# Patient Record
Sex: Male | Born: 1965 | Race: Black or African American | Hispanic: No | Marital: Single | Smoking: Never smoker
Health system: Southern US, Community
[De-identification: ages and names within clinical notes are randomized; demographics above are authoritative.]

## PROBLEM LIST (undated history)

## (undated) DIAGNOSIS — K625 Hemorrhage of anus and rectum: Secondary | ICD-10-CM

## (undated) DIAGNOSIS — H3090 Unspecified chorioretinal inflammation, unspecified eye: Secondary | ICD-10-CM

## (undated) DIAGNOSIS — E876 Hypokalemia: Secondary | ICD-10-CM

## (undated) DIAGNOSIS — L518 Other erythema multiforme: Secondary | ICD-10-CM

## (undated) DIAGNOSIS — G5760 Lesion of plantar nerve, unspecified lower limb: Secondary | ICD-10-CM

## (undated) DIAGNOSIS — B20 Human immunodeficiency virus [HIV] disease: Secondary | ICD-10-CM

## (undated) DIAGNOSIS — I10 Essential (primary) hypertension: Secondary | ICD-10-CM

## (undated) DIAGNOSIS — R55 Syncope and collapse: Secondary | ICD-10-CM

## (undated) DIAGNOSIS — IMO0002 Reserved for concepts with insufficient information to code with codable children: Secondary | ICD-10-CM

## (undated) DIAGNOSIS — A630 Anogenital (venereal) warts: Secondary | ICD-10-CM

## (undated) DIAGNOSIS — B1920 Unspecified viral hepatitis C without hepatic coma: Secondary | ICD-10-CM

## (undated) DIAGNOSIS — T7840XA Allergy, unspecified, initial encounter: Secondary | ICD-10-CM

## (undated) DIAGNOSIS — L519 Erythema multiforme, unspecified: Secondary | ICD-10-CM

## (undated) DIAGNOSIS — R933 Abnormal findings on diagnostic imaging of other parts of digestive tract: Secondary | ICD-10-CM

## (undated) HISTORY — DX: Syncope and collapse: R55

## (undated) HISTORY — DX: Hemorrhage of anus and rectum: K62.5

## (undated) HISTORY — DX: Unspecified chorioretinal inflammation, unspecified eye: H30.90

## (undated) HISTORY — DX: Erythema multiforme, unspecified: L51.9

## (undated) HISTORY — DX: Allergy, unspecified, initial encounter: T78.40XA

## (undated) HISTORY — DX: Abnormal findings on diagnostic imaging of other parts of digestive tract: R93.3

## (undated) HISTORY — DX: Hypokalemia: E87.6

## (undated) HISTORY — DX: Anogenital (venereal) warts: A63.0

## (undated) HISTORY — DX: Lesion of plantar nerve, unspecified lower limb: G57.60

## (undated) HISTORY — DX: Other erythema multiforme: L51.8

## (undated) HISTORY — DX: Human immunodeficiency virus (HIV) disease: B20

## (undated) HISTORY — DX: Essential (primary) hypertension: I10

## (undated) HISTORY — DX: Unspecified viral hepatitis C without hepatic coma: B19.20

## (undated) HISTORY — DX: Reserved for concepts with insufficient information to code with codable children: IMO0002

---

## 1999-08-23 ENCOUNTER — Emergency Department (HOSPITAL_COMMUNITY): Admission: EM | Admit: 1999-08-23 | Discharge: 1999-08-23 | Payer: Self-pay | Admitting: Emergency Medicine

## 2001-03-03 ENCOUNTER — Emergency Department (HOSPITAL_COMMUNITY): Admission: EM | Admit: 2001-03-03 | Discharge: 2001-03-03 | Payer: Self-pay | Admitting: Emergency Medicine

## 2001-03-11 ENCOUNTER — Emergency Department (HOSPITAL_COMMUNITY): Admission: EM | Admit: 2001-03-11 | Discharge: 2001-03-11 | Payer: Self-pay | Admitting: Emergency Medicine

## 2004-11-03 ENCOUNTER — Emergency Department (HOSPITAL_COMMUNITY): Admission: EM | Admit: 2004-11-03 | Discharge: 2004-11-03 | Payer: Self-pay | Admitting: Emergency Medicine

## 2004-11-15 ENCOUNTER — Emergency Department (HOSPITAL_COMMUNITY): Admission: EM | Admit: 2004-11-15 | Discharge: 2004-11-15 | Payer: Self-pay | Admitting: *Deleted

## 2005-08-20 ENCOUNTER — Emergency Department (HOSPITAL_COMMUNITY): Admission: EM | Admit: 2005-08-20 | Discharge: 2005-08-20 | Payer: Self-pay | Admitting: Emergency Medicine

## 2005-09-05 ENCOUNTER — Emergency Department (HOSPITAL_COMMUNITY): Admission: EM | Admit: 2005-09-05 | Discharge: 2005-09-05 | Payer: Self-pay | Admitting: Family Medicine

## 2005-09-19 ENCOUNTER — Emergency Department (HOSPITAL_COMMUNITY): Admission: EM | Admit: 2005-09-19 | Discharge: 2005-09-19 | Payer: Self-pay | Admitting: Family Medicine

## 2005-09-24 ENCOUNTER — Emergency Department (HOSPITAL_COMMUNITY): Admission: EM | Admit: 2005-09-24 | Discharge: 2005-09-24 | Payer: Self-pay | Admitting: Emergency Medicine

## 2005-09-29 ENCOUNTER — Emergency Department (HOSPITAL_COMMUNITY): Admission: EM | Admit: 2005-09-29 | Discharge: 2005-09-29 | Payer: Self-pay | Admitting: Family Medicine

## 2005-09-30 ENCOUNTER — Ambulatory Visit: Payer: Self-pay | Admitting: Internal Medicine

## 2005-10-03 ENCOUNTER — Emergency Department (HOSPITAL_COMMUNITY): Admission: EM | Admit: 2005-10-03 | Discharge: 2005-10-03 | Payer: Self-pay | Admitting: Emergency Medicine

## 2005-10-04 ENCOUNTER — Ambulatory Visit: Payer: Self-pay | Admitting: Gastroenterology

## 2005-10-08 ENCOUNTER — Encounter (INDEPENDENT_AMBULATORY_CARE_PROVIDER_SITE_OTHER): Payer: Self-pay | Admitting: *Deleted

## 2005-10-08 ENCOUNTER — Encounter (INDEPENDENT_AMBULATORY_CARE_PROVIDER_SITE_OTHER): Payer: Self-pay | Admitting: Specialist

## 2005-10-08 ENCOUNTER — Ambulatory Visit: Payer: Self-pay | Admitting: Gastroenterology

## 2005-10-21 ENCOUNTER — Ambulatory Visit: Payer: Self-pay | Admitting: Internal Medicine

## 2005-11-06 ENCOUNTER — Ambulatory Visit: Payer: Self-pay | Admitting: Gastroenterology

## 2005-11-09 ENCOUNTER — Emergency Department (HOSPITAL_COMMUNITY): Admission: EM | Admit: 2005-11-09 | Discharge: 2005-11-09 | Payer: Self-pay | Admitting: Family Medicine

## 2005-11-09 ENCOUNTER — Emergency Department (HOSPITAL_COMMUNITY): Admission: EM | Admit: 2005-11-09 | Discharge: 2005-11-09 | Payer: Self-pay | Admitting: Emergency Medicine

## 2005-11-11 ENCOUNTER — Emergency Department (HOSPITAL_COMMUNITY): Admission: EM | Admit: 2005-11-11 | Discharge: 2005-11-11 | Payer: Self-pay | Admitting: *Deleted

## 2005-12-24 ENCOUNTER — Ambulatory Visit: Payer: Self-pay | Admitting: Gastroenterology

## 2006-01-17 ENCOUNTER — Encounter (INDEPENDENT_AMBULATORY_CARE_PROVIDER_SITE_OTHER): Payer: Self-pay | Admitting: *Deleted

## 2006-01-17 ENCOUNTER — Ambulatory Visit: Payer: Self-pay | Admitting: Gastroenterology

## 2006-02-06 DIAGNOSIS — B20 Human immunodeficiency virus [HIV] disease: Secondary | ICD-10-CM | POA: Insufficient documentation

## 2006-02-12 ENCOUNTER — Inpatient Hospital Stay (HOSPITAL_COMMUNITY): Admission: EM | Admit: 2006-02-12 | Discharge: 2006-02-17 | Payer: Self-pay | Admitting: *Deleted

## 2006-02-12 ENCOUNTER — Ambulatory Visit: Payer: Self-pay | Admitting: Gastroenterology

## 2006-02-12 ENCOUNTER — Encounter (INDEPENDENT_AMBULATORY_CARE_PROVIDER_SITE_OTHER): Payer: Self-pay | Admitting: *Deleted

## 2006-02-12 ENCOUNTER — Ambulatory Visit: Payer: Self-pay | Admitting: Infectious Diseases

## 2006-02-20 ENCOUNTER — Emergency Department (HOSPITAL_COMMUNITY): Admission: EM | Admit: 2006-02-20 | Discharge: 2006-02-20 | Payer: Self-pay | Admitting: Emergency Medicine

## 2006-03-04 ENCOUNTER — Encounter (INDEPENDENT_AMBULATORY_CARE_PROVIDER_SITE_OTHER): Payer: Self-pay | Admitting: *Deleted

## 2006-03-04 ENCOUNTER — Ambulatory Visit: Payer: Self-pay | Admitting: Infectious Diseases

## 2006-03-04 ENCOUNTER — Encounter: Admission: RE | Admit: 2006-03-04 | Discharge: 2006-03-04 | Payer: Self-pay | Admitting: Infectious Diseases

## 2006-03-04 LAB — CONVERTED CEMR LAB
CD4 Count: 10 microliters
CD4 T Cell Abs: 10

## 2006-03-26 ENCOUNTER — Ambulatory Visit: Payer: Self-pay | Admitting: Infectious Diseases

## 2006-04-09 ENCOUNTER — Ambulatory Visit: Payer: Self-pay | Admitting: Infectious Diseases

## 2006-04-23 ENCOUNTER — Ambulatory Visit: Payer: Self-pay | Admitting: Infectious Diseases

## 2006-05-27 ENCOUNTER — Ambulatory Visit: Payer: Self-pay | Admitting: Gastroenterology

## 2006-06-09 ENCOUNTER — Ambulatory Visit: Payer: Self-pay | Admitting: Infectious Diseases

## 2006-06-09 ENCOUNTER — Encounter (INDEPENDENT_AMBULATORY_CARE_PROVIDER_SITE_OTHER): Payer: Self-pay | Admitting: *Deleted

## 2006-06-09 ENCOUNTER — Encounter: Admission: RE | Admit: 2006-06-09 | Discharge: 2006-06-09 | Payer: Self-pay | Admitting: Infectious Diseases

## 2006-06-09 LAB — CONVERTED CEMR LAB: CD4 Count: 130 microliters

## 2006-09-08 ENCOUNTER — Ambulatory Visit: Payer: Self-pay | Admitting: Infectious Diseases

## 2006-09-08 ENCOUNTER — Encounter (INDEPENDENT_AMBULATORY_CARE_PROVIDER_SITE_OTHER): Payer: Self-pay | Admitting: *Deleted

## 2006-09-08 ENCOUNTER — Encounter: Admission: RE | Admit: 2006-09-08 | Discharge: 2006-09-08 | Payer: Self-pay | Admitting: Infectious Diseases

## 2006-09-08 LAB — CONVERTED CEMR LAB
ALT: 40 units/L (ref 0–40)
AST: 32 units/L (ref 0–37)
Albumin: 4.2 g/dL (ref 3.5–5.2)
Alkaline Phosphatase: 112 units/L (ref 39–117)
BUN: 10 mg/dL (ref 6–23)
CD4 Count: 60 microliters
CO2: 22 meq/L (ref 19–32)
Calcium: 9.2 mg/dL (ref 8.4–10.5)
Chloride: 106 meq/L (ref 96–112)
Creatinine, Ser: 0.79 mg/dL (ref 0.40–1.50)
Glucose, Bld: 86 mg/dL (ref 70–99)
HCT: 38.9 % — ABNORMAL LOW (ref 39.0–52.0)
HIV 1 RNA Quant: 47100 copies/mL
HIV 1 RNA Quant: 47100 copies/mL — ABNORMAL HIGH (ref <50)
HIV-1 RNA Quant, Log: 4.67 — ABNORMAL HIGH (ref <1.70)
Hemoglobin: 12.8 g/dL — ABNORMAL LOW (ref 13.0–17.0)
Leukocyte count, blood: 2.5 10*9/L — ABNORMAL LOW (ref 4.0–10.5)
MCHC: 32.9 g/dL (ref 30.0–36.0)
MCV: 87 fL (ref 78.0–100.0)
Platelets: 276 10*3/uL (ref 150–400)
Potassium: 3.9 meq/L (ref 3.5–5.3)
RBC: 4.47 M/uL (ref 4.22–5.81)
RDW: 17.6 % — ABNORMAL HIGH (ref 11.5–14.0)
Sodium: 141 meq/L (ref 135–145)
Total Bilirubin: 0.3 mg/dL (ref 0.3–1.2)
Total Protein: 7.6 g/dL (ref 6.0–8.3)

## 2006-09-24 ENCOUNTER — Encounter: Admission: RE | Admit: 2006-09-24 | Discharge: 2006-09-24 | Payer: Self-pay | Admitting: Infectious Diseases

## 2006-09-24 ENCOUNTER — Encounter (INDEPENDENT_AMBULATORY_CARE_PROVIDER_SITE_OTHER): Payer: Self-pay | Admitting: *Deleted

## 2006-09-24 ENCOUNTER — Ambulatory Visit: Payer: Self-pay | Admitting: Infectious Diseases

## 2006-09-24 LAB — CONVERTED CEMR LAB
CD4 Count: 90 microliters
HIV 1 RNA Quant: 63600 copies/mL
HIV 1 RNA Quant: 63600 copies/mL — ABNORMAL HIGH (ref <50)
HIV-1 RNA Quant, Log: 4.8 — ABNORMAL HIGH (ref <1.70)

## 2006-09-27 DIAGNOSIS — Z9119 Patient's noncompliance with other medical treatment and regimen: Secondary | ICD-10-CM

## 2006-09-27 DIAGNOSIS — A630 Anogenital (venereal) warts: Secondary | ICD-10-CM | POA: Insufficient documentation

## 2006-09-27 DIAGNOSIS — B1920 Unspecified viral hepatitis C without hepatic coma: Secondary | ICD-10-CM

## 2006-10-15 ENCOUNTER — Ambulatory Visit: Payer: Self-pay | Admitting: Infectious Diseases

## 2006-12-29 ENCOUNTER — Encounter: Admission: RE | Admit: 2006-12-29 | Discharge: 2006-12-29 | Payer: Self-pay | Admitting: Infectious Diseases

## 2006-12-29 ENCOUNTER — Encounter (INDEPENDENT_AMBULATORY_CARE_PROVIDER_SITE_OTHER): Payer: Self-pay | Admitting: *Deleted

## 2006-12-29 ENCOUNTER — Ambulatory Visit (HOSPITAL_COMMUNITY): Admission: RE | Admit: 2006-12-29 | Discharge: 2006-12-29 | Payer: Self-pay | Admitting: Infectious Diseases

## 2006-12-29 ENCOUNTER — Ambulatory Visit: Payer: Self-pay | Admitting: Infectious Diseases

## 2006-12-29 LAB — CONVERTED CEMR LAB
ALT: 50 units/L (ref 0–53)
AST: 30 units/L (ref 0–37)
Albumin: 4.6 g/dL (ref 3.5–5.2)
Alkaline Phosphatase: 105 units/L (ref 39–117)
BUN: 11 mg/dL (ref 6–23)
CO2: 23 meq/L (ref 19–32)
Calcium: 9.4 mg/dL (ref 8.4–10.5)
Chloride: 106 meq/L (ref 96–112)
Creatinine, Ser: 0.85 mg/dL (ref 0.40–1.50)
Glucose, Bld: 88 mg/dL (ref 70–99)
HCT: 39.3 % (ref 39.0–52.0)
HIV 1 RNA Quant: 170 copies/mL
HIV 1 RNA Quant: 170 copies/mL — ABNORMAL HIGH (ref <50)
HIV-1 RNA Quant, Log: 2.23 — ABNORMAL HIGH (ref <1.70)
Hemoglobin: 13.4 g/dL (ref 13.0–17.0)
MCHC: 34.1 g/dL (ref 30.0–36.0)
MCV: 99.7 fL (ref 78.0–100.0)
Platelets: 329 10*3/uL (ref 150–400)
Potassium: 3.8 meq/L (ref 3.5–5.3)
RBC: 3.94 M/uL — ABNORMAL LOW (ref 4.22–5.81)
RDW: 19.5 % — ABNORMAL HIGH (ref 11.5–14.0)
Sodium: 139 meq/L (ref 135–145)
TSH: 1.564 microintl units/mL (ref 0.350–5.50)
Total Bilirubin: 1.3 mg/dL — ABNORMAL HIGH (ref 0.3–1.2)
Total Protein: 7.9 g/dL (ref 6.0–8.3)
WBC: 5 10*3/uL (ref 4.0–10.5)

## 2007-01-12 ENCOUNTER — Encounter (INDEPENDENT_AMBULATORY_CARE_PROVIDER_SITE_OTHER): Payer: Self-pay | Admitting: *Deleted

## 2007-01-12 ENCOUNTER — Encounter: Payer: Self-pay | Admitting: Infectious Diseases

## 2007-01-12 ENCOUNTER — Ambulatory Visit: Payer: Self-pay | Admitting: Infectious Diseases

## 2007-01-12 DIAGNOSIS — R55 Syncope and collapse: Secondary | ICD-10-CM

## 2007-01-12 LAB — CONVERTED CEMR LAB: HCV Quantitative: 2520000 intl units/mL

## 2007-01-25 ENCOUNTER — Encounter (INDEPENDENT_AMBULATORY_CARE_PROVIDER_SITE_OTHER): Payer: Self-pay | Admitting: *Deleted

## 2007-01-29 ENCOUNTER — Telehealth (INDEPENDENT_AMBULATORY_CARE_PROVIDER_SITE_OTHER): Payer: Self-pay | Admitting: Infectious Diseases

## 2007-02-09 ENCOUNTER — Telehealth: Payer: Self-pay | Admitting: Infectious Diseases

## 2007-02-24 ENCOUNTER — Telehealth: Payer: Self-pay | Admitting: Infectious Diseases

## 2007-03-09 ENCOUNTER — Emergency Department (HOSPITAL_COMMUNITY): Admission: EM | Admit: 2007-03-09 | Discharge: 2007-03-09 | Payer: Self-pay | Admitting: Emergency Medicine

## 2007-03-10 ENCOUNTER — Telehealth: Payer: Self-pay | Admitting: Infectious Diseases

## 2007-03-25 ENCOUNTER — Ambulatory Visit: Payer: Self-pay | Admitting: Infectious Diseases

## 2007-04-09 ENCOUNTER — Telehealth: Payer: Self-pay | Admitting: Infectious Diseases

## 2007-05-08 ENCOUNTER — Telehealth: Payer: Self-pay | Admitting: Infectious Diseases

## 2007-06-04 ENCOUNTER — Encounter: Payer: Self-pay | Admitting: Infectious Diseases

## 2007-06-09 ENCOUNTER — Telehealth: Payer: Self-pay | Admitting: Infectious Diseases

## 2007-06-22 ENCOUNTER — Telehealth: Payer: Self-pay | Admitting: Infectious Diseases

## 2007-07-08 ENCOUNTER — Telehealth: Payer: Self-pay | Admitting: Infectious Diseases

## 2007-08-11 ENCOUNTER — Telehealth: Payer: Self-pay | Admitting: Infectious Diseases

## 2007-09-08 ENCOUNTER — Telehealth: Payer: Self-pay | Admitting: Infectious Diseases

## 2007-09-08 ENCOUNTER — Encounter: Admission: RE | Admit: 2007-09-08 | Discharge: 2007-09-08 | Payer: Self-pay | Admitting: Infectious Diseases

## 2007-09-08 ENCOUNTER — Ambulatory Visit: Payer: Self-pay | Admitting: Infectious Diseases

## 2007-09-08 LAB — CONVERTED CEMR LAB
ALT: 41 units/L (ref 0–53)
AST: 31 units/L (ref 0–37)
Albumin: 4.1 g/dL (ref 3.5–5.2)
Alkaline Phosphatase: 71 units/L (ref 39–117)
BUN: 8 mg/dL (ref 6–23)
Basophils Absolute: 0 10*3/uL (ref 0.0–0.1)
Basophils Relative: 0 % (ref 0–1)
CO2: 23 meq/L (ref 19–32)
Calcium: 8.7 mg/dL (ref 8.4–10.5)
Chloride: 108 meq/L (ref 96–112)
Creatinine, Ser: 0.79 mg/dL (ref 0.40–1.50)
Eosinophils Absolute: 0 10*3/uL (ref 0.0–0.7)
Eosinophils Relative: 1 % (ref 0–5)
Glucose, Bld: 91 mg/dL (ref 70–99)
HCT: 40.8 % (ref 39.0–52.0)
HIV 1 RNA Quant: 179000 copies/mL — ABNORMAL HIGH (ref <50)
HIV-1 RNA Quant, Log: 5.25 — ABNORMAL HIGH (ref <1.70)
Hemoglobin: 14.4 g/dL (ref 13.0–17.0)
Lymphocytes Relative: 54 % — ABNORMAL HIGH (ref 12–46)
Lymphs Abs: 2.4 10*3/uL (ref 0.7–3.3)
MCHC: 35.3 g/dL (ref 30.0–36.0)
MCV: 100.7 fL — ABNORMAL HIGH (ref 78.0–100.0)
Monocytes Absolute: 0.6 10*3/uL (ref 0.2–0.7)
Monocytes Relative: 13 % — ABNORMAL HIGH (ref 3–11)
Neutro Abs: 1.4 10*3/uL — ABNORMAL LOW (ref 1.7–7.7)
Neutrophils Relative %: 32 % — ABNORMAL LOW (ref 43–77)
Platelets: 196 10*3/uL (ref 150–400)
Potassium: 3.4 meq/L — ABNORMAL LOW (ref 3.5–5.3)
RBC: 4.05 M/uL — ABNORMAL LOW (ref 4.22–5.81)
RDW: 12.6 % (ref 11.5–14.0)
Sodium: 143 meq/L (ref 135–145)
Total Bilirubin: 1.9 mg/dL — ABNORMAL HIGH (ref 0.3–1.2)
Total Protein: 7.5 g/dL (ref 6.0–8.3)
WBC: 4.4 10*3/uL (ref 4.0–10.5)

## 2007-09-11 ENCOUNTER — Encounter: Payer: Self-pay | Admitting: Infectious Diseases

## 2007-09-11 LAB — CONVERTED CEMR LAB

## 2007-09-21 ENCOUNTER — Telehealth: Payer: Self-pay | Admitting: Infectious Diseases

## 2007-10-05 ENCOUNTER — Telehealth: Payer: Self-pay | Admitting: Infectious Diseases

## 2007-11-05 ENCOUNTER — Telehealth: Payer: Self-pay | Admitting: Infectious Diseases

## 2007-11-26 ENCOUNTER — Encounter (INDEPENDENT_AMBULATORY_CARE_PROVIDER_SITE_OTHER): Payer: Self-pay | Admitting: *Deleted

## 2007-12-09 ENCOUNTER — Telehealth: Payer: Self-pay | Admitting: Infectious Diseases

## 2007-12-21 ENCOUNTER — Telehealth: Payer: Self-pay | Admitting: Infectious Diseases

## 2007-12-25 ENCOUNTER — Encounter (INDEPENDENT_AMBULATORY_CARE_PROVIDER_SITE_OTHER): Payer: Self-pay | Admitting: *Deleted

## 2008-01-07 ENCOUNTER — Telehealth: Payer: Self-pay | Admitting: Infectious Diseases

## 2008-01-20 ENCOUNTER — Telehealth (INDEPENDENT_AMBULATORY_CARE_PROVIDER_SITE_OTHER): Payer: Self-pay | Admitting: *Deleted

## 2008-02-02 ENCOUNTER — Telehealth: Payer: Self-pay | Admitting: Infectious Diseases

## 2008-03-04 ENCOUNTER — Telehealth (INDEPENDENT_AMBULATORY_CARE_PROVIDER_SITE_OTHER): Payer: Self-pay | Admitting: *Deleted

## 2008-03-08 ENCOUNTER — Encounter: Admission: RE | Admit: 2008-03-08 | Discharge: 2008-03-08 | Payer: Self-pay | Admitting: Infectious Diseases

## 2008-03-08 ENCOUNTER — Encounter: Payer: Self-pay | Admitting: Internal Medicine

## 2008-03-08 ENCOUNTER — Ambulatory Visit: Payer: Self-pay | Admitting: Infectious Diseases

## 2008-03-08 LAB — CONVERTED CEMR LAB
HIV 1 RNA Quant: 214000 copies/mL — ABNORMAL HIGH (ref <50)
HIV-1 RNA Quant, Log: 5.33 — ABNORMAL HIGH (ref <1.70)

## 2008-03-09 LAB — CONVERTED CEMR LAB
ALT: 26 units/L (ref 0–53)
AST: 27 units/L (ref 0–37)
Albumin: 4.4 g/dL (ref 3.5–5.2)
Alkaline Phosphatase: 71 units/L (ref 39–117)
BUN: 7 mg/dL (ref 6–23)
Basophils Absolute: 0 10*3/uL (ref 0.0–0.1)
Basophils Relative: 0 % (ref 0–1)
CO2: 22 meq/L (ref 19–32)
Calcium: 8.7 mg/dL (ref 8.4–10.5)
Chloride: 108 meq/L (ref 96–112)
Cholesterol: 113 mg/dL (ref 0–200)
Creatinine, Ser: 0.76 mg/dL (ref 0.40–1.50)
Eosinophils Absolute: 0.4 10*3/uL (ref 0.0–0.7)
Eosinophils Relative: 9 % — ABNORMAL HIGH (ref 0–5)
Glucose, Bld: 114 mg/dL — ABNORMAL HIGH (ref 70–99)
HCT: 42.1 % (ref 39.0–52.0)
HDL: 34 mg/dL — ABNORMAL LOW (ref >39)
Hemoglobin: 15 g/dL (ref 13.0–17.0)
LDL Cholesterol: 29 mg/dL (ref 0–99)
Lymphocytes Relative: 47 % — ABNORMAL HIGH (ref 12–46)
Lymphs Abs: 2.1 10*3/uL (ref 0.7–4.0)
MCHC: 35.6 g/dL (ref 30.0–36.0)
MCV: 95 fL (ref 78.0–100.0)
Monocytes Absolute: 0.5 10*3/uL (ref 0.1–1.0)
Monocytes Relative: 10 % (ref 3–12)
Neutro Abs: 1.5 10*3/uL — ABNORMAL LOW (ref 1.7–7.7)
Neutrophils Relative %: 34 % — ABNORMAL LOW (ref 43–77)
Platelets: 247 10*3/uL (ref 150–400)
Potassium: 3.5 meq/L (ref 3.5–5.3)
RBC: 4.43 M/uL (ref 4.22–5.81)
RDW: 15.2 % (ref 11.5–15.5)
Sodium: 144 meq/L (ref 135–145)
Total Bilirubin: 2.9 mg/dL — ABNORMAL HIGH (ref 0.3–1.2)
Total CHOL/HDL Ratio: 3.3
Total Protein: 7.6 g/dL (ref 6.0–8.3)
Triglycerides: 252 mg/dL — ABNORMAL HIGH (ref <150)
VLDL: 50 mg/dL — ABNORMAL HIGH (ref 0–40)
WBC: 4.5 10*3/uL (ref 4.0–10.5)

## 2008-03-23 ENCOUNTER — Ambulatory Visit: Payer: Self-pay | Admitting: Infectious Diseases

## 2008-03-23 DIAGNOSIS — I10 Essential (primary) hypertension: Secondary | ICD-10-CM | POA: Insufficient documentation

## 2008-03-24 ENCOUNTER — Encounter: Payer: Self-pay | Admitting: Internal Medicine

## 2008-03-24 LAB — CONVERTED CEMR LAB

## 2008-05-02 ENCOUNTER — Telehealth (INDEPENDENT_AMBULATORY_CARE_PROVIDER_SITE_OTHER): Payer: Self-pay | Admitting: *Deleted

## 2008-05-30 ENCOUNTER — Ambulatory Visit: Payer: Self-pay | Admitting: Infectious Diseases

## 2008-05-30 DIAGNOSIS — L255 Unspecified contact dermatitis due to plants, except food: Secondary | ICD-10-CM

## 2008-05-30 DIAGNOSIS — G5760 Lesion of plantar nerve, unspecified lower limb: Secondary | ICD-10-CM | POA: Insufficient documentation

## 2008-05-30 LAB — CONVERTED CEMR LAB
BUN: 9 mg/dL (ref 6–23)
CO2: 22 meq/L (ref 19–32)
Calcium: 9.7 mg/dL (ref 8.4–10.5)
Chloride: 107 meq/L (ref 96–112)
Creatinine, Ser: 0.62 mg/dL (ref 0.40–1.50)
Glucose, Bld: 70 mg/dL (ref 70–99)
Potassium: 3.6 meq/L (ref 3.5–5.3)
Sodium: 143 meq/L (ref 135–145)

## 2008-05-31 ENCOUNTER — Telehealth (INDEPENDENT_AMBULATORY_CARE_PROVIDER_SITE_OTHER): Payer: Self-pay | Admitting: *Deleted

## 2008-06-13 ENCOUNTER — Ambulatory Visit: Payer: Self-pay | Admitting: Infectious Diseases

## 2008-06-16 ENCOUNTER — Telehealth: Payer: Self-pay

## 2008-06-16 ENCOUNTER — Ambulatory Visit: Payer: Self-pay | Admitting: Infectious Diseases

## 2008-06-16 LAB — CONVERTED CEMR LAB
BUN: 8 mg/dL (ref 6–23)
CO2: 20 meq/L (ref 19–32)
Calcium: 8.9 mg/dL (ref 8.4–10.5)
Chloride: 109 meq/L (ref 96–112)
Creatinine, Ser: 0.67 mg/dL (ref 0.40–1.50)
Glucose, Bld: 90 mg/dL (ref 70–99)
Potassium: 3.6 meq/L (ref 3.5–5.3)
Sodium: 142 meq/L (ref 135–145)

## 2008-06-29 ENCOUNTER — Telehealth (INDEPENDENT_AMBULATORY_CARE_PROVIDER_SITE_OTHER): Payer: Self-pay | Admitting: *Deleted

## 2008-08-09 ENCOUNTER — Encounter: Payer: Self-pay | Admitting: Infectious Diseases

## 2008-08-09 LAB — CONVERTED CEMR LAB: Hep A Total Ab: NEGATIVE

## 2008-08-23 ENCOUNTER — Telehealth (INDEPENDENT_AMBULATORY_CARE_PROVIDER_SITE_OTHER): Payer: Self-pay | Admitting: *Deleted

## 2008-09-14 ENCOUNTER — Ambulatory Visit: Payer: Self-pay | Admitting: Infectious Diseases

## 2008-09-14 LAB — CONVERTED CEMR LAB
ALT: 42 units/L (ref 0–53)
AST: 44 units/L — ABNORMAL HIGH (ref 0–37)
Albumin: 4.2 g/dL (ref 3.5–5.2)
Alkaline Phosphatase: 62 units/L (ref 39–117)
BUN: 8 mg/dL (ref 6–23)
Basophils Absolute: 0 10*3/uL (ref 0.0–0.1)
Basophils Relative: 0 % (ref 0–1)
CO2: 24 meq/L (ref 19–32)
Calcium: 8.7 mg/dL (ref 8.4–10.5)
Chloride: 108 meq/L (ref 96–112)
Creatinine, Ser: 0.67 mg/dL (ref 0.40–1.50)
Eosinophils Absolute: 0.1 10*3/uL (ref 0.0–0.7)
Eosinophils Relative: 4 % (ref 0–5)
Glucose, Bld: 87 mg/dL (ref 70–99)
HCT: 39.4 % (ref 39.0–52.0)
HIV 1 RNA Quant: 548000 copies/mL — ABNORMAL HIGH (ref <50)
HIV-1 RNA Quant, Log: 5.74 — ABNORMAL HIGH (ref <1.70)
Hemoglobin: 13.3 g/dL (ref 13.0–17.0)
Lymphocytes Relative: 30 % (ref 12–46)
Lymphs Abs: 0.9 10*3/uL (ref 0.7–4.0)
MCHC: 33.8 g/dL (ref 30.0–36.0)
MCV: 97.5 fL (ref 78.0–100.0)
Monocytes Absolute: 0.5 10*3/uL (ref 0.1–1.0)
Monocytes Relative: 17 % — ABNORMAL HIGH (ref 3–12)
Neutro Abs: 1.5 10*3/uL — ABNORMAL LOW (ref 1.7–7.7)
Neutrophils Relative %: 49 % (ref 43–77)
Platelets: 209 10*3/uL (ref 150–400)
Potassium: 3.8 meq/L (ref 3.5–5.3)
RBC: 4.04 M/uL — ABNORMAL LOW (ref 4.22–5.81)
RDW: 13.8 % (ref 11.5–15.5)
Sodium: 142 meq/L (ref 135–145)
Total Bilirubin: 1.2 mg/dL (ref 0.3–1.2)
Total Protein: 7.3 g/dL (ref 6.0–8.3)
WBC: 3 10*3/uL — ABNORMAL LOW (ref 4.0–10.5)

## 2008-09-23 ENCOUNTER — Telehealth (INDEPENDENT_AMBULATORY_CARE_PROVIDER_SITE_OTHER): Payer: Self-pay | Admitting: *Deleted

## 2008-09-28 ENCOUNTER — Ambulatory Visit: Payer: Self-pay | Admitting: Infectious Diseases

## 2008-10-25 ENCOUNTER — Telehealth (INDEPENDENT_AMBULATORY_CARE_PROVIDER_SITE_OTHER): Payer: Self-pay | Admitting: *Deleted

## 2008-11-24 ENCOUNTER — Telehealth (INDEPENDENT_AMBULATORY_CARE_PROVIDER_SITE_OTHER): Payer: Self-pay | Admitting: *Deleted

## 2008-12-15 ENCOUNTER — Ambulatory Visit: Payer: Self-pay | Admitting: Infectious Diseases

## 2008-12-15 ENCOUNTER — Encounter: Payer: Self-pay | Admitting: Infectious Diseases

## 2008-12-15 LAB — CONVERTED CEMR LAB
ALT: 28 units/L (ref 0–53)
AST: 38 units/L — ABNORMAL HIGH (ref 0–37)
Albumin: 4 g/dL (ref 3.5–5.2)
Alkaline Phosphatase: 57 units/L (ref 39–117)
BUN: 10 mg/dL (ref 6–23)
Basophils Absolute: 0 10*3/uL (ref 0.0–0.1)
Basophils Relative: 0 % (ref 0–1)
CO2: 23 meq/L (ref 19–32)
Calcium: 8.8 mg/dL (ref 8.4–10.5)
Chloride: 105 meq/L (ref 96–112)
Creatinine, Ser: 0.65 mg/dL (ref 0.40–1.50)
Eosinophils Absolute: 0.1 10*3/uL (ref 0.0–0.7)
Eosinophils Relative: 2 % (ref 0–5)
Glucose, Bld: 98 mg/dL (ref 70–99)
HCT: 34.8 % — ABNORMAL LOW (ref 39.0–52.0)
HIV 1 RNA Quant: 604000 copies/mL — ABNORMAL HIGH (ref <48)
HIV-1 RNA Quant, Log: 5.78 — ABNORMAL HIGH (ref <1.68)
Hemoglobin: 12 g/dL — ABNORMAL LOW (ref 13.0–17.0)
Lymphocytes Relative: 24 % (ref 12–46)
Lymphs Abs: 0.9 10*3/uL (ref 0.7–4.0)
MCHC: 34.5 g/dL (ref 30.0–36.0)
MCV: 97.8 fL (ref 78.0–100.0)
Monocytes Absolute: 0.5 10*3/uL (ref 0.1–1.0)
Monocytes Relative: 13 % — ABNORMAL HIGH (ref 3–12)
Neutro Abs: 2.2 10*3/uL (ref 1.7–7.7)
Neutrophils Relative %: 61 % (ref 43–77)
Platelets: 253 10*3/uL (ref 150–400)
Potassium: 3 meq/L — ABNORMAL LOW (ref 3.5–5.3)
RBC: 3.56 M/uL — ABNORMAL LOW (ref 4.22–5.81)
RDW: 15.2 % (ref 11.5–15.5)
Sodium: 144 meq/L (ref 135–145)
Total Bilirubin: 1.2 mg/dL (ref 0.3–1.2)
Total Protein: 7.3 g/dL (ref 6.0–8.3)
WBC: 3.6 10*3/uL — ABNORMAL LOW (ref 4.0–10.5)

## 2008-12-22 ENCOUNTER — Ambulatory Visit: Payer: Self-pay | Admitting: Infectious Disease

## 2008-12-23 ENCOUNTER — Encounter: Payer: Self-pay | Admitting: Gastroenterology

## 2008-12-23 ENCOUNTER — Ambulatory Visit: Payer: Self-pay | Admitting: Internal Medicine

## 2008-12-23 ENCOUNTER — Inpatient Hospital Stay (HOSPITAL_COMMUNITY): Admission: EM | Admit: 2008-12-23 | Discharge: 2008-12-25 | Payer: Self-pay | Admitting: Emergency Medicine

## 2008-12-25 ENCOUNTER — Encounter (INDEPENDENT_AMBULATORY_CARE_PROVIDER_SITE_OTHER): Payer: Self-pay | Admitting: Internal Medicine

## 2008-12-25 DIAGNOSIS — E876 Hypokalemia: Secondary | ICD-10-CM

## 2008-12-25 DIAGNOSIS — K625 Hemorrhage of anus and rectum: Secondary | ICD-10-CM

## 2008-12-26 ENCOUNTER — Telehealth (INDEPENDENT_AMBULATORY_CARE_PROVIDER_SITE_OTHER): Payer: Self-pay | Admitting: *Deleted

## 2009-01-04 ENCOUNTER — Ambulatory Visit: Payer: Self-pay | Admitting: Infectious Diseases

## 2009-01-04 LAB — CONVERTED CEMR LAB
Basophils Absolute: 0 10*3/uL (ref 0.0–0.1)
Basophils Relative: 0 % (ref 0–1)
Eosinophils Absolute: 0.5 10*3/uL (ref 0.0–0.7)
Eosinophils Relative: 8 % — ABNORMAL HIGH (ref 0–5)
HCT: 33.5 % — ABNORMAL LOW (ref 39.0–52.0)
Hemoglobin: 11.3 g/dL — ABNORMAL LOW (ref 13.0–17.0)
Lymphocytes Relative: 37 % (ref 12–46)
Lymphs Abs: 2 10*3/uL (ref 0.7–4.0)
MCHC: 33.7 g/dL (ref 30.0–36.0)
MCV: 105 fL — ABNORMAL HIGH (ref 78.0–100.0)
Monocytes Absolute: 0.5 10*3/uL (ref 0.1–1.0)
Monocytes Relative: 10 % (ref 3–12)
Neutro Abs: 2.4 10*3/uL (ref 1.7–7.7)
Neutrophils Relative %: 44 % (ref 43–77)
Platelets: 292 10*3/uL (ref 150–400)
RBC: 3.19 M/uL — ABNORMAL LOW (ref 4.22–5.81)
RDW: 18.5 % — ABNORMAL HIGH (ref 11.5–15.5)
WBC: 5.4 10*3/uL (ref 4.0–10.5)

## 2009-01-05 ENCOUNTER — Telehealth (INDEPENDENT_AMBULATORY_CARE_PROVIDER_SITE_OTHER): Payer: Self-pay | Admitting: *Deleted

## 2009-01-05 ENCOUNTER — Encounter (INDEPENDENT_AMBULATORY_CARE_PROVIDER_SITE_OTHER): Payer: Self-pay | Admitting: *Deleted

## 2009-01-13 ENCOUNTER — Ambulatory Visit: Payer: Self-pay | Admitting: Gastroenterology

## 2009-01-13 DIAGNOSIS — R933 Abnormal findings on diagnostic imaging of other parts of digestive tract: Secondary | ICD-10-CM

## 2009-01-20 ENCOUNTER — Ambulatory Visit: Payer: Self-pay | Admitting: Gastroenterology

## 2009-01-20 ENCOUNTER — Encounter: Payer: Self-pay | Admitting: Gastroenterology

## 2009-01-23 ENCOUNTER — Telehealth (INDEPENDENT_AMBULATORY_CARE_PROVIDER_SITE_OTHER): Payer: Self-pay | Admitting: *Deleted

## 2009-01-25 ENCOUNTER — Telehealth: Payer: Self-pay | Admitting: Infectious Diseases

## 2009-01-25 ENCOUNTER — Encounter: Payer: Self-pay | Admitting: Gastroenterology

## 2009-02-06 ENCOUNTER — Ambulatory Visit: Payer: Self-pay | Admitting: Infectious Diseases

## 2009-02-06 DIAGNOSIS — A630 Anogenital (venereal) warts: Secondary | ICD-10-CM | POA: Insufficient documentation

## 2009-02-06 LAB — CONVERTED CEMR LAB
HIV 1 RNA Quant: 37300 copies/mL — ABNORMAL HIGH (ref <48)
HIV-1 RNA Quant, Log: 4.57 — ABNORMAL HIGH (ref <1.68)

## 2009-02-20 ENCOUNTER — Telehealth (INDEPENDENT_AMBULATORY_CARE_PROVIDER_SITE_OTHER): Payer: Self-pay | Admitting: *Deleted

## 2009-03-06 ENCOUNTER — Emergency Department (HOSPITAL_COMMUNITY): Admission: EM | Admit: 2009-03-06 | Discharge: 2009-03-06 | Payer: Self-pay | Admitting: Emergency Medicine

## 2009-03-06 ENCOUNTER — Encounter: Payer: Self-pay | Admitting: Infectious Diseases

## 2009-05-01 ENCOUNTER — Telehealth (INDEPENDENT_AMBULATORY_CARE_PROVIDER_SITE_OTHER): Payer: Self-pay | Admitting: *Deleted

## 2009-05-08 ENCOUNTER — Ambulatory Visit: Payer: Self-pay | Admitting: Infectious Diseases

## 2009-05-15 ENCOUNTER — Telehealth (INDEPENDENT_AMBULATORY_CARE_PROVIDER_SITE_OTHER): Payer: Self-pay | Admitting: *Deleted

## 2009-05-26 ENCOUNTER — Telehealth (INDEPENDENT_AMBULATORY_CARE_PROVIDER_SITE_OTHER): Payer: Self-pay | Admitting: *Deleted

## 2009-06-06 ENCOUNTER — Encounter (INDEPENDENT_AMBULATORY_CARE_PROVIDER_SITE_OTHER): Payer: Self-pay | Admitting: *Deleted

## 2009-06-26 ENCOUNTER — Telehealth (INDEPENDENT_AMBULATORY_CARE_PROVIDER_SITE_OTHER): Payer: Self-pay | Admitting: *Deleted

## 2009-07-21 ENCOUNTER — Telehealth (INDEPENDENT_AMBULATORY_CARE_PROVIDER_SITE_OTHER): Payer: Self-pay | Admitting: *Deleted

## 2009-08-06 ENCOUNTER — Inpatient Hospital Stay (HOSPITAL_COMMUNITY): Admission: RE | Admit: 2009-08-06 | Discharge: 2009-08-14 | Payer: Self-pay | Admitting: Infectious Diseases

## 2009-08-06 ENCOUNTER — Encounter: Payer: Self-pay | Admitting: Emergency Medicine

## 2009-08-06 ENCOUNTER — Encounter: Payer: Self-pay | Admitting: Internal Medicine

## 2009-08-14 ENCOUNTER — Encounter: Payer: Self-pay | Admitting: Internal Medicine

## 2009-08-15 ENCOUNTER — Telehealth (INDEPENDENT_AMBULATORY_CARE_PROVIDER_SITE_OTHER): Payer: Self-pay | Admitting: *Deleted

## 2009-08-21 ENCOUNTER — Ambulatory Visit: Payer: Self-pay | Admitting: Infectious Diseases

## 2009-08-21 DIAGNOSIS — B370 Candidal stomatitis: Secondary | ICD-10-CM

## 2009-08-24 ENCOUNTER — Telehealth (INDEPENDENT_AMBULATORY_CARE_PROVIDER_SITE_OTHER): Payer: Self-pay | Admitting: *Deleted

## 2009-09-11 ENCOUNTER — Telehealth (INDEPENDENT_AMBULATORY_CARE_PROVIDER_SITE_OTHER): Payer: Self-pay | Admitting: *Deleted

## 2009-09-27 ENCOUNTER — Ambulatory Visit: Payer: Self-pay | Admitting: Infectious Diseases

## 2009-09-27 LAB — CONVERTED CEMR LAB
ALT: 20 units/L (ref 0–53)
AST: 20 units/L (ref 0–37)
Albumin: 4.2 g/dL (ref 3.5–5.2)
Alkaline Phosphatase: 68 units/L (ref 39–117)
BUN: 9 mg/dL (ref 6–23)
Basophils Absolute: 0 10*3/uL (ref 0.0–0.1)
Basophils Relative: 0 % (ref 0–1)
CO2: 22 meq/L (ref 19–32)
Calcium: 9 mg/dL (ref 8.4–10.5)
Chloride: 107 meq/L (ref 96–112)
Creatinine, Ser: 0.8 mg/dL (ref 0.40–1.50)
Eosinophils Absolute: 0.1 10*3/uL (ref 0.0–0.7)
Eosinophils Relative: 1 % (ref 0–5)
Glucose, Bld: 90 mg/dL (ref 70–99)
HCT: 35.4 % — ABNORMAL LOW (ref 39.0–52.0)
Hemoglobin: 11.8 g/dL — ABNORMAL LOW (ref 13.0–17.0)
Lymphocytes Relative: 44 % (ref 12–46)
Lymphs Abs: 1.8 10*3/uL (ref 0.7–4.0)
MCHC: 33.3 g/dL (ref 30.0–36.0)
MCV: 102.3 fL — ABNORMAL HIGH (ref >=78.0)
Monocytes Absolute: 0.7 10*3/uL (ref 0.1–1.0)
Monocytes Relative: 18 % — ABNORMAL HIGH (ref 3–12)
Neutro Abs: 1.5 10*3/uL — ABNORMAL LOW (ref 1.7–7.7)
Neutrophils Relative %: 37 % — ABNORMAL LOW (ref 43–77)
Platelets: 235 10*3/uL (ref 150–400)
Potassium: 3.5 meq/L (ref 3.5–5.3)
RBC: 3.46 M/uL — ABNORMAL LOW (ref 4.22–5.81)
RDW: 16.4 % — ABNORMAL HIGH (ref 11.5–15.5)
Sodium: 140 meq/L (ref 135–145)
Total Bilirubin: 0.3 mg/dL (ref 0.3–1.2)
Total Protein: 7.5 g/dL (ref 6.0–8.3)
WBC: 4.1 10*3/uL (ref 4.0–10.5)

## 2009-10-09 LAB — CONVERTED CEMR LAB
HIV 1 RNA Quant: 110000 copies/mL — ABNORMAL HIGH (ref <48)
HIV-1 RNA Quant, Log: 5.04 — ABNORMAL HIGH (ref <1.68)

## 2009-10-16 ENCOUNTER — Telehealth (INDEPENDENT_AMBULATORY_CARE_PROVIDER_SITE_OTHER): Payer: Self-pay | Admitting: *Deleted

## 2009-11-08 ENCOUNTER — Telehealth (INDEPENDENT_AMBULATORY_CARE_PROVIDER_SITE_OTHER): Payer: Self-pay | Admitting: *Deleted

## 2009-12-08 ENCOUNTER — Telehealth (INDEPENDENT_AMBULATORY_CARE_PROVIDER_SITE_OTHER): Payer: Self-pay | Admitting: *Deleted

## 2010-01-04 ENCOUNTER — Telehealth (INDEPENDENT_AMBULATORY_CARE_PROVIDER_SITE_OTHER): Payer: Self-pay | Admitting: *Deleted

## 2010-01-22 ENCOUNTER — Encounter (INDEPENDENT_AMBULATORY_CARE_PROVIDER_SITE_OTHER): Payer: Self-pay | Admitting: *Deleted

## 2010-01-25 ENCOUNTER — Ambulatory Visit: Payer: Self-pay | Admitting: Infectious Diseases

## 2010-01-25 LAB — CONVERTED CEMR LAB
Alkaline Phosphatase: 75 units/L (ref 39–117)
Basophils Absolute: 0 10*3/uL (ref 0.0–0.1)
Basophils Relative: 0 % (ref 0–1)
Glucose, Bld: 84 mg/dL (ref 70–99)
HIV-1 RNA Quant, Log: 3.09 — ABNORMAL HIGH (ref <1.68)
LDL Cholesterol: 69 mg/dL (ref 0–99)
MCHC: 33.9 g/dL (ref 30.0–36.0)
Neutro Abs: 1.9 10*3/uL (ref 1.7–7.7)
Neutrophils Relative %: 50 % (ref 43–77)
RDW: 13.2 % (ref 11.5–15.5)
Sodium: 141 meq/L (ref 135–145)
Total Bilirubin: 0.7 mg/dL (ref 0.3–1.2)
Total CHOL/HDL Ratio: 3
Total Protein: 7.4 g/dL (ref 6.0–8.3)
Triglycerides: 80 mg/dL (ref <150)
VLDL: 16 mg/dL (ref 0–40)

## 2010-01-29 ENCOUNTER — Telehealth (INDEPENDENT_AMBULATORY_CARE_PROVIDER_SITE_OTHER): Payer: Self-pay | Admitting: *Deleted

## 2010-02-07 ENCOUNTER — Encounter (INDEPENDENT_AMBULATORY_CARE_PROVIDER_SITE_OTHER): Payer: Self-pay | Admitting: *Deleted

## 2010-02-07 ENCOUNTER — Ambulatory Visit: Payer: Self-pay | Admitting: Infectious Diseases

## 2010-02-20 ENCOUNTER — Telehealth (INDEPENDENT_AMBULATORY_CARE_PROVIDER_SITE_OTHER): Payer: Self-pay | Admitting: *Deleted

## 2010-03-26 ENCOUNTER — Telehealth (INDEPENDENT_AMBULATORY_CARE_PROVIDER_SITE_OTHER): Payer: Self-pay | Admitting: *Deleted

## 2010-04-12 ENCOUNTER — Emergency Department (HOSPITAL_COMMUNITY): Admission: EM | Admit: 2010-04-12 | Discharge: 2010-04-12 | Payer: Self-pay | Admitting: Emergency Medicine

## 2010-04-23 ENCOUNTER — Ambulatory Visit: Payer: Self-pay | Admitting: Infectious Diseases

## 2010-04-23 LAB — CONVERTED CEMR LAB
AST: 30 units/L (ref 0–37)
Alkaline Phosphatase: 73 units/L (ref 39–117)
BUN: 12 mg/dL (ref 6–23)
Basophils Absolute: 0 10*3/uL (ref 0.0–0.1)
Basophils Relative: 0 % (ref 0–1)
Creatinine, Ser: 0.82 mg/dL (ref 0.40–1.50)
Eosinophils Absolute: 0.2 10*3/uL (ref 0.0–0.7)
Eosinophils Relative: 5 % (ref 0–5)
Glucose, Bld: 102 mg/dL — ABNORMAL HIGH (ref 70–99)
HCT: 37.8 % — ABNORMAL LOW (ref 39.0–52.0)
HIV 1 RNA Quant: 9490 copies/mL — ABNORMAL HIGH (ref <48)
HIV-1 RNA Quant, Log: 3.98 — ABNORMAL HIGH (ref <1.68)
Lymphocytes Relative: 51 % — ABNORMAL HIGH (ref 12–46)
MCHC: 35.2 g/dL (ref 30.0–36.0)
MCV: 94.7 fL (ref 78.0–100.0)
Platelets: 203 10*3/uL (ref 150–400)
RDW: 13.2 % (ref 11.5–15.5)
Total Bilirubin: 1 mg/dL (ref 0.3–1.2)

## 2010-05-07 ENCOUNTER — Ambulatory Visit: Payer: Self-pay | Admitting: Infectious Diseases

## 2010-05-07 DIAGNOSIS — J309 Allergic rhinitis, unspecified: Secondary | ICD-10-CM | POA: Insufficient documentation

## 2010-05-14 ENCOUNTER — Emergency Department (HOSPITAL_COMMUNITY): Admission: EM | Admit: 2010-05-14 | Discharge: 2010-05-14 | Payer: Self-pay | Admitting: Emergency Medicine

## 2010-05-16 ENCOUNTER — Telehealth (INDEPENDENT_AMBULATORY_CARE_PROVIDER_SITE_OTHER): Payer: Self-pay | Admitting: *Deleted

## 2010-06-15 ENCOUNTER — Telehealth (INDEPENDENT_AMBULATORY_CARE_PROVIDER_SITE_OTHER): Payer: Self-pay | Admitting: *Deleted

## 2010-06-25 ENCOUNTER — Ambulatory Visit: Payer: Self-pay | Admitting: Infectious Diseases

## 2010-06-25 LAB — CONVERTED CEMR LAB: HIV 1 RNA Quant: <48 copies/mL (ref <48)

## 2010-07-03 ENCOUNTER — Telehealth: Payer: Self-pay | Admitting: Infectious Diseases

## 2010-07-11 ENCOUNTER — Ambulatory Visit: Payer: Self-pay | Admitting: Infectious Diseases

## 2010-07-11 DIAGNOSIS — H3090 Unspecified chorioretinal inflammation, unspecified eye: Secondary | ICD-10-CM | POA: Insufficient documentation

## 2010-07-13 ENCOUNTER — Telehealth (INDEPENDENT_AMBULATORY_CARE_PROVIDER_SITE_OTHER): Payer: Self-pay | Admitting: *Deleted

## 2010-08-02 ENCOUNTER — Encounter: Payer: Self-pay | Admitting: Infectious Diseases

## 2010-08-10 ENCOUNTER — Telehealth (INDEPENDENT_AMBULATORY_CARE_PROVIDER_SITE_OTHER): Payer: Self-pay | Admitting: *Deleted

## 2010-08-14 ENCOUNTER — Ambulatory Visit: Payer: Self-pay | Admitting: Infectious Diseases

## 2010-08-14 LAB — CONVERTED CEMR LAB
AST: 20 units/L (ref 0–37)
Albumin: 4.4 g/dL (ref 3.5–5.2)
Alkaline Phosphatase: 62 units/L (ref 39–117)
BUN: 13 mg/dL (ref 6–23)
Basophils Relative: 0 % (ref 0–1)
Calcium: 9.3 mg/dL (ref 8.4–10.5)
Chloride: 107 meq/L (ref 96–112)
Eosinophils Absolute: 0.1 10*3/uL (ref 0.0–0.7)
Glucose, Bld: 92 mg/dL (ref 70–99)
Lymphs Abs: 2.4 10*3/uL (ref 0.7–4.0)
Monocytes Relative: 10 % (ref 3–12)
Neutro Abs: 2.9 10*3/uL (ref 1.7–7.7)
Neutrophils Relative %: 49 % (ref 43–77)
Platelets: 269 10*3/uL (ref 150–400)
Potassium: 3.8 meq/L (ref 3.5–5.3)
RBC: 3.92 M/uL — ABNORMAL LOW (ref 4.22–5.81)
Sodium: 140 meq/L (ref 135–145)
Total Protein: 7.3 g/dL (ref 6.0–8.3)
WBC: 6 10*3/uL (ref 4.0–10.5)

## 2010-09-10 ENCOUNTER — Telehealth (INDEPENDENT_AMBULATORY_CARE_PROVIDER_SITE_OTHER): Payer: Self-pay | Admitting: *Deleted

## 2010-09-26 ENCOUNTER — Ambulatory Visit: Payer: Self-pay | Admitting: Infectious Diseases

## 2010-10-10 ENCOUNTER — Encounter (INDEPENDENT_AMBULATORY_CARE_PROVIDER_SITE_OTHER): Payer: Self-pay | Admitting: *Deleted

## 2010-12-18 NOTE — Progress Notes (Signed)
Summary: ncadap meds arrived for Sept-pt aware  Phone Note Refill Request      Prescriptions: BACTRIM DS 800-160 MG TABS (SULFAMETHOXAZOLE-TRIMETHOPRIM) one tabl Mon, Wed, Fri  #12 x 0   Entered by:   Paulo Fruit  BS,CPht II,MPH   Authorized by:   Johny Sax MD   Signed by:   Paulo Fruit  BS,CPht II,MPH on 08/10/2010   Method used:   Samples Given   RxID:   [id number] VALACYCLOVIR HCL 1 GM TABS (VALACYCLOVIR HCL) Take 1 tablet by mouth two times a day  #28 x 0   Entered by:   Paulo Fruit  BS,CPht II,MPH   Authorized by:   Johny Sax MD   Signed by:   Paulo Fruit  BS,CPht II,MPH on 08/10/2010   Method used:   Samples Given   RxID:   [id number] TRUVADA 200-300 MG TABS (EMTRICITABINE-TENOFOVIR) Take 1 tablet by mouth once a day  #30 x 0   Entered by:   Paulo Fruit  BS,CPht II,MPH   Authorized by:   Johny Sax MD   Signed by:   Paulo Fruit  BS,CPht II,MPH on 08/10/2010   Method used:   Samples Given   RxID:   [id number] ISENTRESS 400 MG TABS (RALTEGRAVIR POTASSIUM) Take 1 tablet by mouth two times a day  #60 x 0   Entered by:   Paulo Fruit  BS,CPht II,MPH   Authorized by:   Johny Sax MD   Signed by:   Paulo Fruit  BS,CPht II,MPH on 08/10/2010   Method used:   Samples Given   RxID:   [id number] NORVIR 100 MG TABS (RITONAVIR) Take 1 tablet by mouth once a day  #30 x 0   Entered by:   Paulo Fruit  BS,CPht II,MPH   Authorized by:   Johny Sax MD   Signed by:   Paulo Fruit  BS,CPht II,MPH on 08/10/2010   Method used:   Samples Given   RxID:   [id number] REYATAZ 300 MG CAPS (ATAZANAVIR SULFATE) Take 1 tablet by mouth once a day  #30 x 0   Entered by:   Paulo Fruit  BS,CPht II,MPH   Authorized by:   Johny Sax MD   Signed by:   Paulo Fruit  BS,CPht II,MPH on 08/10/2010   Method used:   Samples Given   RxID:   [id number]  Patient Assist Medication Verification: Medication name: [name] 300mg  RX #  [id number] Tech approval:mld  Patient Assist Medication Verification: Medication name:sulfameth/trimetroprim 800/160mg  RX # [id number] Tech approval:mld  Patient Assist Medication Verification: Medication name:isentress 400mg  RX # [id number] Tech approval:mld  Patient Assist Medication Verification: Medication name:truvada RX # [id number] Tech approval:mld  Patient Assist Medication Verification: Medication name:valacyclovir 1gram RX # [id number] Tech approval:mld  Patient Assist Medication Verification: Medication name:novir 100mg  RX # [id number] Tech approval:mld Call placed to patient with message that assistance medications are ready for pick-up. Paulo Fruit  BS,CPht II,MPH  August 10, 2010 4:44 PM   Appended Document: ncadap meds arrived for Sept-pt aware Prescription/Samples picked up by: patient

## 2010-12-18 NOTE — Progress Notes (Signed)
Summary: [place]/pt assists meds arrived for Jul  Phone Note Refill Request      Prescriptions: TRUVADA 200-300 MG TABS (EMTRICITABINE-TENOFOVIR) Take 1 tablet by mouth once a day  #30 x 0   Entered by:   Paulo Fruit  BS,CPht II,MPH   Authorized by:   Johny Sax MD   Signed by:   Paulo Fruit  BS,CPht II,MPH on 06/15/2010   Method used:   Samples Given   RxID:   [id number] ISENTRESS 400 MG TABS (RALTEGRAVIR POTASSIUM) Take 1 tablet by mouth two times a day  #60 x 0   Entered by:   Paulo Fruit  BS,CPht II,MPH   Authorized by:   Johny Sax MD   Signed by:   Paulo Fruit  BS,CPht II,MPH on 06/15/2010   Method used:   Samples Given   RxID:   [id number] NORVIR 100 MG TABS (RITONAVIR) Take 1 tablet by mouth once a day  #30 x 0   Entered by:   Paulo Fruit  BS,CPht II,MPH   Authorized by:   Johny Sax MD   Signed by:   Paulo Fruit  BS,CPht II,MPH on 06/15/2010   Method used:   Samples Given   RxID:   [id number] REYATAZ 300 MG CAPS (ATAZANAVIR SULFATE) Take 1 tablet by mouth once a day  #30 x 0   Entered by:   Paulo Fruit  BS,CPht II,MPH   Authorized by:   Johny Sax MD   Signed by:   Paulo Fruit  BS,CPht II,MPH on 06/15/2010   Method used:   Samples Given   RxID:   [id number]  Patient Assist Medication Verification: Medication name: Truvada RX # [id number] Tech approval:MLD  Patient Assist Medication Verification: Medication name:Reyataz 300mg  RX # [id number] Tech approval:MLD  Patient Assist Medication Verification: Medication name:Norvir 100mg  RX # [id number] Tech approval:MLD  Patient Assist Medication Verification: Medication name:Isentress 400mg  RX # [id number] Tech approval:MLD Call placed to patient with message that assistance medications are ready for pick-up. Paulo Fruit  BS,CPht II,MPH  June 15, 2010 4:10 PM

## 2010-12-18 NOTE — Miscellaneous (Signed)
  Clinical Lists Changes  Observations: Added new observation of YEARAIDSPOS: 2007  (10/10/2010 9:15)

## 2010-12-18 NOTE — Progress Notes (Signed)
Summary: pt. notified ADAP meds. arrived  Phone Note Outgoing Call   Call placed by: Annice Pih Summary of Call: Left message for pt. that his ADAP meds have arrived for October, Norvir, Bactrim, Truvada, Reyataz, Isentress, and Valacyclovir. Initial call taken by: Wendall Mola CMA Duncan Dull),  September 10, 2010 9:33 AM     Appended Document: pt. notified ADAP meds. arrived Pt. picked up ADAP meds

## 2010-12-18 NOTE — Miscellaneous (Signed)
Summary: Orders Update - labs  Clinical Lists Changes  Orders: Added new Test order of T-CBC w/Diff [phone]) - Signed Added new Test order of T-CD4SP [facility]) (CD4SP) - Signed Added new Test order of T-Comprehensive Metabolic Panel [id number]) - Signed Added new Test order of T-HIV Viral Load [phone]) - Signed Added new Test order of T-RPR (Syphilis) [phone]) - Signed Added new Test order of T-Lipid Profile ([id number]) - Signed     Process Orders Check Orders Results:     [facility]: ABN not required for this insurance Order queued for requisitioning for [facility]: January 25, 2010 11:51 AM  Tests Sent for requisitioning (January 25, 2010 11:51 AM):     01/25/2010: [facility] -- T-CBC w/Diff [[id number]] (signed)     01/25/2010: [facility] -- T-Comprehensive Metabolic Panel [[id number]] (signed)     01/25/2010: [facility] -- T-HIV Viral Load [phone] (signed)     01/25/2010: [facility] -- T-RPR (Syphilis) [phone] (signed)     01/25/2010: [facility] -- T-Lipid Profile [phone] (signed)

## 2010-12-18 NOTE — Medication Information (Signed)
Summary: [facility]: Rx  [facility]: Rx   Imported By: Florinda Marker 08/06/2010 14:38:53  _____________________________________________________________________  External Attachment:    Type:   Image     Comment:   External Document

## 2010-12-18 NOTE — Progress Notes (Signed)
Summary: [place]/pt assist meds arrived for Feb  Phone Note Refill Request      Prescriptions: TRUVADA 200-300 MG TABS (EMTRICITABINE-TENOFOVIR) Take 1 tablet by mouth once a day  #30 x 0   Entered by:   Paulo Fruit  BS,CPht II,MPH   Authorized by:   Johny Sax MD   Signed by:   Paulo Fruit  BS,CPht II,MPH on 01/04/2010   Method used:   Samples Given   RxID:   [id number] ISENTRESS 400 MG TABS (RALTEGRAVIR POTASSIUM) Take 1 tablet by mouth two times a day  #60 x 0   Entered by:   Paulo Fruit  BS,CPht II,MPH   Authorized by:   Johny Sax MD   Signed by:   Paulo Fruit  BS,CPht II,MPH on 01/04/2010   Method used:   Samples Given   RxID:   [id number] NORVIR 100 MG TABS (RITONAVIR) Take 1 tablet by mouth once a day  #30 x 0   Entered by:   Paulo Fruit  BS,CPht II,MPH   Authorized by:   Johny Sax MD   Signed by:   Paulo Fruit  BS,CPht II,MPH on 01/04/2010   Method used:   Samples Given   RxID:   [id number] REYATAZ 300 MG CAPS (ATAZANAVIR SULFATE) Take 1 tablet by mouth once a day  #30 x 0   Entered by:   Paulo Fruit  BS,CPht II,MPH   Authorized by:   Johny Sax MD   Signed by:   Paulo Fruit  BS,CPht II,MPH on 01/04/2010   Method used:   Samples Given   RxID:   [id number] AZITHROMYCIN 600 MG TABS (AZITHROMYCIN) Take 2 tabs my mouth once weekly  #8 x 0   Entered by:   Paulo Fruit  BS,CPht II,MPH   Authorized by:   Johny Sax MD   Signed by:   Paulo Fruit  BS,CPht II,MPH on 01/04/2010   Method used:   Samples Given   RxID:   [id number] BACTRIM DS 800-160 MG TABS (SULFAMETHOXAZOLE-TRIMETHOPRIM) take one tab by mouth once every monday, wed, and friday  #12 x 0   Entered by:   Paulo Fruit  BS,CPht II,MPH   Authorized by:   Johny Sax MD   Signed by:   Paulo Fruit  BS,CPht II,MPH on 01/04/2010   Method used:   Samples Given   RxID:   [id number]  Patient Assist Medication Verification: Medication name:  SMZ-TMP DS 800/160mg  RX # [id number] Tech approval:MLD  Patient Assist Medication Verification: Medication name:Azithromycin 600mg  RX # [id number] Tech approval:MLD   Patient Assist Medication Verification: Medication:Reyataz 300mg  TGG#[id number] Exp Date:Nov 2012 Tech approval:MLD                Patient Assist Medication Verification: Medication: Norvir 100mg  OEV#[id number] Exp Date:21 Mar 2011 Tech approval:MLD                Patient Assist Medication Verification: Medication:Isentress 400mg  FGH#[id number] Exp Date:02 2013 Tech approval:MLD                Patient Assist Medication Verification: Medication: Truvada Lot# [id number] Exp Date:07 2014 Tech approval:MLD Call placed to patient with message that assistance medications are ready for pick-up. Paulo Fruit  BS,CPht II,MPH  January 04, 2010 2:23 PM

## 2010-12-18 NOTE — Progress Notes (Signed)
Summary: [place] meds arrived for Mar  Phone Note Refill Request      Prescriptions: TRUVADA 200-300 MG TABS (EMTRICITABINE-TENOFOVIR) Take 1 tablet by mouth once a day  #30 x 0   Entered by:   Paulo Fruit  BS,CPht II,MPH   Authorized by:   Johny Sax MD   Signed by:   Paulo Fruit  BS,CPht II,MPH on 01/29/2010   Method used:   Samples Given   RxID:   [id number] ISENTRESS 400 MG TABS (RALTEGRAVIR POTASSIUM) Take 1 tablet by mouth two times a day  #60 x 0   Entered by:   Paulo Fruit  BS,CPht II,MPH   Authorized by:   Johny Sax MD   Signed by:   Paulo Fruit  BS,CPht II,MPH on 01/29/2010   Method used:   Samples Given   RxID:   [id number] NORVIR 100 MG TABS (RITONAVIR) Take 1 tablet by mouth once a day  #30 x 0   Entered by:   Paulo Fruit  BS,CPht II,MPH   Authorized by:   Johny Sax MD   Signed by:   Paulo Fruit  BS,CPht II,MPH on 01/29/2010   Method used:   Samples Given   RxID:   [id number] REYATAZ 300 MG CAPS (ATAZANAVIR SULFATE) Take 1 tablet by mouth once a day  #30 x 0   Entered by:   Paulo Fruit  BS,CPht II,MPH   Authorized by:   Johny Sax MD   Signed by:   Paulo Fruit  BS,CPht II,MPH on 01/29/2010   Method used:   Samples Given   RxID:   [id number] AZITHROMYCIN 600 MG TABS (AZITHROMYCIN) Take 2 tabs my mouth once weekly  #8 x 0   Entered by:   Paulo Fruit  BS,CPht II,MPH   Authorized by:   Johny Sax MD   Signed by:   Paulo Fruit  BS,CPht II,MPH on 01/29/2010   Method used:   Samples Given   RxID:   [id number] BACTRIM DS 800-160 MG TABS (SULFAMETHOXAZOLE-TRIMETHOPRIM) take one tab by mouth once every monday, wed, and friday  #12 x 0   Entered by:   Paulo Fruit  BS,CPht II,MPH   Authorized by:   Johny Sax MD   Signed by:   Paulo Fruit  BS,CPht II,MPH on 01/29/2010   Method used:   Samples Given   RxID:   [id number]  Patient Assist Medication Verification: Medication name:  Azithromycin 600mg  RX # [id number] Tech approval:MLD   Patient Assist Medication Verification: Medication:Reyataz 300mg  KYH#[id number] Exp Date:Dec 2012 Tech approval:MLD                Patient Assist Medication Verification: Medication:NOrvir 100mg  GBT#[id number] Exp Date:23 Mar 2011 Tech approval:MLD                Patient Assist Medication Verification: Medication:Isentress 400mg  WVP#[id number] Exp Date:03 2013 Tech approval:MLD                Patient Assist Medication Verification: Medication: Truvada Lot# [id number] Exp Date:04 2014 Tech approval:MLD               Patient Assist Medication Verification: Medication name: SMZ-TMP DS 800/160mg  RX # [id number] Tech approval:MLD Tried to contact patient. At the moment of call, was unable to leave message because something wrong with patient's phone line. Paulo Fruit  BS,CPht II,MPH  January 29, 2010 3:25 PM

## 2010-12-18 NOTE — Progress Notes (Signed)
Summary: Herpes outbreak right eye  Phone Note Call from Patient   Reason for Call: Acute Illness Summary of Call: Pt. seen at [facility] eye clinic by Dr. Sondra Barges and prescribed Valtrex 1 gm. for herpes outbreak right eye.  Can not afford med. and would like an RX from Dr. Ninetta Lights to be covered on ADAP.  Was given samples from [facility].  Appt. scheduled for 07/11/10.  Initial call taken by: Wendall Mola CMA ( AAMA),  July 03, 2010 10:14 AM

## 2010-12-18 NOTE — Assessment & Plan Note (Signed)
Summary: poor vision right eye/jc   Primary Tallie Dodds:  n/a  CC:  poor vision in right eye.  History of Present Illness: 45 yo M HIV+. Prev hospitalized 2-4 to 12-25-08 with lower GI bleeding. was found to have hemmerhoids and an ulcerative anal lesion (Path not done, AFB smear negative, Fungal smear negative).  Has hx of anal lesion in 2007 path- squamous atypia. He had a GI eval (rectal bx condyloma, no malignancy 3-10). Was unable to make surgical f/u as he does not have co-pay.   Was in hospital 9-19- to 08-14-09 with suspected PCP (DFA negative).  Rectal sores resolved. used cream, no surgery.   Was seen 06-25-10 with peri-rectal cellulitis and very large rectal condyloma. Was referred to surgery. CD4 170 and VL <48 (06-25-2010).  Today returns with concerns about his R eye. 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  He was started on Valcyte, prednisone eye drops and orally, told he needs to restart his Bactrim. His vision has improved with the valcyte. His rectal lesion has stopped draining.    Preventive Screening-Counseling & Management  Alcohol-Tobacco     Alcohol drinks/day: 0     Smoking Status: never  Caffeine-Diet-Exercise     Caffeine use/day: tea occassionally     Does Patient Exercise: yes     Type of exercise: walking     Exercise (avg: min/session): >60     Times/week: 4  Safety-Violence-Falls     Seat Belt Use: yes   Updated Prior Medication List: LISINOPRIL 20 MG TABS (LISINOPRIL) Take 1 tablet by mouth once a day REYATAZ 300 MG CAPS (ATAZANAVIR SULFATE) Take 1 tablet by mouth once a day NORVIR 100 MG TABS (RITONAVIR) Take 1 tablet by mouth once a day ISENTRESS 400 MG TABS (RALTEGRAVIR POTASSIUM) Take 1 tablet by mouth two times a day TRUVADA 200-300 MG TABS (EMTRICITABINE-TENOFOVIR) Take 1 tablet by mouth once a day DOXYCYCLINE HYCLATE 100 MG CAPS (DOXYCYCLINE HYCLATE) Take 1 tablet by mouth two times a day VALACYCLOVIR HCL 1 GM TABS (VALACYCLOVIR  HCL) Take 1 tablet by mouth two times a day PREDNISONE 5 MG TABS (PREDNISONE) one and 1/2 tab once daily BACTRIM DS 800-160 MG TABS (SULFAMETHOXAZOLE-TRIMETHOPRIM) one tabl Mon, Wed, Fri  Current Allergies (reviewed today): ! 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 no      Nutritional Status BMI of > 30 = obese Nutritional Status Detail appetite is okay per patient  Have you ever been in a relationship where you felt threatened, hurt or afraid?No   Does patient need assistance? Functional Status Self care Ambulation Normal   Physical Exam  General:  well-developed, well-nourished, and well-hydrated.   Eyes:  R pupil blown, some shadowing of retina on this  side and on L.  Mouth:  pharynx pink and moist and no exudates.   Neck:  no masses.   Lungs:  normal respiratory effort and normal breath sounds.   Heart:  normal rate, regular rhythm, and no murmur.   Abdomen:  soft, non-tender, and normal bowel sounds.      Impression & Recommendations:  Problem # 1:  HIV DISEASE (ICD-042) will offer flu shot today. restart his bactrim. offer flu shot.  return to clinic 1-2 months His updated medication list for this problem includes:    Doxycycline Hyclate 100 Mg Caps (Doxycycline hyclate) .[name]... Take 1 tablet by mouth two times a day    Valacyclovir Hcl 1 Gm Tabs  (Valacyclovir hcl) .[name]... Take 1 tablet by mouth two times a day    Bactrim Ds 800-160 Mg Tabs (Sulfamethoxazole-trimethoprim) ..... 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 he will need to f/u with Dr Sondra Barges re: this, closely. Per Pt has f/u on August 20, 2010.   Medications Added to Medication List This Visit: 1)  Valacyclovir Hcl 1 Gm Tabs (Valacyclovir hcl) .... Take 1 tablet by mouth two times a day 2)  Prednisone 5 Mg Tabs (Prednisone) .... One and 1/2 tab once daily 3)  Bactrim Ds 800-160 Mg Tabs (Sulfamethoxazole-trimethoprim) .... One tabl mon, wed, fri  Current Medications (verified): 1)  Lisinopril 20 Mg Tabs (Lisinopril) .... Take 1 Tablet By Mouth Once A Day 2)  Reyataz 300 Mg Caps (Atazanavir Sulfate) .... Take 1 Tablet By Mouth Once A Day 3)  Norvir 100 Mg Tabs (Ritonavir) .... Take 1 Tablet By Mouth Once A Day 4)  Isentress 400 Mg Tabs (Raltegravir Potassium) .... Take 1 Tablet By Mouth Two Times A Day 5)  Truvada 200-300 Mg Tabs (Emtricitabine-Tenofovir) .... Take 1 Tablet By Mouth Once A Day 6)  Doxycycline Hyclate 100 Mg Caps (Doxycycline Hyclate) .... Take 1 Tablet By Mouth Two Times A Day 7)  Valacyclovir Hcl 1 Gm Tabs (Valacyclovir Hcl) .... Take 1 Tablet By Mouth Two Times A Day 8)  Prednisone 5 Mg Tabs (Prednisone) .... One and 1/2 Tab Once Daily 9)  Bactrim Ds 800-160 Mg Tabs (Sulfamethoxazole-Trimethoprim) .... One Tabl Mon, Wed, Fri  Allergies: 1)  ! * Apple Peelings  Prescriptions: BACTRIM DS 800-160 MG TABS (SULFAMETHOXAZOLE-TRIMETHOPRIM) one tabl Mon, Wed, Fri  #30 x 3   Entered and Authorized by:   Johny Sax MD   Signed by:   Johny Sax MD on 07/11/2010   Method used:   Print then Give to Patient   RxID:   [id number] VALACYCLOVIR HCL 1 GM TABS (VALACYCLOVIR HCL) Take 1 tablet by mouth two times a day  #28 x 1   Entered and Authorized by:   Johny Sax MD   Signed by:    Johny Sax MD on 07/11/2010   Method used:   Historical   RxID:   [id number]   Appended Document: Orders Update    Clinical Lists Changes  Orders: Added new Service order of Est. Patient Level III (34742) - Signed

## 2010-12-18 NOTE — Assessment & Plan Note (Signed)
Summary: 4 MONTH CHECKUP/CH   Primary Provider:  n/a  CC:  follow-up visit and lab results.  History of Present Illness: 45 yo M with HIV here for f/u. Was hospitalized 2-4 to 12-25-08 with lower GI bleeding. was found to have hemmerhoids and an ulcerative anal lesion (Path not done, AFB smear negative, Fungal smear negative).  Has hx of anal lesion in 2007 path- squamous atypia.  He had a GI eval (rectal bx condyloma, no malignancy 3-10). Was unable to make surgical f/u as he does not have co-pay.  Was in hospital 9-19- to 08-14-09 with PCP (DFA negative).  Rectal sores resolved. used cream, no surgery.  CD4 130 and VL 1240 (01-25-10).  feels well, medicine been going well. had a knot in his mouth which made his jaw swell up. no probs with rectal lesions.   Preventive Screening-Counseling & Management  Alcohol-Tobacco     Alcohol drinks/day: 0     Smoking Status: never  Caffeine-Diet-Exercise     Caffeine use/day: tea occassionally     Does Patient Exercise: yes     Type of exercise: walking     Exercise (avg: min/session): >60     Times/week: 4  Safety-Violence-Falls     Seat Belt Use: yes   Updated Prior Medication List: LISINOPRIL 20 MG TABS (LISINOPRIL) Take 1 tablet by mouth once a day BACTRIM DS 800-160 MG TABS (SULFAMETHOXAZOLE-TRIMETHOPRIM) take one tab by mouth once every monday, wed, and friday REYATAZ 300 MG CAPS (ATAZANAVIR SULFATE) Take 1 tablet by mouth once a day NORVIR 100 MG TABS (RITONAVIR) Take 1 tablet by mouth once a day ISENTRESS 400 MG TABS (RALTEGRAVIR POTASSIUM) Take 1 tablet by mouth two times a day TRUVADA 200-300 MG TABS (EMTRICITABINE-TENOFOVIR) Take 1 tablet by mouth once a day  Current Allergies (reviewed today): No known allergies  Past History:  Past Medical History: Hepatitis C HIV disease 02/06/06    genotype 09-2009:  K103N,M184V,G190A,L210W Rectal ulcer Nov.2006 GI bleed (gastric ulcer) 2-10 Rectal Warts PCP 07-2009  Review of  Systems       wt up 25#, exercising, apetite is "crazy",   Vital Signs:  Patient profile:   45 year old male Height:      72 inches (182.88 cm) Weight:      231.2 pounds (105.09 kg) BMI:     31.47 Temp:     99.3 degrees F (37.39 degrees C) oral Pulse rate:   86 / minute BP sitting:   123 / 75  (left arm)  Vitals Entered By: Wendall Mola CMA Duncan Dull) (February 07, 2010 10:54 AM) CC: follow-up visit, lab results Is Patient Diabetic? No Pain Assessment Patient in pain? no      Nutritional Status BMI of > 30 = obese Nutritional Status Detail appetite "fine"  Does patient need assistance? Functional Status Self care Ambulation Normal Comments no missed doses of meds per patient   Physical Exam  General:  well-developed, well-nourished, and well-hydrated.   Eyes:  pupils equal, pupils round, and pupils reactive to light.   Mouth:  pharynx pink and moist and no exudates.   Neck:  has firm area over his L mandible. no fluctuance.  Lungs:  normal respiratory effort and normal breath sounds.   Heart:  normal rate, regular rhythm, and no murmur.   Abdomen:  soft, non-tender, and normal bowel sounds.          Medication Adherence: 02/07/2010   Adherence to medications reviewed with patient. Counseling to provide  adequate adherence provided   Prevention For Positives: 02/07/2010   Safe sex practices discussed with patient. Condoms offered.                             Impression & Recommendations:  Problem # 1:  HIV DISEASE (ICD-042) appears to be doing well, given condoms. will watch his CD4 and VL on his new rx. will see him back in 3-4 months for recheck labs. his lesion on his mandible is ?. have asked him to call back in 1 week if not improved.  The following medications were removed from the medication list:    Azithromycin 600 Mg Tabs (Azithromycin) .[name]... Take 2 tabs my mouth once weekly His updated medication list for this problem includes:    Bactrim Ds  800-160 Mg Tabs (Sulfamethoxazole-trimethoprim) .[name]... Take one tab by mouth once every monday, wed, and friday  Problem # 2:  ULCER, RECTUM (ICD-569.41) will recheck his rectum at f/u.   Problem # 3:  HYPERTENSION (ICD-401.9) well controlled today.  His updated medication list for this problem includes:    Lisinopril 20 Mg Tabs (Lisinopril) .[name]... Take 1 tablet by mouth once a day  Other Orders: Est. Patient Level IV (16109) Future Orders: T-CD4SP ([facility]) (CD4SP) ... 05/08/2010 T-HIV Viral Load [phone]) ... 05/08/2010 T-Comprehensive Metabolic Panel [phone]) ... 05/08/2010 T-HIV Genotype ([id number]) ... 05/08/2010 T-CBC w/Diff ([id number]) ... 05/08/2010  Process Orders Check Orders Results:     [facility]: ABN not required for this insurance Tests Sent for requisitioning (February 07, 2010 11:43 AM):     05/08/2010: [facility] -- T-HIV Viral Load [phone] (signed)     05/08/2010: [facility] -- T-Comprehensive Metabolic Panel [[id number]] (signed)     05/08/2010: [facility] -- T-HIV Genotype [phone] (signed)     05/08/2010: [facility] -- T-CBC w/Diff [[id number]] (signed)   Prevention & Chronic Care Immunizations   Influenza vaccine: Fluvax 3+  (09/27/2009)    Tetanus booster: Not documented    Pneumococcal vaccine: Historical  (03/04/2006)  Other Screening   Smoking status: never  (02/07/2010)  Lipids   Total Cholesterol: 127  (01/25/2010)   LDL: 69  (01/25/2010)   LDL Direct: Not documented   HDL: 42  (01/25/2010)   Triglycerides: 80  (01/25/2010)  Hypertension   Last Blood Pressure: 123 / 75  (02/07/2010)   Serum creatinine: 0.84  (01/25/2010)   Serum potassium 4.1  (01/25/2010) CMP ordered   Self-Management Support :    Hypertension self-management support: Not documented

## 2010-12-18 NOTE — Miscellaneous (Signed)
Summary: clinical update/ryan white [facility] app completed  Clinical Lists Changes  Observations: Added new observation of PCTFPL: 93.30  (01/22/2010 9:43) Added new observation of INCOMESOURCE: WAGES  (01/22/2010 9:43) Added new observation of HOUSEINCOME: 02725  (01/22/2010 9:43) Added new observation of FINASSESSDT: 01/12/2010  (01/22/2010 9:43)

## 2010-12-18 NOTE — Progress Notes (Signed)
Summary: [facility]/pt assist meds arrived for Apr  Phone Note Refill Request      Prescriptions: TRUVADA 200-300 MG TABS (EMTRICITABINE-TENOFOVIR) Take 1 tablet by mouth once a day  #30 x 0   Entered by:   Paulo Fruit  BS,CPht II,MPH   Authorized by:   Johny Sax MD   Signed by:   Paulo Fruit  BS,CPht II,MPH on 02/20/2010   Method used:   Samples Given   RxID:   [id number] ISENTRESS 400 MG TABS (RALTEGRAVIR POTASSIUM) Take 1 tablet by mouth two times a day  #60 x 0   Entered by:   Paulo Fruit  BS,CPht II,MPH   Authorized by:   Johny Sax MD   Signed by:   Paulo Fruit  BS,CPht II,MPH on 02/20/2010   Method used:   Samples Given   RxID:   [id number] NORVIR 100 MG TABS (RITONAVIR) Take 1 tablet by mouth once a day  #30 x 0   Entered by:   Paulo Fruit  BS,CPht II,MPH   Authorized by:   Johny Sax MD   Signed by:   Paulo Fruit  BS,CPht II,MPH on 02/20/2010   Method used:   Samples Given   RxID:   [id number] REYATAZ 300 MG CAPS (ATAZANAVIR SULFATE) Take 1 tablet by mouth once a day  #30 x 0   Entered by:   Paulo Fruit  BS,CPht II,MPH   Authorized by:   Johny Sax MD   Signed by:   Paulo Fruit  BS,CPht II,MPH on 02/20/2010   Method used:   Samples Given   RxID:   [id number] BACTRIM DS 800-160 MG TABS (SULFAMETHOXAZOLE-TRIMETHOPRIM) take one tab by mouth once every monday, wed, and friday  #12 x 0   Entered by:   Paulo Fruit  BS,CPht II,MPH   Authorized by:   Johny Sax MD   Signed by:   Paulo Fruit  BS,CPht II,MPH on 02/20/2010   Method used:   Samples Given   RxID:   [id number]  Patient Assist Medication Verification: Medication name:SMZ-TMP DS 800mg /160mg  RX # [id number] Tech approval:MLD   Patient Assist Medication Verification: Medication:Reyataz 300mg  SAY#[id number] Exp Date:JAN 2013 Tech approval:MLD                Patient Assist Medication Verification: Medication:Isentress 400mg  Lot# [id number] Exp  Date:06/2011 Tech approval:MLD                Patient Assist Medication Verification: Medication:Norvir 100mg  DUK#[id number] Exp Date:28 Aug 2011 Tech approval:MLD                Patient Assist Medication Verification: Medication:Truvada Lot# [id number] Exp Date:08 2014 Tech approval:MLD Tried to contact patient. Was unable to leave a message because no voicemail Paulo Fruit  BS,CPht II,MPH  February 20, 2010 4:25 PM

## 2010-12-18 NOTE — Miscellaneous (Signed)
Summary: clinical update/ryan white [facility] appr til 02/16/11  Clinical Lists Changes  Observations: Added new observation of AIDSDAP: Yes 2011 (02/07/2010 9:30)

## 2010-12-18 NOTE — Assessment & Plan Note (Signed)
Summary: pt.may have boil no show[mkj]   Primary Provider:  n/a  CC:  patient may have a boil.  History of Present Illness: 45 yo M HIV+.  Was hospitalized 2-4 to 12-25-08 with lower GI bleeding. was found to have hemmerhoids and an ulcerative anal lesion (Path not done, AFB smear negative, Fungal smear negative).  Has hx of anal lesion in 2007 path- squamous atypia.  He had a GI eval (rectal bx condyloma, no malignancy 3-10). Was unable to make surgical f/u as he does not have co-pay.  Was in hospital 9-19- to 08-14-09 with PCP (DFA negative).  Rectal sores resolved. used cream, no surgery.  CD4 240 and VL 9490 (04-2010).   "Something is going on with my booty". Has draining lesion. has had since his previous visit. Non-painful.  No problem with medicines.   Preventive Screening-Counseling & Management  Alcohol-Tobacco     Alcohol drinks/day: 0     Smoking Status: never  Caffeine-Diet-Exercise     Caffeine use/day: tea occassionally     Does Patient Exercise: yes     Type of exercise: walking     Exercise (avg: min/session): >60     Times/week: 4  Safety-Violence-Falls     Seat Belt Use: yes   Updated Prior Medication List: LISINOPRIL 20 MG TABS (LISINOPRIL) Take 1 tablet by mouth once a day REYATAZ 300 MG CAPS (ATAZANAVIR SULFATE) Take 1 tablet by mouth once a day NORVIR 100 MG TABS (RITONAVIR) Take 1 tablet by mouth once a day ISENTRESS 400 MG TABS (RALTEGRAVIR POTASSIUM) Take 1 tablet by mouth two times a day TRUVADA 200-300 MG TABS (EMTRICITABINE-TENOFOVIR) Take 1 tablet by mouth once a day  Current Allergies (reviewed today): ! * APPLE PEELINGS Past History:  Past medical, surgical, family and social histories (including risk factors) reviewed, and no changes noted (except as noted below).  Past Medical History: Reviewed history from 05/07/2010 and no changes required. Hepatitis C HIV disease 02/06/06    genotype 09-2009:  K103N,M184V,G190A,L210W    genotype  04-23-10: K103N,M184V,G190A,L210W Rectal ulcer Nov.2006 GI bleed (gastric ulcer) 2-10 Rectal Warts PCP 07-2009  Family History: Reviewed history from 05/30/2008 and no changes required. denies  Social History: Reviewed history from 05/30/2008 and no changes required. Never Smoked Alcohol use-no Drug use-no  Review of Systems       no fevers or chills.   Vital Signs:  Patient profile:   45 year old male Height:      72 inches (182.88 cm) Weight:      237.8 pounds (108.09 kg) BMI:     32.37 Temp:     99.1 degrees F (37.28 degrees C) oral Pulse rate:   90 / minute BP sitting:   129 / 83  (left arm)  Vitals Entered By: Baxter Hire) (June 25, 2010 9:57 AM) CC: patient may have a boil Pain Assessment Patient in pain? no      Nutritional Status BMI of > 30 = obese Nutritional Status Detail appetite is okay per patient  Have you ever been in a relationship where you felt threatened, hurt or afraid?No   Does patient need assistance? Functional Status Self care Ambulation Normal   Physical Exam  General:  well-developed, well-nourished, and well-hydrated.   Eyes:  pupils equal, pupils round, and pupils reactive to light.   Mouth:  pharynx pink and moist, no exudates, and poor dentition.   Neck:  no masses.   Lungs:  normal respiratory effort and normal breath  sounds.   Heart:  normal rate, regular rhythm, and no murmur.   Abdomen:  soft, non-tender, and normal bowel sounds.   Rectal:  he has a small lesion on L lower gluteus. has large condyloma. normal appearing stool. no d/c from his lesions.         Medication Adherence: 06/25/2010   Adherence to medications reviewed with patient. Counseling to provide adequate adherence provided   Prevention For Positives: 06/25/2010   Safe sex practices discussed with patient. Condoms offered.                             Impression & Recommendations:  Problem # 1:  HIV DISEASE (ICD-042)  he appears to be  doing well. offered condoms (not sexually active). will recheck his CD4 and VL. return to clinic 1-2 months.   His updated medication list for this problem includes:    Doxycycline Hyclate 100 Mg Caps (Doxycycline hyclate) .[name]... Take 1 tablet by mouth two times a day  Orders: Surgical Referral (Surgery)  Problem # 2:  VENEREAL WART (ICD-078.11)  will have him seen by GI surgery. I made it clear to him that he needs to make this appt and that this is his best option. Will give him doxy for d/c  and possible cellutlitis.   Orders: Surgical Referral (Surgery)  Problem # 3:  HEPATITIS C (ICD-070.51) will cont to follow his LFTs.   Medications Added to Medication List This Visit: 1)  Doxycycline Hyclate 100 Mg Caps (Doxycycline hyclate) .... Take 1 tablet by mouth two times a day  Other Orders: Est. Patient Level IV (98119) T-CD4SP ([facility]) (CD4SP) T-HIV Viral Load ([id number])  Prescriptions: DOXYCYCLINE HYCLATE 100 MG CAPS (DOXYCYCLINE HYCLATE) Take 1 tablet by mouth two times a day  #20 x 1   Entered and Authorized by:   Johny Sax MD   Signed by:   Johny Sax MD on 06/25/2010   Method used:   Print then Give to Patient   RxID:   [id number]

## 2010-12-18 NOTE — Progress Notes (Signed)
Summary: [facility]/pt assist meds arrived for Jan  Phone Note Refill Request      Prescriptions: TRUVADA 200-300 MG TABS (EMTRICITABINE-TENOFOVIR) Take 1 tablet by mouth once a day  #30 x 0   Entered by:   Paulo Fruit  BS,CPht II,MPH   Authorized by:   Johny Sax MD   Signed by:   Paulo Fruit  BS,CPht II,MPH on 12/08/2009   Method used:   Samples Given   RxID:   [id number] ISENTRESS 400 MG TABS (RALTEGRAVIR POTASSIUM) Take 1 tablet by mouth two times a day  #60 x 0   Entered by:   Paulo Fruit  BS,CPht II,MPH   Authorized by:   Johny Sax MD   Signed by:   Paulo Fruit  BS,CPht II,MPH on 12/08/2009   Method used:   Samples Given   RxID:   [id number] NORVIR 100 MG TABS (RITONAVIR) Take 1 tablet by mouth once a day  #30 x 0   Entered by:   Paulo Fruit  BS,CPht II,MPH   Authorized by:   Johny Sax MD   Signed by:   Paulo Fruit  BS,CPht II,MPH on 12/08/2009   Method used:   Samples Given   RxID:   [id number] REYATAZ 300 MG CAPS (ATAZANAVIR SULFATE) Take 1 tablet by mouth once a day  #30 x 0   Entered by:   Paulo Fruit  BS,CPht II,MPH   Authorized by:   Johny Sax MD   Signed by:   Paulo Fruit  BS,CPht II,MPH on 12/08/2009   Method used:   Samples Given   RxID:   [id number] AZITHROMYCIN 600 MG TABS (AZITHROMYCIN) Take 2 tabs my mouth once weekly  #8 x 0   Entered by:   Paulo Fruit  BS,CPht II,MPH   Authorized by:   Johny Sax MD   Signed by:   Paulo Fruit  BS,CPht II,MPH on 12/08/2009   Method used:   Samples Given   RxID:   [id number] BACTRIM DS 800-160 MG TABS (SULFAMETHOXAZOLE-TRIMETHOPRIM) take one tab by mouth once every monday, wed, and friday  #12 x 0   Entered by:   Paulo Fruit  BS,CPht II,MPH   Authorized by:   Johny Sax MD   Signed by:   Paulo Fruit  BS,CPht II,MPH on 12/08/2009   Method used:   Samples Given   RxID:   [id number]  Patient Assist Medication Verification: Medication name:  SMZ-TMP DS 800/160mg  RX # [id number] Tech approval:MLD  Patient Assist Medication Verification: Medication name:Azithromycin 600mg  RX # [id number] Tech approval:MLD   Patient Assist Medication Verification: Medication:Norvir 100mg  VOJ#[id number] Exp Date:18 Jan 2011 Tech approval:MLD                Patient Assist Medication Verification: Medication:Isentress 400mg  WEX#[id number] Exp Date:12 2012 Tech approval:MLD                Patient Assist Medication Verification: Medication:truvada Lot# [id number] Exp Date:07 2014 Tech approval:MLD                Patient Assist Medication Verification: Medication:Reyataz 300mg  BPZ#[id number] Exp Date:Oct 2012 Tech approval:MLD .Tried to contact patient. Was unable to leave message, no answering machine.  Will make note to try again next week. Paulo Fruit  BS,CPht II,MPH  December 08, 2009 12:45 PM

## 2010-12-18 NOTE — Assessment & Plan Note (Signed)
Summary: 32month f/u [mkj]   Primary Provider:  n/a  CC:  3 month follow up.  History of Present Illness: 45 yo M with HIV here for f/u. Was hospitalized 2-4 to 12-25-08 with lower GI bleeding. was found to have hemmerhoids and an ulcerative anal lesion (Path not done, AFB smear negative, Fungal smear negative).  Has hx of anal lesion in 2007 path- squamous atypia.  He had a GI eval (rectal bx condyloma, no malignancy 3-10). Was unable to make surgical f/u as he does not have co-pay.  Was in hospital 9-19- to 08-14-09 with PCP (DFA negative).  Rectal sores resolved. used cream, no surgery.  CD4 240 and VL 9490 (04-2010).  C/o irritation of R eye for last week. feels like there is a film over it. has been sneezing as well.   Preventive Screening-Counseling & Management  Alcohol-Tobacco     Alcohol drinks/day: 0     Smoking Status: never  Caffeine-Diet-Exercise     Caffeine use/day: tea occassionally     Does Patient Exercise: yes     Type of exercise: walking     Exercise (avg: min/session): >60     Times/week: 4  Safety-Violence-Falls     Seat Belt Use: yes   Current Allergies (reviewed today): ! * APPLE PEELINGS Past History:  Past medical, surgical, family and social histories (including risk factors) reviewed, and no changes noted (except as noted below).  Past Medical History: Hepatitis C HIV disease 02/06/06    genotype 09-2009:  K103N,M184V,G190A,L210W    genotype 04-23-10: K103N,M184V,G190A,L210W Rectal ulcer Nov.2006 GI bleed (gastric ulcer) 2-10 Rectal Warts PCP 07-2009  Family History: Reviewed history from 05/30/2008 and no changes required. denies  Social History: Reviewed history from 05/30/2008 and no changes required. Never Smoked Alcohol use-no Drug use-no  Review of Systems       no probs with meds, no probs with anal lesions, able to sit comfortably, no headache, no cp, wt up 6#. has not taken BP rx yet today.  Vital Signs:  Patient profile:    45 year old male Height:      72 inches (182.88 cm) Weight:      237.8 pounds (108.09 kg) BMI:     32.37 Temp:     98.9 degrees F (37.17 degrees C) oral Pulse rate:   93 / minute BP sitting:   151 / 99  (left arm)  Vitals Entered By: Baxter Hire) (May 07, 2010 3:25 PM) CC: 3 month follow up Pain Assessment Patient in pain? no      Nutritional Status BMI of > 30 = obese Nutritional Status Detail appetite is wonderful per patient  Have you ever been in a relationship where you felt threatened, hurt or afraid?No   Does patient need assistance? Functional Status Self care Ambulation Normal   Physical Exam  General:  well-developed, well-nourished, well-hydrated, and overweight-appearing.   Eyes:  pupils equal, pupils round, and pupils reactive to light.   L eye injected.  Mouth:  pharynx pink and moist and no exudates.   Neck:  no masses.   Lungs:  normal respiratory effort and normal breath sounds.   Heart:  normal rate, regular rhythm, and no murmur.   Abdomen:  soft, non-tender, and normal bowel sounds.          Medication Adherence: 05/07/2010   Adherence to medications reviewed with patient. Counseling to provide adequate adherence provided   Prevention For Positives: 05/07/2010   Safe sex practices discussed  with patient. Condoms offered.                             Impression & Recommendations:  Problem # 1:  HIV DISEASE (ICD-042) his CD4 is up but his VL is still up as well. will remove bactrim. he swears adherence. offered condoms. back in 3 months. may need to change to a DRVr based regiemen?  The following medications were removed from the medication list:    Bactrim Ds 800-160 Mg Tabs (Sulfamethoxazole-trimethoprim) .[name]... Take one tab by mouth once every monday, wed, and friday  Problem # 2:  HEPATITIS C (ICD-070.51) will con tot f/u his LFTs. has been vax for Hep A.  Problem # 3:  HYPERTENSION (ICD-401.9) encourage him to take his  medicine. His updated medication list for this problem includes:    Lisinopril 20 Mg Tabs (Lisinopril) .[name]... Take 1 tablet by mouth once a day  Problem # 4:  ALLERGIES-SEASONAL (ICD-477.9) suggest he try claritin or zyrtec, ocean spray eye drops or naphcon A eye drops. asked him to call if eye not improved.   Other Orders: Est. Patient Level IV (95638) Future Orders: T-CD4SP ([facility]) (CD4SP) ... 08/05/2010 T-HIV Viral Load [phone]) ... 08/05/2010 T-Comprehensive Metabolic Panel [phone]) ... 08/05/2010 T-CBC w/Diff ([id number]) ... 08/05/2010

## 2010-12-18 NOTE — Progress Notes (Signed)
Summary: ncadap meds arrived for Aug--not able to reach patient  Phone Note Refill Request      Prescriptions: REYATAZ 300 MG CAPS (ATAZANAVIR SULFATE) Take 1 tablet by mouth once a day  #30 x 0   Entered by:   Paulo Fruit  BS,CPht II,MPH   Authorized by:   Johny Sax MD   Signed by:   Paulo Fruit  BS,CPht II,MPH on 07/13/2010   Method used:   Samples Given   RxID:   [id number] NORVIR 100 MG TABS (RITONAVIR) Take 1 tablet by mouth once a day  #30 x 0   Entered by:   Paulo Fruit  BS,CPht II,MPH   Authorized by:   Johny Sax MD   Signed by:   Paulo Fruit  BS,CPht II,MPH on 07/13/2010   Method used:   Samples Given   RxID:   [id number] ISENTRESS 400 MG TABS (RALTEGRAVIR POTASSIUM) Take 1 tablet by mouth two times a day  #60 x 0   Entered by:   Paulo Fruit  BS,CPht II,MPH   Authorized by:   Johny Sax MD   Signed by:   Paulo Fruit  BS,CPht II,MPH on 07/13/2010   Method used:   Samples Given   RxID:   [id number] TRUVADA 200-300 MG TABS (EMTRICITABINE-TENOFOVIR) Take 1 tablet by mouth once a day  #30 x 0   Entered by:   Paulo Fruit  BS,CPht II,MPH   Authorized by:   Johny Sax MD   Signed by:   Paulo Fruit  BS,CPht II,MPH on 07/13/2010   Method used:   Samples Given   RxID:   [id number] VALACYCLOVIR HCL 1 GM TABS (VALACYCLOVIR HCL) Take 1 tablet by mouth two times a day  #28 x 0   Entered by:   Paulo Fruit  BS,CPht II,MPH   Authorized by:   Johny Sax MD   Signed by:   Paulo Fruit  BS,CPht II,MPH on 07/13/2010   Method used:   Samples Given   RxID:   [id number] BACTRIM DS 800-160 MG TABS (SULFAMETHOXAZOLE-TRIMETHOPRIM) one tabl Mon, Wed, Fri  #12 x 0   Entered by:   Paulo Fruit  BS,CPht II,MPH   Authorized by:   Johny Sax MD   Signed by:   Paulo Fruit  BS,CPht II,MPH on 07/13/2010   Method used:   Samples Given   RxID:   [id number]  Patient Assist Medication Verification: Medication name:  Sulfameth/trimthoprim 800/160mg  RX # [id number] Tech approval:MLD  Patient Assist Medication Verification: Medication name: Valacyclovir 1 gram RX # [id number] Tech approval:MLD  Patient Assist Medication Verification: Medication name: Norvir 100mg  RX # [id number] Tech approval:MLD  Patient Assist Medication Verification: Medication name: Truvada RX # [id number] Tech approval:MLD  Patient Assist Medication Verification: Medication name: Uvaldo Rising 400mg  RX # [id number] Tech approval:MLD  Patient Assist Medication Verification: Medication name:Reyataz 300mg  RX # [id number] Tech approval:MLD Tried to contact patient.  Unable to leave a message.  no answering machine Paulo Fruit  BS,CPht II,MPH  July 13, 2010 12:46 PM

## 2010-12-18 NOTE — Progress Notes (Signed)
Summary: NCADAP/pt assist meds arrived for May  Phone Note Refill Request      Prescriptions: TRUVADA 200-300 MG TABS (EMTRICITABINE-TENOFOVIR) Take 1 tablet by mouth once a day  #30 x 0   Entered by:   Paulo Fruit  BS,CPht II,MPH   Authorized by:   Johny Sax MD   Signed by:   Paulo Fruit  BS,CPht II,MPH on 03/26/2010   Method used:   Samples Given   RxID:   [id number] ISENTRESS 400 MG TABS (RALTEGRAVIR POTASSIUM) Take 1 tablet by mouth two times a day  #60 x 0   Entered by:   Paulo Fruit  BS,CPht II,MPH   Authorized by:   Johny Sax MD   Signed by:   Paulo Fruit  BS,CPht II,MPH on 03/26/2010   Method used:   Samples Given   RxID:   [id number] NORVIR 100 MG TABS (RITONAVIR) Take 1 tablet by mouth once a day  #30 x 0   Entered by:   Paulo Fruit  BS,CPht II,MPH   Authorized by:   Johny Sax MD   Signed by:   Paulo Fruit  BS,CPht II,MPH on 03/26/2010   Method used:   Samples Given   RxID:   [id number] REYATAZ 300 MG CAPS (ATAZANAVIR SULFATE) Take 1 tablet by mouth once a day  #30 x 0   Entered by:   Paulo Fruit  BS,CPht II,MPH   Authorized by:   Johny Sax MD   Signed by:   Paulo Fruit  BS,CPht II,MPH on 03/26/2010   Method used:   Samples Given   RxID:   [id number] BACTRIM DS 800-160 MG TABS (SULFAMETHOXAZOLE-TRIMETHOPRIM) take one tab by mouth once every monday, wed, and friday  #12 x 0   Entered by:   Paulo Fruit  BS,CPht II,MPH   Authorized by:   Johny Sax MD   Signed by:   Paulo Fruit  BS,CPht II,MPH on 03/26/2010   Method used:   Samples Given   RxID:   [id number]  Patient Assist Medication Verification: Medication name: SMZ-TMP DS 800/160mg  RX # [id number] Tech approval:MLD   Patient Assist Medication Verification: Medication:Reyataz 300mg  Lot# [id number] Exp Date:Feb 2013 Tech approval:MLD                Patient Assist Medication Verification: Medication:Isentress 400mg  Lot# [id number] Exp Date:05  2013 Tech approval:MLD                Patient Assist Medication Verification: Medication:Truvada FTD#[id number] Exp Date:08 2014 Tech approval:MLD                Patient Assist Medication Verification: Medication:Norvir 100mg  Lot# [id number] Exp Date:20 Jul 2011 Tech approval:MLD Tried to contact patient. Unable to leave message because he does not an answering machine Paulo Fruit  BS,CPht II,MPH  Mar 26, 2010 11:14 AM

## 2010-12-18 NOTE — Assessment & Plan Note (Signed)
Summary: F/U [MKJ]   Primary Provider:  n/a  CC:  follow-up visit and lab results.  History of Present Illness: 45 yo M HIV+. Prev hospitalized 2-4 to 12-25-08 with lower GI bleeding. was found to have hemmerhoids and an ulcerative anal lesion (Path not done, AFB smear negative, Fungal smear negative).  Has hx of anal lesion in 2007 path- squamous atypia. He had a GI eval (rectal bx condyloma, no malignancy 3-10). Was unable to make surgical f/u as he does not have co-pay.   Was in hospital 9-19- to 08-14-09 with suspected PCP (DFA negative).  Rectal sores resolved. used cream, no surgery.   Was seen 06-25-10 with peri-rectal cellulitis and very large rectal condyloma. Was referred to surgery. 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  He was started on Valcyte, prednisone eye drops and orally. His vision contniues to improve.  otherwise he has been feeling well. His rectal lesion continues to heal. He is getting orange card so that he can have this lesion removed.  Last CD4 250 and VL 181 (08-14-10). got flu shot today.    Preventive Screening-Counseling & Management  Alcohol-Tobacco     Alcohol drinks/day: 0     Smoking Status: never  Caffeine-Diet-Exercise     Caffeine use/day: tea occassionally     Does Patient Exercise: yes     Type of exercise: walking     Exercise (avg: min/session): >60     Times/week: 4  Safety-Violence-Falls     Seat Belt Use: yes      Drug Use:  never.    Comments: pt. declined condoms   Updated Prior Medication List: LISINOPRIL 20 MG TABS (LISINOPRIL) Take 1 tablet by mouth once a day REYATAZ 300 MG CAPS (ATAZANAVIR SULFATE) Take 1 tablet by mouth once a day NORVIR 100 MG TABS (RITONAVIR) Take 1 tablet by mouth once a day ISENTRESS 400 MG TABS (RALTEGRAVIR POTASSIUM) Take 1 tablet by mouth two times a day TRUVADA 200-300 MG TABS (EMTRICITABINE-TENOFOVIR) Take 1 tablet by mouth once a day VALACYCLOVIR HCL 1 GM TABS (VALACYCLOVIR HCL)  Take 1 tablet by mouth two times a day PREDNISONE 5 MG TABS (PREDNISONE) one and 1/2 tab once daily  Current Allergies (reviewed today): ! * APPLE PEELINGS Vital Signs:  Patient profile:   45 year old male Height:      72 inches (182.88 cm) Weight:      243.8 pounds (110.82 kg) BMI:     33.18 Temp:     99.1 degrees F (37.28 degrees C) oral Pulse rate:   97 / minute BP sitting:   144 / 87  (left arm)  Vitals Entered By: Wendall Mola CMA Duncan Dull) (September 26, 2010 11:02 AM) CC: follow-up visit, lab results Is Patient Diabetic? No Pain Assessment Patient in pain? no      Nutritional Status BMI of > 30 = obese Nutritional Status Detail appetite "good"  Have you ever been in a relationship where you felt threatened, hurt or afraid?No   Does patient need assistance? Functional Status Self care Ambulation Normal Comments no missed doses of meds per pt.   Physical Exam  General:  well-developed, well-nourished, and well-hydrated.   Eyes:  pupils equal, pupils round, and pupils reactive to light.  injection in R eye.  Mouth:  pharynx pink and moist and no exudates.   Neck:  no masses.   Lungs:  normal respiratory effort and normal breath sounds.  Heart:  normal rate, regular rhythm, and no murmur.   Abdomen:  soft, non-tender, and normal bowel sounds.     Impression & Recommendations:  Problem # 1:  HIV DISEASE (ICD-042) can stop bactrim, CD4 over 200. he is doing very well, mostly thanks to the consultants who are helping manage his complicated comorbidities.  offered condoms, gets flu shot today.  return to clinic 4-5 months.  The following medications were removed from the medication list:    Doxycycline Hyclate 100 Mg Caps (Doxycycline hyclate) .[name]... Take 1 tablet by mouth two times a day    Bactrim Ds 800-160 Mg Tabs (Sulfamethoxazole-trimethoprim) ..... One tabl mon, wed, fri His updated medication list for this problem includes:    Valacyclovir Hcl 1 Gm  Tabs (Valacyclovir hcl) .[name]... Take 1 tablet by mouth two times a day  Problem # 2:  ULCER, RECTUM (ICD-569.41) has surgical f/u for resection. greatly appreciate their partnering with [place].   Problem # 3:  HYPERTENSION (ICD-401.9) moderately well controlled. will cont to follow His updated medication list for this problem includes:    Lisinopril 20 Mg Tabs (Lisinopril) .[name]... Take 1 tablet by mouth once a day  Problem # 4:  HEPATITIS C (ICD-070.51) will continue to follow his LFTs (nl 08-14-10). has had Hep A vax.  Problem # 5:  RETINITIS (ICD-363.20) appears to be improving. my great appreciation to Dr Matilde Haymaker.   Other Orders: Influenza Vaccine NON MCR (65784) Est. Patient Level IV (69629) Future Orders: T-CD4SP ([facility]) (CD4SP) ... 12/25/2010 T-HIV Viral Load [phone]) ... 12/25/2010 T-Comprehensive Metabolic Panel [phone]) ... 12/25/2010 T-CBC w/Diff ([id number]) ... 12/25/2010 T-RPR (Syphilis) [phone]) ... 12/25/2010 T-Lipid Profile [phone]) ... 12/25/2010         Medication Adherence: 09/26/2010   Adherence to medications reviewed with patient. Counseling to provide adequate adherence provided   Prevention For Positives: 09/26/2010   Safe sex practices discussed with patient. Condoms offered.                              Immunizations Administered:  Influenza Vaccine # 1:    Vaccine Type: Fluvax Non-MCR    Site: left deltoid    Mfr: Novartis    Dose: 0.5 ml    Route: IM    Given by: Wendall Mola CMA ( AAMA)    Exp. Date: 02/17/2011    Lot #: [id number]    VIS given: 06/12/10 version given September 26, 2010.  Flu Vaccine Consent Questions:    Do you have a history of severe allergic reactions to this vaccine? no    Any prior history of allergic reactions to egg and/or gelatin? no    Do you have a sensitivity to the preservative Thimersol? no    Do you have a past history of Guillan-Barre Syndrome? no    Do you currently have an  acute febrile illness? no    Have you ever had a severe reaction to latex? no    Vaccine information given and explained to patient? yes

## 2010-12-18 NOTE — Progress Notes (Signed)
Summary: [facility]ADAP/pt assist meds arrived for [name]  Phone Note Refill Request   Caller: [facility] [address] Summary of Call: Pharmacy called to let me know that [name]'s medication will be arriving today.  They wanted to call and let me know that they forgot to put the Bactrim DS in his order.  They will send that medicatin for Thursday. Initial call taken by: Paulo Fruit  BS,CPht II,MPH,  May 16, 2010 9:06 AM  Follow-up for Phone Call        patient is no longer on Bactrim.    Pharmacy notified. Follow-up by: Paulo Fruit  BS,CPht II,MPH,  May 17, 2010 10:37 AM    Prescriptions: TRUVADA 200-300 MG TABS (EMTRICITABINE-TENOFOVIR) Take 1 tablet by mouth once a day  #30 x 0   Entered by:   Paulo Fruit  BS,CPht II,MPH   Authorized by:   Johny Sax MD   Signed by:   Paulo Fruit  BS,CPht II,MPH on 05/17/2010   Method used:   Samples Given   RxID:   [id number] ISENTRESS 400 MG TABS (RALTEGRAVIR POTASSIUM) Take 1 tablet by mouth two times a day  #60 x 0   Entered by:   Paulo Fruit  BS,CPht II,MPH   Authorized by:   Johny Sax MD   Signed by:   Paulo Fruit  BS,CPht II,MPH on 05/17/2010   Method used:   Samples Given   RxID:   [id number] NORVIR 100 MG TABS (RITONAVIR) Take 1 tablet by mouth once a day  #30 x 0   Entered by:   Paulo Fruit  BS,CPht II,MPH   Authorized by:   Johny Sax MD   Signed by:   Paulo Fruit  BS,CPht II,MPH on 05/17/2010   Method used:   Samples Given   RxID:   [id number] REYATAZ 300 MG CAPS (ATAZANAVIR SULFATE) Take 1 tablet by mouth once a day  #30 x 0   Entered by:   Paulo Fruit  BS,CPht II,MPH   Authorized by:   Johny Sax MD   Signed by:   Paulo Fruit  BS,CPht II,MPH on 05/17/2010   Method used:   Samples Given   RxID:   [id number]  Patient Assist Medication Verification: Medication name: Truvada RX # [id number] Tech approval:MLD  Patient Assist Medication Verification: Medication  name:Isentress 400mg  RX # [id number] Tech approval:MLD  Patient Assist Medication Verification: Medication name:Reyataz 300mg  RX # [id number] Tech approval:MLD  Patient Assist Medication Verification: Medication name:Norvir 100mg  RX # [id number] Tech approval:MLD Tried to contact patient. No answer. Paulo Fruit  BS,CPht II,MPH  May 17, 2010 10:39 AM

## 2011-01-10 ENCOUNTER — Encounter (INDEPENDENT_AMBULATORY_CARE_PROVIDER_SITE_OTHER): Payer: Self-pay | Admitting: *Deleted

## 2011-01-15 NOTE — Miscellaneous (Signed)
Summary: adap UPDATE   Clinical Lists Changes  Observations: Added new observation of AIDSDAP: Pending-APPROVAL 2012 (01/10/2011 12:16) Added new observation of INCOMESOURCE: PART TIME WAGES (01/10/2011 12:16) Added new observation of PCTFPL: 20.40  (01/10/2011 12:16) Added new observation of HOUSEINCOME: 2209  (01/10/2011 12:16) Added new observation of YEARLYEXPEN: 0  (01/10/2011 12:16) Added new observation of FINASSESSDT: 01/10/2011  (01/10/2011 12:16)

## 2011-01-17 ENCOUNTER — Emergency Department (HOSPITAL_COMMUNITY)
Admission: EM | Admit: 2011-01-17 | Discharge: 2011-01-17 | Disposition: A | Discharge status: Home / Self Care | Payer: Self-pay | Attending: Emergency Medicine | Admitting: Emergency Medicine

## 2011-01-17 DIAGNOSIS — Z79899 Other long term (current) drug therapy: Secondary | ICD-10-CM | POA: Insufficient documentation

## 2011-01-17 DIAGNOSIS — I10 Essential (primary) hypertension: Secondary | ICD-10-CM | POA: Insufficient documentation

## 2011-01-17 DIAGNOSIS — IMO0002 Reserved for concepts with insufficient information to code with codable children: Secondary | ICD-10-CM | POA: Insufficient documentation

## 2011-01-17 DIAGNOSIS — Z21 Asymptomatic human immunodeficiency virus [HIV] infection status: Secondary | ICD-10-CM | POA: Insufficient documentation

## 2011-01-17 DIAGNOSIS — H5789 Other specified disorders of eye and adnexa: Secondary | ICD-10-CM | POA: Insufficient documentation

## 2011-01-17 DIAGNOSIS — S058X9A Other injuries of unspecified eye and orbit, initial encounter: Secondary | ICD-10-CM | POA: Insufficient documentation

## 2011-01-17 DIAGNOSIS — H5710 Ocular pain, unspecified eye: Secondary | ICD-10-CM | POA: Insufficient documentation

## 2011-01-17 DIAGNOSIS — Y92009 Unspecified place in unspecified non-institutional (private) residence as the place of occurrence of the external cause: Secondary | ICD-10-CM | POA: Insufficient documentation

## 2011-01-21 ENCOUNTER — Emergency Department (HOSPITAL_COMMUNITY)
Admission: EM | Admit: 2011-01-21 | Discharge: 2011-01-22 | Disposition: A | Discharge status: Home / Self Care | Payer: Self-pay | Attending: Emergency Medicine | Admitting: Emergency Medicine

## 2011-01-21 DIAGNOSIS — I10 Essential (primary) hypertension: Secondary | ICD-10-CM | POA: Insufficient documentation

## 2011-01-21 DIAGNOSIS — W268XXA Contact with other sharp object(s), not elsewhere classified, initial encounter: Secondary | ICD-10-CM | POA: Insufficient documentation

## 2011-01-21 DIAGNOSIS — Z21 Asymptomatic human immunodeficiency virus [HIV] infection status: Secondary | ICD-10-CM | POA: Insufficient documentation

## 2011-01-21 DIAGNOSIS — M79609 Pain in unspecified limb: Secondary | ICD-10-CM | POA: Insufficient documentation

## 2011-01-21 DIAGNOSIS — S91309A Unspecified open wound, unspecified foot, initial encounter: Secondary | ICD-10-CM | POA: Insufficient documentation

## 2011-01-21 DIAGNOSIS — S8990XA Unspecified injury of unspecified lower leg, initial encounter: Secondary | ICD-10-CM | POA: Insufficient documentation

## 2011-01-21 DIAGNOSIS — S99929A Unspecified injury of unspecified foot, initial encounter: Secondary | ICD-10-CM | POA: Insufficient documentation

## 2011-01-21 DIAGNOSIS — Z79899 Other long term (current) drug therapy: Secondary | ICD-10-CM | POA: Insufficient documentation

## 2011-01-22 ENCOUNTER — Emergency Department (HOSPITAL_COMMUNITY): Payer: Self-pay

## 2011-01-31 LAB — T-HELPER CELL (CD4) - (RCID CLINIC ONLY): CD4 % Helper T Cell: 11 % — ABNORMAL LOW (ref 33–55)

## 2011-02-01 LAB — T-HELPER CELL (CD4) - (RCID CLINIC ONLY)
CD4 % Helper T Cell: 12 % — ABNORMAL LOW (ref 33–55)
CD4 T Cell Abs: 170 uL — ABNORMAL LOW (ref 400–2700)

## 2011-02-04 LAB — T-HELPER CELL (CD4) - (RCID CLINIC ONLY): CD4 T Cell Abs: 240 uL — ABNORMAL LOW (ref 400–2700)

## 2011-02-11 ENCOUNTER — Other Ambulatory Visit: Payer: Self-pay

## 2011-02-13 ENCOUNTER — Other Ambulatory Visit: Payer: Self-pay

## 2011-02-22 LAB — COMPREHENSIVE METABOLIC PANEL
AST: 27 U/L (ref 0–37)
Albumin: 3 g/dL — ABNORMAL LOW (ref 3.5–5.2)
Alkaline Phosphatase: 46 U/L (ref 39–117)
Chloride: 102 mEq/L (ref 96–112)
GFR calc Af Amer: >60 mL/min (ref >60)
Potassium: 2.9 mEq/L — ABNORMAL LOW (ref 3.5–5.1)
Sodium: 135 mEq/L (ref 135–145)
Total Bilirubin: 0.9 mg/dL (ref 0.3–1.2)
Total Protein: 7.7 g/dL (ref 6.0–8.3)

## 2011-02-22 LAB — URINALYSIS, ROUTINE W REFLEX MICROSCOPIC
Bilirubin Urine: NEGATIVE
Hgb urine dipstick: NEGATIVE
Nitrite: NEGATIVE
Protein, ur: NEGATIVE mg/dL
Specific Gravity, Urine: 1.014 (ref 1.005–1.030)
Urobilinogen, UA: 2 mg/dL — ABNORMAL HIGH (ref 0.0–1.0)

## 2011-02-22 LAB — DIFFERENTIAL
Basophils Absolute: 0 10*3/uL (ref 0.0–0.1)
Basophils Relative: 0 % (ref 0–1)
Eosinophils Relative: 0 % (ref 0–5)
Monocytes Absolute: 0.7 10*3/uL (ref 0.1–1.0)
Monocytes Relative: 10 % (ref 3–12)

## 2011-02-22 LAB — BASIC METABOLIC PANEL
BUN: 13 mg/dL (ref 6–23)
BUN: 2 mg/dL — ABNORMAL LOW (ref 6–23)
BUN: 2 mg/dL — ABNORMAL LOW (ref 6–23)
CO2: 19 mEq/L (ref 19–32)
CO2: 20 mEq/L (ref 19–32)
CO2: 22 mEq/L (ref 19–32)
CO2: 22 mEq/L (ref 19–32)
CO2: 24 mEq/L (ref 19–32)
Calcium: 8.2 mg/dL — ABNORMAL LOW (ref 8.4–10.5)
Calcium: 8.4 mg/dL (ref 8.4–10.5)
Calcium: 8.5 mg/dL (ref 8.4–10.5)
Chloride: 100 mEq/L (ref 96–112)
Chloride: 100 mEq/L (ref 96–112)
Chloride: 100 mEq/L (ref 96–112)
Chloride: 102 mEq/L (ref 96–112)
Chloride: 103 mEq/L (ref 96–112)
Chloride: 97 mEq/L (ref 96–112)
Chloride: 99 mEq/L (ref 96–112)
Creatinine, Ser: 0.54 mg/dL (ref 0.4–1.5)
Creatinine, Ser: 0.62 mg/dL (ref 0.4–1.5)
Creatinine, Ser: 0.67 mg/dL (ref 0.4–1.5)
Creatinine, Ser: 0.73 mg/dL (ref 0.4–1.5)
Creatinine, Ser: 0.79 mg/dL (ref 0.4–1.5)
GFR calc Af Amer: >60 mL/min (ref >60)
GFR calc Af Amer: >60 mL/min (ref >60)
GFR calc Af Amer: >60 mL/min (ref >60)
GFR calc Af Amer: >60 mL/min (ref >60)
GFR calc Af Amer: >60 mL/min (ref >60)
GFR calc Af Amer: >60 mL/min (ref >60)
GFR calc non Af Amer: >60 mL/min (ref >60)
GFR calc non Af Amer: >60 mL/min (ref >60)
GFR calc non Af Amer: >60 mL/min (ref >60)
GFR calc non Af Amer: >60 mL/min (ref >60)
Glucose, Bld: 116 mg/dL — ABNORMAL HIGH (ref 70–99)
Glucose, Bld: 148 mg/dL — ABNORMAL HIGH (ref 70–99)
Glucose, Bld: 168 mg/dL — ABNORMAL HIGH (ref 70–99)
Glucose, Bld: 86 mg/dL (ref 70–99)
Glucose, Bld: 94 mg/dL (ref 70–99)
Potassium: 2.9 mEq/L — ABNORMAL LOW (ref 3.5–5.1)
Potassium: 3.5 mEq/L (ref 3.5–5.1)
Potassium: 3.7 mEq/L (ref 3.5–5.1)
Potassium: 4.1 mEq/L (ref 3.5–5.1)
Sodium: 127 mEq/L — ABNORMAL LOW (ref 135–145)
Sodium: 130 mEq/L — ABNORMAL LOW (ref 135–145)
Sodium: 131 mEq/L — ABNORMAL LOW (ref 135–145)
Sodium: 132 mEq/L — ABNORMAL LOW (ref 135–145)
Sodium: 133 mEq/L — ABNORMAL LOW (ref 135–145)
Sodium: 135 mEq/L (ref 135–145)

## 2011-02-22 LAB — CBC
HCT: 31.1 % — ABNORMAL LOW (ref 39.0–52.0)
HCT: 31.4 % — ABNORMAL LOW (ref 39.0–52.0)
Hemoglobin: 10.3 g/dL — ABNORMAL LOW (ref 13.0–17.0)
Hemoglobin: 10.6 g/dL — ABNORMAL LOW (ref 13.0–17.0)
Hemoglobin: 11 g/dL — ABNORMAL LOW (ref 13.0–17.0)
Hemoglobin: 11.5 g/dL — ABNORMAL LOW (ref 13.0–17.0)
MCHC: 34.4 g/dL (ref 30.0–36.0)
MCHC: 35 g/dL (ref 30.0–36.0)
MCV: 100.7 fL — ABNORMAL HIGH (ref 78.0–100.0)
MCV: 100.8 fL — ABNORMAL HIGH (ref 78.0–100.0)
MCV: 101.5 fL — ABNORMAL HIGH (ref 78.0–100.0)
MCV: 102.3 fL — ABNORMAL HIGH (ref 78.0–100.0)
Platelets: 381 10*3/uL (ref 150–400)
RBC: 2.91 MIL/uL — ABNORMAL LOW (ref 4.22–5.81)
RBC: 3.1 MIL/uL — ABNORMAL LOW (ref 4.22–5.81)
RBC: 3.24 MIL/uL — ABNORMAL LOW (ref 4.22–5.81)
RBC: 3.25 MIL/uL — ABNORMAL LOW (ref 4.22–5.81)
RDW: 13 % (ref 11.5–15.5)
WBC: 11.3 10*3/uL — ABNORMAL HIGH (ref 4.0–10.5)
WBC: 6.9 10*3/uL (ref 4.0–10.5)
WBC: 8.1 10*3/uL (ref 4.0–10.5)
WBC: 9.4 10*3/uL (ref 4.0–10.5)

## 2011-02-22 LAB — AFB CULTURE WITH SMEAR (NOT AT ARMC)
Acid Fast Smear: NONE SEEN
Acid Fast Smear: NONE SEEN

## 2011-02-22 LAB — EXPECTORATED SPUTUM ASSESSMENT W GRAM STAIN, RFLX TO RESP C

## 2011-02-22 LAB — BLOOD GAS, ARTERIAL
Bicarbonate: 19.6 mEq/L — ABNORMAL LOW (ref 20.0–24.0)
TCO2: 20.4 mmol/L (ref 0–100)
pCO2 arterial: 24.5 mmHg — ABNORMAL LOW (ref 35.0–45.0)
pH, Arterial: 7.514 — ABNORMAL HIGH (ref 7.350–7.450)

## 2011-02-22 LAB — CULTURE, BLOOD (ROUTINE X 2)
Culture: NO GROWTH
Culture: NO GROWTH

## 2011-02-22 LAB — PNEUMOCYSTIS JIROVECI SMEAR BY DFA
Pneumocystis jiroveci Ag: NOT DETECTED
Pneumocystis jiroveci Ag: NOT DETECTED

## 2011-02-22 LAB — CRYPTOCOCCAL ANTIGEN: Crypto Ag: NEGATIVE

## 2011-02-22 LAB — URINE CULTURE

## 2011-02-22 LAB — T-HELPER CELLS (CD4) COUNT (NOT AT ARMC): CD4 % Helper T Cell: 6 % — ABNORMAL LOW (ref 33–55)

## 2011-02-22 LAB — HIV-1 GENOTYPR PLUS

## 2011-02-25 ENCOUNTER — Ambulatory Visit: Payer: Self-pay | Admitting: Infectious Diseases

## 2011-02-28 ENCOUNTER — Ambulatory Visit: Payer: Self-pay | Admitting: Infectious Diseases

## 2011-03-03 ENCOUNTER — Other Ambulatory Visit: Payer: Self-pay | Admitting: Infectious Diseases

## 2011-03-03 DIAGNOSIS — B009 Herpesviral infection, unspecified: Secondary | ICD-10-CM

## 2011-03-05 LAB — BASIC METABOLIC PANEL
BUN: 6 mg/dL (ref 6–23)
BUN: 7 mg/dL (ref 6–23)
CO2: 24 mEq/L (ref 19–32)
CO2: 25 mEq/L (ref 19–32)
CO2: 26 mEq/L (ref 19–32)
Calcium: 8 mg/dL — ABNORMAL LOW (ref 8.4–10.5)
Chloride: 111 mEq/L (ref 96–112)
GFR calc non Af Amer: >60 mL/min (ref >60)
GFR calc non Af Amer: >60 mL/min (ref >60)
Glucose, Bld: 104 mg/dL — ABNORMAL HIGH (ref 70–99)
Glucose, Bld: 108 mg/dL — ABNORMAL HIGH (ref 70–99)
Glucose, Bld: 82 mg/dL (ref 70–99)
Potassium: 2.8 mEq/L — ABNORMAL LOW (ref 3.5–5.1)
Potassium: 3.2 mEq/L — ABNORMAL LOW (ref 3.5–5.1)
Sodium: 144 mEq/L (ref 135–145)

## 2011-03-05 LAB — HEMOGLOBIN AND HEMATOCRIT, BLOOD
HCT: 24.8 % — ABNORMAL LOW (ref 39.0–52.0)
HCT: 26.6 % — ABNORMAL LOW (ref 39.0–52.0)
HCT: 28.8 % — ABNORMAL LOW (ref 39.0–52.0)
HCT: 34.1 % — ABNORMAL LOW (ref 39.0–52.0)
Hemoglobin: 10.1 g/dL — ABNORMAL LOW (ref 13.0–17.0)
Hemoglobin: 9.6 g/dL — ABNORMAL LOW (ref 13.0–17.0)
Hemoglobin: 9.6 g/dL — ABNORMAL LOW (ref 13.0–17.0)

## 2011-03-05 LAB — COMPREHENSIVE METABOLIC PANEL
ALT: 23 U/L (ref 0–53)
AST: 29 U/L (ref 0–37)
Albumin: 3 g/dL — ABNORMAL LOW (ref 3.5–5.2)
Alkaline Phosphatase: 44 U/L (ref 39–117)
BUN: 4 mg/dL — ABNORMAL LOW (ref 6–23)
Chloride: 107 mEq/L (ref 96–112)
Potassium: 3 mEq/L — ABNORMAL LOW (ref 3.5–5.1)
Sodium: 138 mEq/L (ref 135–145)
Total Bilirubin: 2.2 mg/dL — ABNORMAL HIGH (ref 0.3–1.2)

## 2011-03-05 LAB — DIFFERENTIAL
Eosinophils Absolute: 0.1 10*3/uL (ref 0.0–0.7)
Eosinophils Relative: 1 % (ref 0–5)
Lymphocytes Relative: 22 % (ref 12–46)
Lymphs Abs: 1.1 10*3/uL (ref 0.7–4.0)
Monocytes Relative: 10 % (ref 3–12)

## 2011-03-05 LAB — AFB CULTURE WITH SMEAR (NOT AT ARMC): Acid Fast Smear: NONE SEEN

## 2011-03-05 LAB — CBC
HCT: 26.8 % — ABNORMAL LOW (ref 39.0–52.0)
HCT: 34.7 % — ABNORMAL LOW (ref 39.0–52.0)
Hemoglobin: 9.5 g/dL — ABNORMAL LOW (ref 13.0–17.0)
MCHC: 35 g/dL (ref 30.0–36.0)
MCHC: 35.4 g/dL (ref 30.0–36.0)
MCHC: 35.4 g/dL (ref 30.0–36.0)
MCV: 100.1 fL — ABNORMAL HIGH (ref 78.0–100.0)
MCV: 102.1 fL — ABNORMAL HIGH (ref 78.0–100.0)
MCV: 103.1 fL — ABNORMAL HIGH (ref 78.0–100.0)
Platelets: 234 10*3/uL (ref 150–400)
RBC: 2.68 MIL/uL — ABNORMAL LOW (ref 4.22–5.81)
RBC: 2.91 MIL/uL — ABNORMAL LOW (ref 4.22–5.81)
RBC: 3.37 MIL/uL — ABNORMAL LOW (ref 4.22–5.81)
RDW: 15.5 % (ref 11.5–15.5)
RDW: 15.8 % — ABNORMAL HIGH (ref 11.5–15.5)
WBC: 3.8 10*3/uL — ABNORMAL LOW (ref 4.0–10.5)
WBC: 4.9 10*3/uL (ref 4.0–10.5)

## 2011-03-05 LAB — CROSSMATCH
ABO/RH(D): O POS
Antibody Screen: NEGATIVE

## 2011-03-05 LAB — FUNGUS CULTURE W SMEAR

## 2011-03-05 LAB — RPR: RPR Ser Ql: NONREACTIVE

## 2011-03-05 LAB — GC/CHLAMYDIA PROBE AMP, URINE: Chlamydia, Swab/Urine, PCR: NEGATIVE

## 2011-03-05 LAB — MISCELLANEOUS TEST

## 2011-03-05 LAB — PROTIME-INR: INR: 1.1 (ref 0.00–1.49)

## 2011-03-26 ENCOUNTER — Other Ambulatory Visit (INDEPENDENT_AMBULATORY_CARE_PROVIDER_SITE_OTHER): Payer: Self-pay

## 2011-03-26 DIAGNOSIS — B20 Human immunodeficiency virus [HIV] disease: Secondary | ICD-10-CM

## 2011-03-27 LAB — CBC WITH DIFFERENTIAL/PLATELET
Eosinophils Absolute: 0.2 10*3/uL (ref 0.0–0.7)
Eosinophils Relative: 3 % (ref 0–5)
HCT: 40.6 % (ref 39.0–52.0)
Hemoglobin: 14.2 g/dL (ref 13.0–17.0)
Lymphocytes Relative: 46 % (ref 12–46)
Lymphs Abs: 2.4 10*3/uL (ref 0.7–4.0)
MCH: 33.8 pg (ref 26.0–34.0)
MCV: 96.7 fL (ref 78.0–100.0)
Monocytes Absolute: 0.6 10*3/uL (ref 0.1–1.0)
Monocytes Relative: 11 % (ref 3–12)
Platelets: 264 10*3/uL (ref 150–400)
RBC: 4.2 MIL/uL — ABNORMAL LOW (ref 4.22–5.81)
WBC: 5.1 10*3/uL (ref 4.0–10.5)

## 2011-03-27 LAB — COMPLETE METABOLIC PANEL WITH GFR
ALT: 38 U/L (ref 0–53)
AST: 27 U/L (ref 0–37)
Alkaline Phosphatase: 70 U/L (ref 39–117)
BUN: 8 mg/dL (ref 6–23)
Calcium: 9.2 mg/dL (ref 8.4–10.5)
Chloride: 106 mEq/L (ref 96–112)
Creat: 0.99 mg/dL (ref 0.40–1.50)
Potassium: 3.8 mEq/L (ref 3.5–5.3)

## 2011-03-27 LAB — LIPID PANEL
HDL: 37 mg/dL — ABNORMAL LOW (ref >39)
LDL Cholesterol: 64 mg/dL (ref 0–99)
Total CHOL/HDL Ratio: 3.6 Ratio
Triglycerides: 161 mg/dL — ABNORMAL HIGH (ref <150)
VLDL: 32 mg/dL (ref 0–40)

## 2011-03-27 LAB — HIV-1 RNA QUANT-NO REFLEX-BLD
HIV 1 RNA Quant: 8550 copies/mL — ABNORMAL HIGH (ref <20)
HIV-1 RNA Quant, Log: 3.93 {Log} — ABNORMAL HIGH (ref <1.30)

## 2011-03-27 LAB — T-HELPER CELL (CD4) - (RCID CLINIC ONLY): CD4 T Cell Abs: 350 uL — ABNORMAL LOW (ref 400–2700)

## 2011-03-31 ENCOUNTER — Other Ambulatory Visit: Payer: Self-pay | Admitting: Infectious Diseases

## 2011-03-31 DIAGNOSIS — B009 Herpesviral infection, unspecified: Secondary | ICD-10-CM

## 2011-04-02 NOTE — Discharge Summary (Signed)
[name], [name] NO.:  [id number]   MEDICAL RECORD NO.:  [id number]          PATIENT TYPE:  INP   LOCATION:  5023                         FACILITY:  [facility]   PHYSICIAN:  Paul Lav, MD  DATE OF BIRTH:  Mar 11, 1966   DATE OF ADMISSION:  12/22/2008  DATE OF DISCHARGE:  12/25/2008                               DISCHARGE SUMMARY   DISCHARGE DIAGNOSES:  1. Lower gastrointestinal bleed secondary to external hemorrhoids.  2. Anal papular lesion, status post biopsy pending.  3. Distal rectal inflammation without frank ulceration, status post      biopsy.  4. Human immunodeficiency virus with CD-4 count of 40.  5. Hypokalemia.  6. Hypotension.   DISCHARGE MEDICATIONS:  1. K-Dur 20 mEq p.o. daily.  2. Bactrim double strength 1 tablet once every Monday, Wednesday, and      Friday.  3. Erythromycin 200 mg 1 tablet p.o. weekly.  4. Reyataz 300 mg 1 tablet daily.  5. Combivir 150/300 mg 1 tablet daily.  6. Norvir 100 mg p.o. daily.  7. Viread 300 mg p.o. daily.   The patient is advised not to take lisinopril and hydrochlorothiazide,  which is a combined pill of 10/12.5 mg daily, which was his preadmission  medications for about a week.  The patient is instructed to resume these  medications up to a week.   DISPOSITION AND FOLLOWUP:  [name] is discharged home.  He has  followup appointment with Dr. Johny Meyers in the Outpatient  Clinic/[facility] Clinic.  Followup issues include:  1. Please get his CBC to check the hemoglobin as well as BMET to check      potassium.  2. Please followup on the pending labs including fungal and AFB      culture of the biopsy as well as the pathology of the biopsy for      both the inflammation in the rectum as well as the anal papular      lesion.  3. Please note that the patient is sent home without      hydrochlorothiazide and lisinopril combination for about a week and      then he will resume this.  Please followup this  as well.   CONSULTATIONS DONE DURING THIS HOSPITALIZATION:  GI consultation with  [name]. Paul Chance, MD and Paul Fee, MD.   PROCEDURES DONE DURING THIS HOSPITALIZATION:  Flexible sigmoidoscopy on  December 23, 2008.   BRIEF HISTORY OF PRESENT ILLNESS:  [name] is a 45 year old gentleman  with a history of HIV, CD-4 count of 40, who is on antiretroviral  therapy, developed painless rectal bleeding on the day of admission.  He  was admitted on December 22, 2008.  This has been going on for last 6  hours prior to admission.  The bleeding is bright red, which is  streaking through constantly.  The patient does not report loss of  consciousness, dizziness, abdominal pain, fever, chills, diarrhea,  headache, traumatic injury.   PHYSICAL EXAMINATION:  VITAL SIGNS:  Temperature 97.3, blood pressure  176/105, pulse 102, respirations 16, and oxygen saturation 99% on  room  air.  GENERAL:  He is well developed, well nourished without any acute  distress.  EYES:  Extraocular muscles are intact.  PERRLA.  No icterus.  ENT:  Moist mucosa.  No thrush.  NECK:  Supple.  Range of motion is full.  No thyromegaly.  CHEST:  Clear to auscultation.  No rhonchi, rales, or wheezes.  CARDIOVASCULAR:  First and second heart sounds normal.  GI:  Soft and nontender.  EXTREMITIES:  No skin lesions.  No edema.  No cyanosis.  RECTAL:  Gross blood; open, ruptured hemorrhoid; open wound; anterior  portion of upper rectum seen.  No lymphadenopathy noted.  PSYCHIATRY:  Appropriate   LABORATORY DATA AT ADMISSION:  WBC 4.9, hemoglobin 12.1, ANC 3.3, and  platelets 239.  PTT 32, PT 14.1, and INR 1.1.  Sodium 140, potassium  2.8, chloride 108, bicarb 25, BUN 6, creatinine 0.64, glucose 104, and  calcium 8.2.   HOSPITAL COURSE:  1. Lower GI Bleed.  The patient was admitted to floor where he was      started on 2 large-bore IVs.  He was given normal saline bolus      followed by good amount of maintenance fluid.   Type and screen and      his blood.  H and H was followed every 4 hours initially.  He      continued to have some bleeding within the first 3 hours of      admission.  His lowest hemoglobin was 8.9, but it stabilized      between 9-11.  [facility] was consulted and Dr. Lina Meyers came in      and saw the patient.  The patient had flexible sigmoidoscopy on the      second day of admission.  It was positive for external hemorrhoids      with anal papular lesion which was biopsied, as well as some distal      rectal inflammation, but no frank ulceration.  Then, the GI      recommendation was conservative approach with treatment of his HIV.      The primary team did send for GC and Chlamydia, which was negative.      Please note that multiple cultures have been sent including AFB      culture, fungal culture.  The lesion is also sent for biopsy.  We      are going to follow up on this.  Please note that after the second      day of admission, the patient did not have any bleeding at all.      His hemoglobin was stable.  He did not have any symptoms of      dizziness or any other symptoms from hemodynamic compromise.  2. HIV.  The patient's antiretroviral regimen was continued in the      hospital.  He was also started on MAC as well as PCP prophylaxis      with azithromycin and Bactrim respectively.  3. Hypokalemia.  The patient did have some hypokalemia.  The lowest      potassium was 2.8.  Magnesium was checked and was normal at 2.1.      His potassium was repleted and rechecked and on the day of      discharge, it was 3.5.  [name] is discharged home with some      p.o. potassium supplements.  4. Hypotension.  We held his hydrochlorothiazide and lisinopril  combination.  He is instructed to resume it 7 days after discharge.   DISCHARGE VITALS:  Temperature 98, pulse 85, respirations 16, and blood  pressure 109/71.   CONDITION ON DISCHARGE:  There is minimal bleeding if  any.   LABORATORY DATA ON DISCHARGE:  WBC 3.8, hemoglobin 10.3, MCV 100.4, and  platelets 234.  Magnesium 2, potassium 3.5, sodium 135, chloride 106,  bicarb 24, glucose 82, BUN 7, creatinine 0.6, and calcium 8.0.      Paul Coop, MD  Electronically Signed      Paul Lav, MD  Electronically Signed    YP/MEDQ  D:  12/26/2008  T:  12/27/2008  Job:  [id number]   cc:   Lucy Antigua, MD  Paul Fee, MD  [name]. Paul Chance, MD

## 2011-04-05 NOTE — Assessment & Plan Note (Signed)
[facility]                           GASTROENTEROLOGY OFFICE NOTE   NAME:Stilley, [place] GROSSER                    MRN:          [id number]  DATE:05/27/2006                            DOB:          09-30-1966    GI PROBLEM LIST:  Rectal ulcer. Presented with anal pain for approximately 2  months.  Was found to have anal fissure. Colonoscopy October 08, 2005,  found large rectal ulcer, approximately 3 cm, located just proximal to the  anal verge. This was biopsied multiple times. Biopsies showed only  inflammation and granulation tissue; no cytomegalovirus was identified.  Canasa suppositories twice daily helped significantly; patient having  financial issues obtaining medicines..   INTERVAL HISTORY:  I last saw [name] at the time of a flexible  sigmoidoscopy in March of 2007.  Since then he was admitted to the hospital  and found to be HIV positive.  He is on HIV medicines, although he does not  recall the names of these.  He was told that the rectal ulcer was likely a  complication of the HIV.  Since his hospitalization in late March 2007, he  has felt better and better every day.  He has zero rectal symptoms now.  He  is moving his bowels regularly and is having no bleeding, no pain.   CURRENT MEDICINES:  Three to four medicines for HIV.  Patient is unsure of  the name of any of these.   PHYSICAL EXAMINATION:  GENERAL:  Weight 192 pounds, up 29 pounds since his  last visit.  VITAL SIGNS:  Blood pressure 130/84, pulse 92.  CONSTITUTIONAL:  Generally appearing well.  ABDOMEN:  Soft, non-tender, non-distended.  RECTAL:  Deferred.   ASSESSMENT AND PLAN:  A 45 year old man with history of distal rectal ulcer.   When I first met [name] I asked about HIV status and he said he was tested  and was negative as of six months prior to his first visit, so I did not  think to recheck his HIV status.  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  There  were never any viral inclusions seen on biopsies, perhaps this was a direct  result of the HIV virus.  In either case, he is completely resolved from his  symptoms since starting HIV treatments.  I see no reason for any further  studies or tests at this point.                                   Rachael Fee, MD   DPJ/MedQ  DD:  05/27/2006  DT:  05/27/2006  Job #:  [id number]

## 2011-04-05 NOTE — H&P (Signed)
NAMETAIJON, VINK NO.:  [id number]   MEDICAL RECORD NO.:  [id number]          PATIENT TYPE:  INP   LOCATION:  3705                         FACILITY:  [facility]   PHYSICIAN:  Hollice Espy, M.D.DATE OF BIRTH:  1966-04-06   DATE OF ADMISSION:  02/11/2006  DATE OF DISCHARGE:                                HISTORY & PHYSICAL   PRIMARY CARE PHYSICIAN:  Dr. Elias Else   CONSULTS:  Dr. [facility]   CHIEF COMPLAINT:  Rectal bleeding.   HISTORY OF PRESENT ILLNESS:  Patient is a 45 year old African-American male  with possible reported history of HIV positive as well as a rectal ulcer in  the past that has been worked up.  Approximately three days ago he started  having episodes of rectal bleeding.  Initially said it was bright red, but  then became soon dark after.  It has continued for the last two days, but  actually has improved today, although he felt quite lightheaded.  He was  evaluated by his PCP today and found a hemoglobin 6.4.  His PCP contacted  [facility] hospitalists and patient was sent over to the emergency room for  further evaluation.  On arrival to the emergency room patient had  confirmatory laboratories checked.  He was found to have normal coags but  laboratories of note and concern were a hemoglobin of 6.4, hematocrit of  19.4 as well as on a differential.  Patient was found to have a normal white  count with shift but greater than 20% bandemia was noted.  In addition,  patient was initially tachycardic with a heart rate of 125, but  normotensive.  His blood pressure has remained stable and steady.  His  temperature initially was 100, but now is stable as well.  Currently patient  is doing well.  He never had any abdominal pain.  He denies any headaches,  vision changes, dysphagia, chest pain, palpitations, shortness of breath,  wheeze, cough, abdominal pain, hematuria, dysuria, constipation.  Said his  stools have been  loose with the bleeding, but otherwise well but whenever he  tries to sit up he feels very weak.  His review of systems otherwise  negative.   PAST MEDICAL HISTORY:  1.  Although the patient did not freely admit it in front of his family to      me, but reportedly is HIV positive as passed on.  2.  He also has a history of a rectal ulcer which has been in the process of      being worked up.   MEDICATIONS:  Colace.   ALLERGIES:  None.   SOCIAL HISTORY:  Patient denies tobacco, alcohol, or drug use.   FAMILY HISTORY:  Noncontributory.   PHYSICAL EXAMINATION:  VITAL SIGNS:  Temperature 100, heart rate 125  initially, now down to 98, blood pressure 140/81, respirations 18, O2  saturation 100% on room air.  GENERAL:  Patient is alert and oriented x3, in no apparent distress.  HEENT:  Normocephalic, atraumatic.  His mucous membranes are moist.  He has  no carotid bruits.  HEART:  Regular rate and rhythm.  S1, S2.  LUNGS:  Clear to auscultation bilaterally.  ABDOMEN:  Soft, nontender, nondistended.  Positive bowel sounds.  EXTREMITIES:  No clubbing, cyanosis, edema.   LABORATORIES:  Sodium 140, potassium 3.4, chloride 108, bicarbonate 26, BUN  11, creatinine 0.8, glucose 96, calcium 8.5.  White count 5.1, H&H 6.4/19.4,  MCV 84, platelet count 522.  Differential notes greater than 20% bandemia,  however, 75% neutrophils.   ASSESSMENT/PLAN:  1.  Acute normocytic anemia.  Given patient's initial tachycardia, but      normal BUN and creatinine, I feel that likely his rectal bleeding caused      his normocytic anemia but he appears to currently be stable.  Agree with      transfusing 2 units which has already been started here in the emergency      room and will check an H&H one hour post transfusion.  Will discuss with      Dr. Christella Hartigan of [facility] and likely patient will need an endoscopy for      possible upper GI bleed.  2.  Noted greater than 20% bandemia.  Patient may be showing  signs of an      acute infection, although currently he is stable, although note low-      grade temperature.  Continue to monitor for now.  In the meantime will      make the patient n.p.o., but him on intravenous Protonix and continue to      follow.  3.  Reported 042 diagnosis.  Will confirm with the primary care physician.      Hollice Espy, M.D.  Electronically Signed     SKK/MEDQ  D:  02/12/2006  T:  02/12/2006  Job:  [id number]   cc:   Molly Maduro A. Nicholos Johns, M.D.  Fax: [phone]

## 2011-04-05 NOTE — Discharge Summary (Signed)
[name], [name] NO.:  [id number]   MEDICAL RECORD NO.:  [id number]          PATIENT TYPE:  INP   LOCATION:  3702                         FACILITY:  [facility]   PHYSICIAN:  Hollice Espy, M.D.DATE OF BIRTH:  [birth year]   DATE OF ADMISSION:  02/11/2006  DATE OF DISCHARGE:  02/17/2006                                 DISCHARGE SUMMARY   ADDENDUM:   CONSULTATIONS:  Lacretia Leigh. Ninetta Lights, M.D., infectious disease.   The patient will continue on his same previous medications.  I have  discussed this case with the case worker here.  The plan will be for the  patient to follow up with infectious disease clinic ideally in the next  three or four days.  [facility] is able to give the patient three more days  of medications here.  It looks with most likely, given that he may not  qualify for disability, will not likely receive Medicaid.  He will likely  need free samples but in terms of work wise does not have a focal disability  at this time.  In the meantime, he will follow up in terms of his HIV with  infectious disease clinic and at that time can continue and hopefully they  will be able to obtain him medications of Zithromax, Bactrim, and Valtrex.      Hollice Espy, M.D.  Electronically Signed     SKK/MEDQ  D:  02/17/2006  T:  02/18/2006  Job:  [id number]   cc:   Lacretia Leigh. Ninetta Lights, M.D.  Fax: [phone]   Elana Alm. Nicholos Johns, M.D.  Fax: [phone]

## 2011-04-05 NOTE — Discharge Summary (Signed)
NAMEORIE, BAXENDALE NO.:  [id number]   MEDICAL RECORD NO.:  [id number]          PATIENT TYPE:  INP   LOCATION:  3702                         FACILITY:  [facility]   PHYSICIAN:  Sherin Quarry, MD      DATE OF BIRTH:  [birth year]   DATE OF ADMISSION:  02/11/2006  DATE OF DISCHARGE:                                 DISCHARGE SUMMARY   INTERIM DISCHARGE SUMMARY:  [name] is a 45 year old man who was  found to be HIV-positive on the day prior to admission, i.e. March 26.  Mr.  Meyers has a long-standing history of a rectal ulcer which was first  diagnosed in November of 2006.  This ulcer has been biopsied on several  occasions but the etiology has remained uncertain.  On March 24 the patient  had begun having rectal bleeding and he was evaluated in Dr. Benjaman Meyers office  and found to have a hemoglobin of 6.4.  The patient was sent to the  emergency room for the purpose of admission.  On presentation he was noted  to be somewhat tachycardiac, but was normotensive.  He had no complaints of  nausea, vomiting, abdominal pain, fevers, chills, or dysuria.   PHYSICAL EXAMINATION:  (At the time of admission as described by Dr. Virginia Meyers.)  VITAL SIGNS:  Temperature was 100, heart rate 98, blood pressure 140/81,  respirations 18, O2 saturation 100%.  HEENT:  Within normal limits.  CHEST:  Clear.  CARDIOVASCULAR:  Tachycardia.  Otherwise, there was normal S1 and S2 without  rubs or gallops.  ABDOMEN:  Benign.  Normal bowel sounds.  No masses or tenderness.  No  guarding or rebound.  NEUROLOGIC:  Normal.  EXTREMITIES:  Normal.   Relevant laboratory studies obtained at the time of admission included a  sodium of 140, potassium 3.4, creatinine 0.8, BUN 11, glucose 96.  INR was  1.1.  As mentioned, the hemoglobin was 6.4 with normochromic, normocytic  indices.  White count was 5100.  The patient was made n.p.o. at the time of  admission.  He was given Protonix 40 mg IV  daily empirically.  He was  transfused a total 4 units of packed red blood cells.  Consultation was  obtained with [facility] and on March 28 the patient underwent  a flexible sigmoidoscopy which showed a giant 5 cm ulcer with prominent  indurated surrounding mucosa beginning just inside the anal verge.  Once  again this ulcer was biopsied.  There is at that present no printed report  as to the outcome of this biopsy.  Canasa suppositories were recommended one  to the rectum twice daily and lidocaine cream was applied to the rectal  area.  The patient's diet was gradually advanced to a low-residue regular  diet and he was placed on MiraLax one dose daily.  Niferex 150 mg b.i.d. was  started for iron supplementation and Protonix was discontinued.  Subsequently, the patient's laboratory studies returned which showed that  his CD-4 count was 30 and that the HIV viral load was greater than 100,000.  Other relevant studies included a  negative RPR, cytomegalovirus antibody  that was positive, a GC/Chlamydia probe which was negative, and blood  cultures which are negative to date.  On March 29 the patient was seen in  consultation by Dr. Ninetta Lights of the infectious disease service who suggested  empiric therapy with Valtrex 1 g b.i.d. for possible herpes infection.  He  recommended that blood cultures be held for possible MAI infection.  Subsequently the patient's rectal bleeding appeared to resolve.  His  hemoglobin steadily increased such that by April 1 it was up to 10.9.  As of  April 1 the patient's medications consisted of Colace 100 mg b.i.d., Rowasa  suppositories one b.i.d., Niferex 150 mg b.i.d., MiraLax one dose daily,  Valtrex 1 g b.i.d.  Also, Dr. Ninetta Lights placed the patient on trimethoprim  sulfa one tablet every Monday, Wednesday, and Friday double-strength and  Zithromax two 600 mg tablets weekly as prophylaxis against opportunistic  infection.  The patient was also  receiving Tylox p.r.n. for pain.   ISSUES THAT NEED TO BE ADDRESSED:  1.  Patient's lack of financial resources which will make it impossible for      him to obtain these medications.  2.  Need for ongoing follow-up in regard to his HIV infection.   DIAGNOSES:  (As of April 1)  1.  HIV infection with CD-4 count of 30 and very high viral load.  2.  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  3.  Rectal bleeding secondary to rectal ulcer.  4.  Anemia secondary to rectal bleeding, resolved.           ______________________________  Sherin Quarry, MD     SY/MEDQ  D:  02/16/2006  T:  02/17/2006  Job:  [id number]   cc:   Molly Maduro A. Nicholos Johns, M.D.  Fax: [phone]   Rachael Fee, M.D.   Lacretia Leigh. Ninetta Lights, M.D.  Fax: [phone]

## 2011-04-08 ENCOUNTER — Ambulatory Visit: Payer: Self-pay | Admitting: Infectious Diseases

## 2011-04-22 ENCOUNTER — Other Ambulatory Visit: Payer: Self-pay | Admitting: Licensed Clinical Social Worker

## 2011-04-22 DIAGNOSIS — H3090 Unspecified chorioretinal inflammation, unspecified eye: Secondary | ICD-10-CM

## 2011-04-22 MED ORDER — PREDNISONE 5 MG PO TABS: 5.0000 mg | ORAL_TABLET | Freq: Every day | ORAL | Status: DC

## 2011-04-25 ENCOUNTER — Other Ambulatory Visit: Payer: Self-pay | Admitting: *Deleted

## 2011-04-25 DIAGNOSIS — H3090 Unspecified chorioretinal inflammation, unspecified eye: Secondary | ICD-10-CM

## 2011-04-25 MED ORDER — PREDNISONE 5 MG PO TABS: ORAL_TABLET | ORAL | Status: DC

## 2011-05-03 ENCOUNTER — Other Ambulatory Visit: Payer: Self-pay | Admitting: *Deleted

## 2011-05-03 DIAGNOSIS — B009 Herpesviral infection, unspecified: Secondary | ICD-10-CM

## 2011-05-03 DIAGNOSIS — I10 Essential (primary) hypertension: Secondary | ICD-10-CM

## 2011-05-03 DIAGNOSIS — B20 Human immunodeficiency virus [HIV] disease: Secondary | ICD-10-CM

## 2011-05-03 MED ORDER — RITONAVIR 100 MG PO TABS: 100.0000 mg | ORAL_TABLET | Freq: Every day | ORAL | Status: DC

## 2011-05-03 MED ORDER — LISINOPRIL 20 MG PO TABS: 20.0000 mg | ORAL_TABLET | Freq: Every day | ORAL | Status: DC

## 2011-05-03 MED ORDER — EMTRICITABINE-TENOFOVIR DF 200-300 MG PO TABS: 1.0000 | ORAL_TABLET | Freq: Every day | ORAL | Status: DC

## 2011-05-03 MED ORDER — VALACYCLOVIR HCL 1 G PO TABS: 1000.0000 mg | ORAL_TABLET | Freq: Two times a day (BID) | ORAL | Status: DC

## 2011-05-03 MED ORDER — ATAZANAVIR SULFATE 300 MG PO CAPS: 300.0000 mg | ORAL_CAPSULE | Freq: Every day | ORAL | Status: DC

## 2011-05-03 MED ORDER — RALTEGRAVIR POTASSIUM 400 MG PO TABS: 400.0000 mg | ORAL_TABLET | Freq: Two times a day (BID) | ORAL | Status: DC

## 2011-05-27 ENCOUNTER — Other Ambulatory Visit: Payer: Self-pay | Admitting: Infectious Diseases

## 2011-05-27 ENCOUNTER — Encounter: Payer: Self-pay | Admitting: Infectious Diseases

## 2011-05-27 ENCOUNTER — Ambulatory Visit: Payer: Self-pay

## 2011-05-27 ENCOUNTER — Ambulatory Visit (INDEPENDENT_AMBULATORY_CARE_PROVIDER_SITE_OTHER): Payer: Self-pay | Admitting: Infectious Diseases

## 2011-05-27 ENCOUNTER — Other Ambulatory Visit (HOSPITAL_COMMUNITY)
Admission: RE | Admit: 2011-05-27 | Discharge: 2011-05-27 | Disposition: A | Discharge status: Home / Self Care | Payer: Self-pay | Source: Ambulatory Visit | Attending: Infectious Diseases | Admitting: Infectious Diseases

## 2011-05-27 VITALS — BP 170/82 | HR 96 | Temp 98.6°F | Ht 72.0 in | Wt 252.0 lb

## 2011-05-27 DIAGNOSIS — L518 Other erythema multiforme: Secondary | ICD-10-CM

## 2011-05-27 DIAGNOSIS — H3090 Unspecified chorioretinal inflammation, unspecified eye: Secondary | ICD-10-CM | POA: Insufficient documentation

## 2011-05-27 DIAGNOSIS — A630 Anogenital (venereal) warts: Secondary | ICD-10-CM

## 2011-05-27 DIAGNOSIS — L519 Erythema multiforme, unspecified: Secondary | ICD-10-CM

## 2011-05-27 DIAGNOSIS — I10 Essential (primary) hypertension: Secondary | ICD-10-CM

## 2011-05-27 DIAGNOSIS — B20 Human immunodeficiency virus [HIV] disease: Secondary | ICD-10-CM

## 2011-05-27 DIAGNOSIS — Z23 Encounter for immunization: Secondary | ICD-10-CM

## 2011-05-27 NOTE — Progress Notes (Signed)
Addended by: Mariea Clonts D on: 05/27/2011 11:29 AM   Modules accepted: Orders

## 2011-05-27 NOTE — Progress Notes (Signed)
Addended by: Orvile Corona C on: 05/27/2011 10:05 AM   Modules accepted: Orders

## 2011-05-27 NOTE — Assessment & Plan Note (Signed)
Encourage him to take his medications. May need 2nd agent.

## 2011-05-27 NOTE — Progress Notes (Signed)
Addended by: Jennet Maduro D on: 05/27/2011 12:55 PM   Modules accepted: Orders

## 2011-05-27 NOTE — Progress Notes (Signed)
  Subjective:    Patient ID: [name], male    DOB: September 12, 1966, 45 y.o.   MRN: [id number]  HPI    Review of Systems     Objective:   Physical Exam  Eyes:       His R pupil is dilated, reacts poorly. Eomi.           Assessment & Plan:

## 2011-05-27 NOTE — Progress Notes (Signed)
  Subjective:    Patient ID: [name], male    DOB: Nov 27, 1965, 45 y.o.   MRN: [id number]  HPI 45 yo M HIV+,  anal lesion in 2007 path- squamous atypia. Prev hospitalized 2-4 to 2-7-1 with GI bleed.He had a GI eval (rectal bx condyloma, no malignancy 3-10)  Was in hospital 9-19- to 08-14-09 with suspected PCP (DFA negative).  Was seen 06-25-10 with peri-rectal cellulitis and very large rectal condyloma. Was referred to surgery.  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 He was started on Valcyte, prednisone eye drops and orally.  Last CD4 350 and VL 8560 (03-30-11). ATVr/TRV/ISN.  PNVX today. Todat states he has been doing well. "getting fat", has had to buy new pants.     Review of Systems  Constitutional: Negative for unexpected weight change.  Respiratory: Negative for chest tightness and shortness of breath.   Gastrointestinal: Negative for diarrhea, constipation and blood in stool.  Neurological: Negative for headaches.       Objective:   Physical Exam  Constitutional: He appears well-developed and well-nourished.  Eyes: EOM are normal. Pupils are equal, round, and reactive to light.  Neck: Normal range of motion. Neck supple.  Cardiovascular: Normal rate, regular rhythm and normal heart sounds.   Pulmonary/Chest: Effort normal and breath sounds normal. No respiratory distress.  Abdominal: Soft. Bowel sounds are normal. He exhibits no distension.    Lymphadenopathy:    He has no cervical adenopathy.          Assessment & Plan:

## 2011-05-27 NOTE — Assessment & Plan Note (Signed)
Will try to have him eval by surgery.

## 2011-05-27 NOTE — Assessment & Plan Note (Signed)
He  Has a detectable VL at last blood draw. Will send him for repeat labs. He swears adherence. Offered condoms. rtc 6 weeks.

## 2011-05-27 NOTE — Assessment & Plan Note (Signed)
This exact dx is unclear. Will get records. He will see his ophtho this week.

## 2011-05-28 LAB — HIV-1 RNA ULTRAQUANT REFLEX TO GENTYP+: HIV-1 RNA Quant, Log: 3.58 {Log} — ABNORMAL HIGH (ref <1.30)

## 2011-05-30 LAB — HIV-1 GENOTYPR PLUS

## 2011-06-05 ENCOUNTER — Ambulatory Visit: Payer: Self-pay

## 2011-06-10 ENCOUNTER — Encounter (INDEPENDENT_AMBULATORY_CARE_PROVIDER_SITE_OTHER): Payer: Self-pay | Admitting: Surgery

## 2011-06-12 ENCOUNTER — Ambulatory Visit (INDEPENDENT_AMBULATORY_CARE_PROVIDER_SITE_OTHER): Payer: Self-pay | Admitting: Surgery

## 2011-06-29 ENCOUNTER — Other Ambulatory Visit: Payer: Self-pay | Admitting: Infectious Diseases

## 2011-07-15 ENCOUNTER — Ambulatory Visit: Payer: Self-pay | Admitting: Infectious Diseases

## 2011-08-02 ENCOUNTER — Other Ambulatory Visit: Payer: Self-pay | Admitting: Infectious Diseases

## 2011-08-08 ENCOUNTER — Other Ambulatory Visit: Payer: Self-pay | Admitting: *Deleted

## 2011-08-08 DIAGNOSIS — I10 Essential (primary) hypertension: Secondary | ICD-10-CM

## 2011-08-08 MED ORDER — LISINOPRIL 20 MG PO TABS: 20.0000 mg | ORAL_TABLET | Freq: Every day | ORAL | Status: DC

## 2011-08-13 LAB — T-HELPER CELL (CD4) - (RCID CLINIC ONLY): CD4 % Helper T Cell: 8 — ABNORMAL LOW

## 2011-08-19 ENCOUNTER — Ambulatory Visit: Payer: Self-pay | Admitting: Infectious Diseases

## 2011-08-19 LAB — T-HELPER CELL (CD4) - (RCID CLINIC ONLY)
CD4 % Helper T Cell: 6 — ABNORMAL LOW
CD4 T Cell Abs: 50 — ABNORMAL LOW

## 2011-09-07 ENCOUNTER — Other Ambulatory Visit: Payer: Self-pay | Admitting: Infectious Diseases

## 2011-09-07 DIAGNOSIS — B009 Herpesviral infection, unspecified: Secondary | ICD-10-CM

## 2011-09-07 DIAGNOSIS — B20 Human immunodeficiency virus [HIV] disease: Secondary | ICD-10-CM

## 2011-09-10 ENCOUNTER — Encounter: Payer: Self-pay | Admitting: Infectious Diseases

## 2011-09-10 ENCOUNTER — Ambulatory Visit (INDEPENDENT_AMBULATORY_CARE_PROVIDER_SITE_OTHER): Payer: Self-pay | Admitting: Infectious Diseases

## 2011-09-10 VITALS — BP 168/85 | HR 83 | Temp 98.8°F | Ht 72.0 in | Wt 250.0 lb

## 2011-09-10 DIAGNOSIS — Z23 Encounter for immunization: Secondary | ICD-10-CM

## 2011-09-10 DIAGNOSIS — L519 Erythema multiforme, unspecified: Secondary | ICD-10-CM

## 2011-09-10 DIAGNOSIS — L518 Other erythema multiforme: Secondary | ICD-10-CM

## 2011-09-10 DIAGNOSIS — I10 Essential (primary) hypertension: Secondary | ICD-10-CM

## 2011-09-10 DIAGNOSIS — K626 Ulcer of anus and rectum: Secondary | ICD-10-CM

## 2011-09-10 DIAGNOSIS — B20 Human immunodeficiency virus [HIV] disease: Secondary | ICD-10-CM

## 2011-09-10 MED ORDER — LISINOPRIL 40 MG PO TABS: 40.0000 mg | ORAL_TABLET | Freq: Every day | ORAL | Status: DC

## 2011-09-10 NOTE — Progress Notes (Signed)
Addended by: Wendall Mola A on: 09/10/2011 03:51 PM   Modules accepted: Orders

## 2011-09-10 NOTE — Assessment & Plan Note (Signed)
He continues on valtrex, continued f/u with ophthalmology. My great appreciation to them for partnering with [place].

## 2011-09-10 NOTE — Assessment & Plan Note (Signed)
Will have him seen by Gen Surgery to f/u.

## 2011-09-10 NOTE — Assessment & Plan Note (Signed)
He appears to be doing well. Will recheck his labs today. Offered condoms. Gets flu shot. See back in 4-5 months

## 2011-09-10 NOTE — Assessment & Plan Note (Signed)
Will increase his lisinopril. He is asx but his BP is significantly elevated.

## 2011-09-10 NOTE — Progress Notes (Signed)
  Subjective:    Patient ID: [name], male    DOB: 01-30-1966, 45 y.o.   MRN: [id number]  HPI 45 yo M HIV+, anal lesion in 2007 path- squamous atypia. Prev hospitalized 2-4 to 2-7-1 with GI bleed.He had a GI eval (rectal bx condyloma, no malignancy 3-10)  Was in hospital 9-19- to 08-14-09 with suspected PCP (DFA negative).  Was seen 06-25-10 with peri-rectal cellulitis and very large rectal condyloma. Was referred to surgery.  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 He was started on Valcyte, prednisone eye drops and orally.  Last CD4 350 and VL 8560 (03-30-11). ATVr/TRV/ISN.  Has lost 2# since last visit.  Recently told he had cataracts. Had previous retinitis but has improved (can see but not as clear as L).  Review of Systems  Constitutional: Negative for appetite change and unexpected weight change.  Cardiovascular: Negative for chest pain.  Gastrointestinal: Negative for diarrhea and constipation.  Genitourinary: Negative for dysuria.  Neurological: Negative for headaches.       Objective:   Physical Exam  Constitutional: He appears well-developed and well-nourished.  Eyes: EOM are normal. Pupils are equal, round, and reactive to light.  Neck: Neck supple.  Cardiovascular: Normal rate, regular rhythm and normal heart sounds.   Pulmonary/Chest: Effort normal and breath sounds normal.  Abdominal: Soft. Bowel sounds are normal. There is no tenderness.  Musculoskeletal: He exhibits no edema.  Lymphadenopathy:    He has no cervical adenopathy.  Skin:       Multiple "bug bites" on ankles.           Assessment & Plan:

## 2011-10-22 ENCOUNTER — Ambulatory Visit (INDEPENDENT_AMBULATORY_CARE_PROVIDER_SITE_OTHER): Payer: Self-pay | Admitting: Licensed Clinical Social Worker

## 2011-10-22 ENCOUNTER — Other Ambulatory Visit (INDEPENDENT_AMBULATORY_CARE_PROVIDER_SITE_OTHER): Payer: Self-pay

## 2011-10-22 ENCOUNTER — Other Ambulatory Visit: Payer: Self-pay | Admitting: Infectious Diseases

## 2011-10-22 VITALS — BP 165/95 | HR 93

## 2011-10-22 DIAGNOSIS — B20 Human immunodeficiency virus [HIV] disease: Secondary | ICD-10-CM

## 2011-10-22 DIAGNOSIS — I10 Essential (primary) hypertension: Secondary | ICD-10-CM

## 2011-10-23 LAB — COMPREHENSIVE METABOLIC PANEL
ALT: 94 U/L — ABNORMAL HIGH (ref 0–53)
AST: 52 U/L — ABNORMAL HIGH (ref 0–37)
Albumin: 4.4 g/dL (ref 3.5–5.2)
Calcium: 9.2 mg/dL (ref 8.4–10.5)
Chloride: 107 mEq/L (ref 96–112)
Potassium: 3.7 mEq/L (ref 3.5–5.3)
Total Protein: 7.1 g/dL (ref 6.0–8.3)

## 2011-10-23 LAB — CBC WITH DIFFERENTIAL/PLATELET
Basophils Absolute: 0 10*3/uL (ref 0.0–0.1)
Basophils Relative: 0 % (ref 0–1)
MCHC: 35 g/dL (ref 30.0–36.0)
Monocytes Absolute: 0.5 10*3/uL (ref 0.1–1.0)
Neutro Abs: 1.6 10*3/uL — ABNORMAL LOW (ref 1.7–7.7)
Neutrophils Relative %: 40 % — ABNORMAL LOW (ref 43–77)
Platelets: 240 10*3/uL (ref 150–400)
RDW: 13 % (ref 11.5–15.5)

## 2011-10-23 LAB — T-HELPER CELL (CD4) - (RCID CLINIC ONLY)
CD4 % Helper T Cell: 14 % — ABNORMAL LOW (ref 33–55)
CD4 T Cell Abs: 290 uL — ABNORMAL LOW (ref 400–2700)

## 2011-10-24 LAB — HIV-1 RNA QUANT-NO REFLEX-BLD
HIV 1 RNA Quant: 2440 copies/mL — ABNORMAL HIGH (ref <20)
HIV-1 RNA Quant, Log: 3.39 {Log} — ABNORMAL HIGH (ref <1.30)

## 2011-11-06 ENCOUNTER — Ambulatory Visit (INDEPENDENT_AMBULATORY_CARE_PROVIDER_SITE_OTHER): Payer: Self-pay | Admitting: Infectious Diseases

## 2011-11-06 ENCOUNTER — Encounter: Payer: Self-pay | Admitting: Infectious Diseases

## 2011-11-06 VITALS — BP 146/83 | HR 76 | Temp 98.4°F | Ht 72.0 in | Wt 244.5 lb

## 2011-11-06 DIAGNOSIS — B1710 Acute hepatitis C without hepatic coma: Secondary | ICD-10-CM

## 2011-11-06 DIAGNOSIS — K626 Ulcer of anus and rectum: Secondary | ICD-10-CM

## 2011-11-06 DIAGNOSIS — B20 Human immunodeficiency virus [HIV] disease: Secondary | ICD-10-CM

## 2011-11-06 NOTE — Assessment & Plan Note (Signed)
He is doing ok, not sure why he is still "detectable". He is only taking ISN qd, will take bid. Offered condoms, refused. Will see him back in 3-4 months.

## 2011-11-06 NOTE — Assessment & Plan Note (Signed)
With his hx of anal condyloma, will have him seen by surgery for anoscopy.

## 2011-11-06 NOTE — Progress Notes (Signed)
Addended by: HATCHER, JEFFREY C on: 11/06/2011 04:14 PM   Modules accepted: Orders

## 2011-11-06 NOTE — Assessment & Plan Note (Signed)
Hep A immune, LFTs slightly increased.

## 2011-11-06 NOTE — Progress Notes (Signed)
  Subjective:    Patient ID: [name], male    DOB: [birth year], 45 y.o.   MRN: [id number]  HPI 45 yo M HIV+, anal condyloma.    Was in hospital 9-19- to 08-14-09 with suspected PCP (DFA negative).  Was seen 06-25-10 with peri-rectal cellulitis and condyloma, no surgery and have since resolved. Has not f/u with surgery since.  Previously seen by ophtho: Has cataract in R eye, has f/u to eval to see if it needs to be removed. 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  Last CD4 290 and VL 2440 (10-22-11).  ATVr/TRV/ISN. Denies missed doses.     Review of Systems  Constitutional: Negative for appetite change and unexpected weight change.  Respiratory: Negative for chest tightness.   Gastrointestinal: Negative for diarrhea and constipation.  Genitourinary: Negative for dysuria.  Neurological: Negative for headaches.       Objective:   Physical Exam  Constitutional: He appears well-developed and well-nourished.  HENT:  Head: Normocephalic.  Mouth/Throat: No oropharyngeal exudate.  Eyes: EOM are normal. Pupils are equal, round, and reactive to light.  Neck: Neck supple.  Cardiovascular: Normal rate, regular rhythm and normal heart sounds.   Pulmonary/Chest: Effort normal and breath sounds normal.  Abdominal: Soft. Bowel sounds are normal. He exhibits no distension. There is no tenderness.  Lymphadenopathy:    He has no cervical adenopathy.          Assessment & Plan:

## 2011-11-06 NOTE — Progress Notes (Signed)
Addended by: Mariea Clonts D on: 11/06/2011 04:35 PM   Modules accepted: Orders

## 2011-11-07 ENCOUNTER — Other Ambulatory Visit: Payer: Self-pay | Admitting: *Deleted

## 2011-11-07 ENCOUNTER — Telehealth: Payer: Self-pay | Admitting: *Deleted

## 2011-11-07 DIAGNOSIS — B20 Human immunodeficiency virus [HIV] disease: Secondary | ICD-10-CM

## 2011-11-07 NOTE — Telephone Encounter (Signed)
I gave him the info about his appt at [facility]. 11/27/11 at 11am. Told him to bring his orange card & copay if any. Told her to look for the percentage that will be paid & gave example if 100%, he will not pay anything. I gave him their phone number & told him if he cannot go, to call a day in advance.

## 2011-11-27 ENCOUNTER — Ambulatory Visit (INDEPENDENT_AMBULATORY_CARE_PROVIDER_SITE_OTHER): Payer: PRIVATE HEALTH INSURANCE | Admitting: General Surgery

## 2011-11-27 ENCOUNTER — Encounter (INDEPENDENT_AMBULATORY_CARE_PROVIDER_SITE_OTHER): Payer: Self-pay | Admitting: General Surgery

## 2011-11-27 VITALS — BP 146/104 | HR 76 | Temp 98.5°F | Resp 18 | Ht 72.0 in | Wt 242.0 lb

## 2011-11-27 DIAGNOSIS — A630 Anogenital (venereal) warts: Secondary | ICD-10-CM

## 2011-11-27 NOTE — Progress Notes (Signed)
Subjective:     Patient ID: [name], male   DOB: [birth year], 46 y.o.   MRN: [id number]  HPI Patient presents for evaluation of possible perianal condylomata. He is followed by Dr. Ninetta Lights from infectious disease for his HIV treatment. He had a large anal condyloma in the recent past. The patient feels it has gone away or fallen off. He can feel no warts or growths at this time. He is having no anal pain.Dr. Ninetta Lights asked [place] to see him in consultation to see if he has any ongoing condylomata and evaluated with endoscopy.  Review of Systems  Constitutional: Negative for fever, chills and unexpected weight change.  HENT: Negative for hearing loss, congestion, sore throat, trouble swallowing and voice change.   Eyes: Negative for visual disturbance.  Respiratory: Negative for cough and wheezing.   Cardiovascular: Negative for chest pain, palpitations and leg swelling.  Gastrointestinal: Negative for nausea, vomiting, abdominal pain, diarrhea, constipation, blood in stool, abdominal distention, anal bleeding and rectal pain.  Genitourinary: Negative for hematuria and difficulty urinating.  Musculoskeletal: Negative for arthralgias.  Skin: Negative for rash and wound.  Neurological: Negative for seizures, syncope, weakness and headaches.  Hematological: Negative for adenopathy. Does not bruise/bleed easily.       HIV positive  Psychiatric/Behavioral: Negative for confusion.       Objective:   Physical Exam  Cardiovascular: Normal rate, regular rhythm and normal heart sounds.   Pulmonary/Chest: Effort normal and breath sounds normal. No respiratory distress. He has no wheezes.  Abdominal: Soft. He exhibits no distension. There is no tenderness.  External anal exam reveals a small amount of scar tissue but no significant anal condylomata, there is no evidence of infection, there are no external hemorrhoids. Digital rectal exam revealed no significant masses. Anoscopy was done revealing no  significant rectal condylomata.     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 I will be happy to see him back if he develops more condylomata

## 2012-01-01 ENCOUNTER — Ambulatory Visit: Payer: Self-pay

## 2012-01-11 ENCOUNTER — Other Ambulatory Visit: Payer: Self-pay | Admitting: Infectious Diseases

## 2012-01-11 DIAGNOSIS — B009 Herpesviral infection, unspecified: Secondary | ICD-10-CM

## 2012-01-11 DIAGNOSIS — B20 Human immunodeficiency virus [HIV] disease: Secondary | ICD-10-CM

## 2012-01-22 ENCOUNTER — Ambulatory Visit: Payer: Self-pay

## 2012-02-26 ENCOUNTER — Other Ambulatory Visit: Payer: Self-pay

## 2012-02-27 ENCOUNTER — Other Ambulatory Visit (INDEPENDENT_AMBULATORY_CARE_PROVIDER_SITE_OTHER): Payer: Self-pay

## 2012-02-27 DIAGNOSIS — Z79899 Other long term (current) drug therapy: Secondary | ICD-10-CM

## 2012-02-27 DIAGNOSIS — Z113 Encounter for screening for infections with a predominantly sexual mode of transmission: Secondary | ICD-10-CM

## 2012-02-27 DIAGNOSIS — B20 Human immunodeficiency virus [HIV] disease: Secondary | ICD-10-CM

## 2012-02-27 LAB — CBC
Hemoglobin: 14.8 g/dL (ref 13.0–17.0)
MCH: 34.4 pg — ABNORMAL HIGH (ref 26.0–34.0)
MCHC: 35.4 g/dL (ref 30.0–36.0)
MCV: 97.2 fL (ref 78.0–100.0)

## 2012-02-27 LAB — COMPLETE METABOLIC PANEL WITH GFR
ALT: 261 U/L — ABNORMAL HIGH (ref 0–53)
AST: 208 U/L — ABNORMAL HIGH (ref 0–37)
CO2: 22 mEq/L (ref 19–32)
Calcium: 8.9 mg/dL (ref 8.4–10.5)
Chloride: 108 mEq/L (ref 96–112)
GFR, Est African American: >89 mL/min
Sodium: 138 mEq/L (ref 135–145)
Total Bilirubin: 1 mg/dL (ref 0.3–1.2)
Total Protein: 6.6 g/dL (ref 6.0–8.3)

## 2012-02-27 LAB — LIPID PANEL
LDL Cholesterol: 44 mg/dL (ref 0–99)
VLDL: 21 mg/dL (ref 0–40)

## 2012-02-28 LAB — T-HELPER CELL (CD4) - (RCID CLINIC ONLY): CD4 T Cell Abs: 260 uL — ABNORMAL LOW (ref 400–2700)

## 2012-03-06 LAB — HIV-1 GENOTYPR PLUS

## 2012-03-11 ENCOUNTER — Ambulatory Visit (INDEPENDENT_AMBULATORY_CARE_PROVIDER_SITE_OTHER): Payer: Self-pay | Admitting: Infectious Diseases

## 2012-03-11 ENCOUNTER — Telehealth: Payer: Self-pay | Admitting: *Deleted

## 2012-03-11 ENCOUNTER — Encounter: Payer: Self-pay | Admitting: Infectious Diseases

## 2012-03-11 ENCOUNTER — Ambulatory Visit: Payer: Self-pay | Admitting: Infectious Diseases

## 2012-03-11 VITALS — BP 99/69 | HR 90 | Temp 98.5°F | Ht 72.0 in | Wt 249.0 lb

## 2012-03-11 DIAGNOSIS — B20 Human immunodeficiency virus [HIV] disease: Secondary | ICD-10-CM

## 2012-03-11 DIAGNOSIS — I10 Essential (primary) hypertension: Secondary | ICD-10-CM

## 2012-03-11 LAB — HIV-1 INTEGRASE GENOTYPE

## 2012-03-11 MED ORDER — LISINOPRIL 40 MG PO TABS: 20.0000 mg | ORAL_TABLET | Freq: Every day | ORAL | Status: DC

## 2012-03-11 NOTE — Progress Notes (Signed)
  Subjective:    Patient ID: [name], male    DOB: [birth year], 46 y.o.   MRN: [id number]  HPI 46 yo M HIV+, anal condyloma.  Was in hospital 9-19- to 08-14-09 with suspected PCP (DFA negative).  Was seen 06-25-10 with peri-rectal cellulitis and condyloma, no surgery and have since resolved. Had surgical f/u Jan 2013 and no conydloma noted.   Previously seen by ophtho: Has cataract in R eye, has f/u to eval to see if it needs to be removed. 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  ATVr/TRV/ISN. Had genotype 02-2012 and was naive.   HIV 1 RNA Quant (copies/mL)  Date Value  02/27/2012 6735*  10/22/2011 2440*  05/27/2011 3830*     CD4 T Cell Abs (cmm)  Date Value  02/27/2012 260*  10/22/2011 290*  05/27/2011 250*    Feels well except allergies. Runny nose, itchy/sandy eyes, sneezing, cough. Taking allegra with some relief.     Review of Systems  Constitutional: Negative for appetite change and unexpected weight change.  HENT: Positive for rhinorrhea, sneezing and postnasal drip.   Respiratory: Positive for cough.   Cardiovascular: Negative for chest pain.  Gastrointestinal: Negative for diarrhea and constipation.  Genitourinary: Negative for dysuria.  Neurological: Negative for headaches.       Objective:   Physical Exam  Constitutional: He appears well-developed and well-nourished.  HENT:  Mouth/Throat: No oropharyngeal exudate.  Eyes: EOM are normal. Pupils are equal, round, and reactive to light.  Neck: Neck supple.  Cardiovascular: Normal rate, regular rhythm and normal heart sounds.   Pulmonary/Chest: Effort normal and breath sounds normal. No respiratory distress. He has no wheezes. He has no rales.  Abdominal: Soft. Bowel sounds are normal. He exhibits no distension. There is no tenderness.  Lymphadenopathy:    He has no cervical adenopathy.          Assessment & Plan:

## 2012-03-11 NOTE — Assessment & Plan Note (Addendum)
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 Will check his trophile, [id number]. Could consider testing drug levels in blood at next visit if still detectable. He swears adherence. He is offered condoms, refuses. Will have him seen by dental. rtc 1 month1.

## 2012-03-11 NOTE — Telephone Encounter (Signed)
Per Dr. Ninetta Lights, patient's labs showed resistance to two of his medications.  He needs a lab appointment in one month for further labs.  The order is in the computer.  I had to leave the patient a voicemail to call and schedule a lab appointment. Wendall Mola CMA

## 2012-03-11 NOTE — Progress Notes (Signed)
Addended by: Giancarlos Berendt C on: 03/11/2012 01:48 PM   Modules accepted: Orders

## 2012-03-11 NOTE — Telephone Encounter (Signed)
I spoke with the pt & told him what was needed & why. Transferred to front to make the appt

## 2012-03-11 NOTE — Assessment & Plan Note (Signed)
He is asx but his BP is low today. 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 Will 1/2 his BP medication (have asked him to break rx in 1/2). If he can't, asked him to call and we will change his rx.

## 2012-04-08 ENCOUNTER — Other Ambulatory Visit: Payer: Self-pay | Admitting: Infectious Diseases

## 2012-04-08 DIAGNOSIS — B20 Human immunodeficiency virus [HIV] disease: Secondary | ICD-10-CM

## 2012-04-09 ENCOUNTER — Other Ambulatory Visit: Payer: Self-pay

## 2012-05-08 ENCOUNTER — Other Ambulatory Visit: Payer: Self-pay | Admitting: Infectious Diseases

## 2012-06-01 ENCOUNTER — Telehealth: Payer: Self-pay | Admitting: *Deleted

## 2012-06-01 ENCOUNTER — Other Ambulatory Visit: Payer: Self-pay | Admitting: *Deleted

## 2012-06-01 DIAGNOSIS — I10 Essential (primary) hypertension: Secondary | ICD-10-CM

## 2012-06-01 MED ORDER — LISINOPRIL 40 MG PO TABS: 20.0000 mg | ORAL_TABLET | Freq: Every day | ORAL | Status: DC

## 2012-06-01 NOTE — Telephone Encounter (Signed)
Patient called c/o oral abcess that has burst in his mouth.  He feels he may need an antibiotic.  Spoke with the dental clinic scheduler and his appt will try to be moved up, but they are not here this week.  Appt scheduled with Dr. Orvan Falconer for tomorrow. Wendall Mola CMA

## 2012-06-02 ENCOUNTER — Ambulatory Visit (INDEPENDENT_AMBULATORY_CARE_PROVIDER_SITE_OTHER): Payer: Self-pay | Admitting: Internal Medicine

## 2012-06-02 VITALS — BP 160/105 | HR 93 | Temp 98.5°F | Ht 72.0 in | Wt 249.0 lb

## 2012-06-02 DIAGNOSIS — K047 Periapical abscess without sinus: Secondary | ICD-10-CM

## 2012-06-02 MED ORDER — AMOXICILLIN-POT CLAVULANATE 875-125 MG PO TABS: 1.0000 | ORAL_TABLET | Freq: Two times a day (BID) | ORAL | Status: AC

## 2012-06-02 NOTE — Progress Notes (Signed)
Patient ID: [name], male   DOB: 27-Mar-1966, 46 y.o.   MRN: [id number]     [facility] for Infectious Disease  Patient Active Problem List  Diagnosis  . HIV DISEASE  . HEPATITIS C  . VENEREAL WART  . CANDIDIASIS, ORAL  . HYPOKALEMIA  . LESION OF PLANTAR NERVE  . RETINITIS  . HYPERTENSION  . ALLERGIES-SEASONAL  . RECTAL BLEEDING  . Anal condyloma  . POISON IVY DERMATITIS  . SYNCOPE  . ABNORMAL FINDINGS GI TRACT  . HX, PERSONAL, PAST NONCOMPLIANCE  . Herpes iris  . Dental abscess    Patient's Medications  New Prescriptions   AMOXICILLIN-CLAVULANATE (AUGMENTIN) 875-125 MG PER TABLET    Take 1 tablet by mouth 2 (two) times daily.  Previous Medications   ISENTRESS 400 MG TABLET    TAKE 1 TABLET BY MOUTH TWICE DAILY   LISINOPRIL (PRINIVIL,ZESTRIL) 40 MG TABLET    Take 0.5 tablets (20 mg total) by mouth daily.   NORVIR 100 MG TABS    TAKE 1 TABLET BY MOUTH DAILY   REYATAZ 300 MG CAPSULE    TAKE 1 CAPSULE BY MOUTH EVERY DAY   TRUVADA 200-300 MG PER TABLET    TAKE 1 TABLET BY MOUTH DAILY   VALACYCLOVIR (VALTREX) 1000 MG TABLET    TAKE 1 TABLET BY MOUTH TWICE DAILY  Modified Medications   No medications on file  Discontinued Medications   No medications on file    Subjective: Mr. [name] is seen on a work in basis. He started having some aching pain in his left lower jaw one week ago and yesterday he had the sudden drainage of pus from his lower gumline on the left. The aching pain has improved since that happened yesterday. He has not had any fever.  Objective: Temp: 98.5 F (36.9 C) (07/16 1127) Temp src: Oral (07/16 1127) BP: 160/105 mmHg (07/16 1127) Pulse Rate: 93  (07/16 1127)  General: He is in no distress Oral: He is visible swelling along the left lower jaw line that is nontender to palpation He appears to have 3 left mandibular teeth that are broken off at the jawline (I believe 18, 19 and 20) with swelling of the gumline adjacent to those teeth and some  ecchymoses. No pus is expressible and it is not particular tender Skin: No rash Lungs: Clear Cor: Regular S1 and S2 no murmur  Lab Results HIV 1 RNA Quant (copies/mL)  Date Value  02/27/2012 6735*  10/22/2011 2440*  05/27/2011 3830*     CD4 T Cell Abs (cmm)  Date Value  02/27/2012 260*  10/22/2011 290*  05/27/2011 250*     Assessment: He has a dental abscess associated with broken molars on his left mandible. I will start him on empiric Augmentin and obtain an orthopantogram. He is scheduled to be seen in our dental clinic in about 10 days.  Plan: 1. Augmentin 2. Orthopantogram 3. Continue current HIV medications 4. Followup in dental clinic on July 26 5. Followup with Dr. Ninetta Lights here are in September   Cliffton Asters, MD [facility] for Infectious Disease [facility] [phone] pager   [phone] cell 06/02/2012, 11:45 AM

## 2012-06-08 ENCOUNTER — Other Ambulatory Visit: Payer: Self-pay | Admitting: Infectious Diseases

## 2012-07-06 ENCOUNTER — Ambulatory Visit: Payer: Self-pay

## 2012-07-23 ENCOUNTER — Ambulatory Visit: Payer: Self-pay

## 2012-07-27 ENCOUNTER — Other Ambulatory Visit: Payer: Self-pay | Admitting: Infectious Diseases

## 2012-07-27 DIAGNOSIS — Z79899 Other long term (current) drug therapy: Secondary | ICD-10-CM

## 2012-07-27 DIAGNOSIS — Z113 Encounter for screening for infections with a predominantly sexual mode of transmission: Secondary | ICD-10-CM

## 2012-07-28 ENCOUNTER — Other Ambulatory Visit: Payer: Self-pay

## 2012-07-29 ENCOUNTER — Other Ambulatory Visit: Payer: Self-pay | Admitting: *Deleted

## 2012-07-29 ENCOUNTER — Other Ambulatory Visit (INDEPENDENT_AMBULATORY_CARE_PROVIDER_SITE_OTHER): Payer: Self-pay

## 2012-07-29 DIAGNOSIS — B20 Human immunodeficiency virus [HIV] disease: Secondary | ICD-10-CM

## 2012-07-30 LAB — CBC WITH DIFFERENTIAL/PLATELET
Hemoglobin: 15.3 g/dL (ref 13.0–17.0)
Lymphocytes Relative: 53 % — ABNORMAL HIGH (ref 12–46)
Lymphs Abs: 2.2 10*3/uL (ref 0.7–4.0)
MCH: 35.8 pg — ABNORMAL HIGH (ref 26.0–34.0)
Monocytes Relative: 11 % (ref 3–12)
Neutro Abs: 1.5 10*3/uL — ABNORMAL LOW (ref 1.7–7.7)
Neutrophils Relative %: 35 % — ABNORMAL LOW (ref 43–77)
RBC: 4.27 MIL/uL (ref 4.22–5.81)

## 2012-07-30 LAB — T-HELPER CELL (CD4) - (RCID CLINIC ONLY)
CD4 % Helper T Cell: 16 % — ABNORMAL LOW (ref 33–55)
CD4 T Cell Abs: 320 uL — ABNORMAL LOW (ref 400–2700)

## 2012-07-30 LAB — COMPREHENSIVE METABOLIC PANEL
Albumin: 3.8 g/dL (ref 3.5–5.2)
CO2: 25 mEq/L (ref 19–32)
Chloride: 107 mEq/L (ref 96–112)
Glucose, Bld: 119 mg/dL — ABNORMAL HIGH (ref 70–99)
Potassium: 3.8 mEq/L (ref 3.5–5.3)
Sodium: 139 mEq/L (ref 135–145)
Total Protein: 7.2 g/dL (ref 6.0–8.3)

## 2012-08-02 LAB — HIV-1 RNA ULTRAQUANT REFLEX TO GENTYP+: HIV 1 RNA Quant: 11070 copies/mL — ABNORMAL HIGH (ref <20)

## 2012-08-12 ENCOUNTER — Ambulatory Visit: Payer: Self-pay | Admitting: Infectious Diseases

## 2012-08-26 ENCOUNTER — Ambulatory Visit: Payer: Self-pay | Admitting: Infectious Diseases

## 2012-08-27 ENCOUNTER — Encounter: Payer: Self-pay | Admitting: Infectious Diseases

## 2012-08-27 ENCOUNTER — Ambulatory Visit (INDEPENDENT_AMBULATORY_CARE_PROVIDER_SITE_OTHER): Payer: Self-pay | Admitting: Infectious Diseases

## 2012-08-27 VITALS — BP 158/89 | HR 89 | Temp 98.2°F | Ht 72.0 in | Wt 237.0 lb

## 2012-08-27 DIAGNOSIS — K047 Periapical abscess without sinus: Secondary | ICD-10-CM

## 2012-08-27 DIAGNOSIS — Z113 Encounter for screening for infections with a predominantly sexual mode of transmission: Secondary | ICD-10-CM

## 2012-08-27 DIAGNOSIS — I10 Essential (primary) hypertension: Secondary | ICD-10-CM

## 2012-08-27 DIAGNOSIS — Z23 Encounter for immunization: Secondary | ICD-10-CM

## 2012-08-27 DIAGNOSIS — B1920 Unspecified viral hepatitis C without hepatic coma: Secondary | ICD-10-CM

## 2012-08-27 DIAGNOSIS — Z79899 Other long term (current) drug therapy: Secondary | ICD-10-CM

## 2012-08-27 DIAGNOSIS — A630 Anogenital (venereal) warts: Secondary | ICD-10-CM

## 2012-08-27 DIAGNOSIS — B20 Human immunodeficiency virus [HIV] disease: Secondary | ICD-10-CM

## 2012-08-27 NOTE — Progress Notes (Signed)
  Subjective:    Patient ID: [name], male    DOB: 04/30/1966, 46 y.o.   MRN: [id number]  HPI 46 yo M HIV+ Was in hospital 9-19- to 08-14-09 with suspected PCP (DFA negative).  Was seen 06-25-10 with peri-rectal cellulitis and condyloma, no surgery and have since resolved. Had surgical f/u Jan 2013 and no conydloma noted.  Previously seen by ophtho: Has cataract in R eye, has f/u to eval to see if it needs to be removed. 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  ATVr/TRV/ISN. Had genotype 02-2012 and was naive. Seen this summer for dental abscesses.  Worried about his mother- fell and broke her ankle. She recently survived pancreactic cancer.  He is getting overwhelmed with caring for her at home.  States he has missed days of his ART due to taking care of his mom.     Review of Systems  Constitutional: Negative for appetite change and unexpected weight change.  Respiratory: Negative for shortness of breath.   Cardiovascular: Negative for chest pain.  Gastrointestinal: Negative for diarrhea and constipation.  Genitourinary: Negative for dysuria.  Neurological: Negative for headaches.       Objective:   Physical Exam  Constitutional: He appears well-developed and well-nourished.  HENT:  Mouth/Throat: Abnormal dentition. Dental caries present. No oropharyngeal exudate.  Eyes: EOM are normal. Pupils are equal, round, and reactive to light.  Cardiovascular: Normal rate, regular rhythm and normal heart sounds.   Pulmonary/Chest: Effort normal and breath sounds normal.  Abdominal: Soft. Bowel sounds are normal. There is no tenderness. There is no rebound.          Assessment & Plan:

## 2012-08-27 NOTE — Addendum Note (Signed)
Addended by: Sharin Altidor C on: 08/27/2012 03:39 PM   Modules accepted: Orders

## 2012-08-27 NOTE — Assessment & Plan Note (Signed)
His LFTs have increased over last 6 months. Will get hepatic u/s. See him back in 3 months

## 2012-08-27 NOTE — Assessment & Plan Note (Signed)
He is doing well despite non-adherence. He knows he needs to do better. Will see him back in 3 months with repeat labs, genotype. Gets flu shot today. Offered/refused condoms.

## 2012-08-27 NOTE — Assessment & Plan Note (Signed)
Have asked him to check his BP while at home. When he previously increased his lisinopril to 40mg  daily, it made him hypotensive. Will see him back in 3 months.

## 2012-08-27 NOTE — Assessment & Plan Note (Signed)
Get him back into dental clinic

## 2012-08-27 NOTE — Assessment & Plan Note (Signed)
Try to get him anoscopy when available.

## 2012-09-01 ENCOUNTER — Ambulatory Visit (HOSPITAL_COMMUNITY): Admission: RE | Admit: 2012-09-01 | Payer: Self-pay | Source: Ambulatory Visit

## 2012-09-06 ENCOUNTER — Other Ambulatory Visit: Payer: Self-pay | Admitting: Internal Medicine

## 2012-10-10 ENCOUNTER — Other Ambulatory Visit: Payer: Self-pay | Admitting: Internal Medicine

## 2012-10-12 ENCOUNTER — Other Ambulatory Visit: Payer: Self-pay | Admitting: *Deleted

## 2012-11-14 ENCOUNTER — Other Ambulatory Visit: Payer: Self-pay | Admitting: Infectious Diseases

## 2012-12-13 ENCOUNTER — Other Ambulatory Visit: Payer: Self-pay | Admitting: Infectious Diseases

## 2012-12-14 ENCOUNTER — Other Ambulatory Visit: Payer: Self-pay | Admitting: Licensed Clinical Social Worker

## 2012-12-14 ENCOUNTER — Other Ambulatory Visit: Payer: Self-pay | Admitting: *Deleted

## 2012-12-14 ENCOUNTER — Other Ambulatory Visit: Payer: Self-pay

## 2012-12-14 DIAGNOSIS — B20 Human immunodeficiency virus [HIV] disease: Secondary | ICD-10-CM

## 2012-12-14 MED ORDER — ATAZANAVIR SULFATE 300 MG PO CAPS: 300.0000 mg | ORAL_CAPSULE | Freq: Every day | ORAL | Status: DC

## 2012-12-14 MED ORDER — EMTRICITABINE-TENOFOVIR DF 200-300 MG PO TABS: 1.0000 | ORAL_TABLET | Freq: Every day | ORAL | Status: DC

## 2012-12-14 MED ORDER — RITONAVIR 100 MG PO TABS: 100.0000 mg | ORAL_TABLET | Freq: Every day | ORAL | Status: DC

## 2012-12-14 MED ORDER — VALACYCLOVIR HCL 1 G PO TABS: 1000.0000 mg | ORAL_TABLET | Freq: Two times a day (BID) | ORAL | Status: DC

## 2012-12-14 MED ORDER — RALTEGRAVIR POTASSIUM 400 MG PO TABS: 400.0000 mg | ORAL_TABLET | Freq: Two times a day (BID) | ORAL | Status: DC

## 2012-12-17 ENCOUNTER — Other Ambulatory Visit (INDEPENDENT_AMBULATORY_CARE_PROVIDER_SITE_OTHER): Payer: Self-pay

## 2012-12-17 DIAGNOSIS — Z79899 Other long term (current) drug therapy: Secondary | ICD-10-CM

## 2012-12-17 DIAGNOSIS — Z113 Encounter for screening for infections with a predominantly sexual mode of transmission: Secondary | ICD-10-CM

## 2012-12-17 DIAGNOSIS — B1920 Unspecified viral hepatitis C without hepatic coma: Secondary | ICD-10-CM

## 2012-12-17 DIAGNOSIS — B20 Human immunodeficiency virus [HIV] disease: Secondary | ICD-10-CM

## 2012-12-17 LAB — LIPID PANEL
Total CHOL/HDL Ratio: 4.8 Ratio
VLDL: 49 mg/dL — ABNORMAL HIGH (ref 0–40)

## 2012-12-17 LAB — CBC
HCT: 41 % (ref 39.0–52.0)
MCHC: 35.4 g/dL (ref 30.0–36.0)
MCV: 101.7 fL — ABNORMAL HIGH (ref 78.0–100.0)
RDW: 13.2 % (ref 11.5–15.5)
WBC: 3.3 10*3/uL — ABNORMAL LOW (ref 4.0–10.5)

## 2012-12-18 LAB — COMPLETE METABOLIC PANEL WITH GFR
ALT: 175 U/L — ABNORMAL HIGH (ref 0–53)
AST: 130 U/L — ABNORMAL HIGH (ref 0–37)
Albumin: 3.8 g/dL (ref 3.5–5.2)
Alkaline Phosphatase: 105 U/L (ref 39–117)
Potassium: 3.5 mEq/L (ref 3.5–5.3)
Sodium: 139 mEq/L (ref 135–145)
Total Protein: 6.8 g/dL (ref 6.0–8.3)

## 2012-12-18 LAB — HIV-1 RNA ULTRAQUANT REFLEX TO GENTYP+
HIV 1 RNA Quant: 9922 copies/mL — ABNORMAL HIGH (ref <20)
HIV-1 RNA Quant, Log: 4 {Log} — ABNORMAL HIGH (ref <1.30)

## 2012-12-18 LAB — T-HELPER CELL (CD4) - (RCID CLINIC ONLY)
CD4 % Helper T Cell: 13 % — ABNORMAL LOW (ref 33–55)
CD4 T Cell Abs: 250 uL — ABNORMAL LOW (ref 400–2700)

## 2012-12-22 ENCOUNTER — Encounter: Payer: Self-pay | Admitting: Infectious Diseases

## 2012-12-28 ENCOUNTER — Ambulatory Visit (INDEPENDENT_AMBULATORY_CARE_PROVIDER_SITE_OTHER): Payer: Self-pay | Admitting: Infectious Diseases

## 2012-12-28 ENCOUNTER — Encounter: Payer: Self-pay | Admitting: Infectious Diseases

## 2012-12-28 ENCOUNTER — Ambulatory Visit: Payer: Self-pay

## 2012-12-28 VITALS — BP 167/101 | HR 80 | Temp 98.4°F | Ht 72.0 in | Wt 242.0 lb

## 2012-12-28 DIAGNOSIS — K047 Periapical abscess without sinus: Secondary | ICD-10-CM

## 2012-12-28 DIAGNOSIS — B20 Human immunodeficiency virus [HIV] disease: Secondary | ICD-10-CM

## 2012-12-28 DIAGNOSIS — I10 Essential (primary) hypertension: Secondary | ICD-10-CM

## 2012-12-28 DIAGNOSIS — A630 Anogenital (venereal) warts: Secondary | ICD-10-CM

## 2012-12-28 LAB — HIV-1 GENOTYPR PLUS

## 2012-12-28 MED ORDER — DARUNAVIR ETHANOLATE 800 MG PO TABS: 800.0000 mg | ORAL_TABLET | Freq: Every day | ORAL | Status: DC

## 2012-12-28 MED ORDER — LISINOPRIL 40 MG PO TABS: 40.0000 mg | ORAL_TABLET | Freq: Every day | ORAL | Status: DC

## 2012-12-28 MED ORDER — RITONAVIR 100 MG PO TABS: 100.0000 mg | ORAL_TABLET | Freq: Every day | ORAL | Status: DC

## 2012-12-28 MED ORDER — LAMIVUDINE-ZIDOVUDINE 150-300 MG PO TABS: 1.0000 | ORAL_TABLET | Freq: Two times a day (BID) | ORAL | Status: DC

## 2012-12-28 MED ORDER — TENOFOVIR DISOPROXIL FUMARATE 300 MG PO TABS: 300.0000 mg | ORAL_TABLET | Freq: Every day | ORAL | Status: DC

## 2012-12-28 NOTE — Assessment & Plan Note (Addendum)
Will refer him for HRA, has not previously had by his recollection, has had prev eval Jan 2013.

## 2012-12-28 NOTE — Assessment & Plan Note (Signed)
Will increase his ACE-I to 40mg  qday. See him back in 1 month

## 2012-12-28 NOTE — Assessment & Plan Note (Signed)
Will change his meds (CBV/TFV/DRVr). He needs better med adherence as well. Offered/refused condoms. Flu up todate.  Will see him back in 1 month.

## 2012-12-28 NOTE — Progress Notes (Signed)
  Subjective:    Patient ID: [name], male    DOB: 20-May-1966, 47 y.o.   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  Was seen 06-25-10 with peri-rectal cellulitis and condyloma, no surgery and have since resolved. Had surgical f/u Jan 2013 and no conydloma noted.  Previously seen by ophtho: Has cataract in R eye, has f/u to eval to see if it needs to be removed. 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  ATVr/TRV/ISN. Had genotype 02-2012 and was naive. Has had problems with HTN- was around friends who were smoking marijuana and it became very high, tingling in his arms. Was eval by paramedics.  Has missed some doses of ART. States he has not been getting his TRV. Had genotype with lasts labs (naive). Previous genotype showed INI-R. Genotype 2011 showed: K103N,M184V,G190A,L210W    HIV 1 RNA Quant (copies/mL)  Date Value  12/17/2012 9922*  07/29/2012 11070*  02/27/2012 6735*     CD4 T Cell Abs (cmm)  Date Value  12/17/2012 250*  07/29/2012 320*  02/27/2012 260*      Review of Systems  Constitutional: Negative for fever, chills, appetite change and unexpected weight change.  Respiratory: Negative for cough and shortness of breath.   Cardiovascular: Negative for chest pain and leg swelling.  Gastrointestinal: Negative for diarrhea and constipation.  Genitourinary: Negative for dysuria.  Neurological: Negative for headaches.       Objective:   Physical Exam  Constitutional: He appears well-developed and well-nourished.  HENT:  Mouth/Throat: No oropharyngeal exudate.    Eyes: EOM are normal. Pupils are equal, round, and reactive to light.  Cardiovascular: Normal rate, regular rhythm and normal heart sounds.   Pulmonary/Chest: Effort normal and breath sounds normal. No respiratory distress.  Abdominal: Soft. Bowel sounds are normal. He exhibits no distension. There is no tenderness.  Musculoskeletal: He  exhibits no edema.  Lymphadenopathy:    He has no cervical adenopathy.          Assessment & Plan:

## 2012-12-28 NOTE — Assessment & Plan Note (Signed)
Sign him up for dental eval.

## 2012-12-29 ENCOUNTER — Telehealth: Payer: Self-pay | Admitting: *Deleted

## 2012-12-29 NOTE — Telephone Encounter (Signed)
Notified patient of referral to Dr. Romie Levee for a HRA.  Appointment made for 01/14/13 at 1:30 with patient to arrive at 1:00.  Patient given phone number [phone] if he needs to reschedule.  Patient verbalized understanding of plan. Andree Coss, RN

## 2013-01-14 ENCOUNTER — Ambulatory Visit (INDEPENDENT_AMBULATORY_CARE_PROVIDER_SITE_OTHER): Payer: Self-pay | Admitting: General Surgery

## 2013-01-20 ENCOUNTER — Encounter (INDEPENDENT_AMBULATORY_CARE_PROVIDER_SITE_OTHER): Payer: Self-pay | Admitting: General Surgery

## 2013-01-27 ENCOUNTER — Telehealth: Payer: Self-pay | Admitting: *Deleted

## 2013-01-27 ENCOUNTER — Ambulatory Visit: Payer: Self-pay | Admitting: Infectious Diseases

## 2013-01-27 NOTE — Telephone Encounter (Signed)
Multiple attempts made to call patient regarding his missed appt today, it seemed that the phone was off the hook. Quick busy signal [name]

## 2013-04-19 ENCOUNTER — Ambulatory Visit: Payer: Self-pay | Admitting: Infectious Diseases

## 2013-05-31 ENCOUNTER — Other Ambulatory Visit: Payer: Self-pay | Admitting: *Deleted

## 2013-05-31 DIAGNOSIS — B20 Human immunodeficiency virus [HIV] disease: Secondary | ICD-10-CM

## 2013-05-31 MED ORDER — VALACYCLOVIR HCL 1 G PO TABS: 1000.0000 mg | ORAL_TABLET | Freq: Two times a day (BID) | ORAL | Status: DC

## 2013-07-02 ENCOUNTER — Ambulatory Visit: Payer: Self-pay | Admitting: Infectious Diseases

## 2013-07-28 ENCOUNTER — Ambulatory Visit: Payer: Self-pay

## 2013-08-05 ENCOUNTER — Ambulatory Visit: Payer: Self-pay

## 2013-08-17 ENCOUNTER — Ambulatory Visit: Payer: Self-pay

## 2013-09-08 ENCOUNTER — Other Ambulatory Visit: Payer: Self-pay | Admitting: Licensed Clinical Social Worker

## 2013-09-08 DIAGNOSIS — B20 Human immunodeficiency virus [HIV] disease: Secondary | ICD-10-CM

## 2013-09-08 MED ORDER — VALACYCLOVIR HCL 1 G PO TABS: 1000.0000 mg | ORAL_TABLET | Freq: Two times a day (BID) | ORAL | Status: DC

## 2013-10-06 ENCOUNTER — Encounter: Payer: Self-pay | Admitting: Infectious Diseases

## 2013-10-06 ENCOUNTER — Ambulatory Visit (INDEPENDENT_AMBULATORY_CARE_PROVIDER_SITE_OTHER): Payer: Self-pay | Admitting: Infectious Diseases

## 2013-10-06 VITALS — BP 160/92 | HR 84 | Temp 98.4°F | Ht 72.0 in | Wt 242.0 lb

## 2013-10-06 DIAGNOSIS — Z113 Encounter for screening for infections with a predominantly sexual mode of transmission: Secondary | ICD-10-CM

## 2013-10-06 DIAGNOSIS — Z23 Encounter for immunization: Secondary | ICD-10-CM

## 2013-10-06 DIAGNOSIS — B20 Human immunodeficiency virus [HIV] disease: Secondary | ICD-10-CM

## 2013-10-06 DIAGNOSIS — R21 Rash and other nonspecific skin eruption: Secondary | ICD-10-CM

## 2013-10-06 DIAGNOSIS — A630 Anogenital (venereal) warts: Secondary | ICD-10-CM

## 2013-10-06 LAB — COMPREHENSIVE METABOLIC PANEL WITH GFR
ALT: 76 U/L — ABNORMAL HIGH (ref 0–53)
AST: 61 U/L — ABNORMAL HIGH (ref 0–37)
Albumin: 4.1 g/dL (ref 3.5–5.2)
Alkaline Phosphatase: 75 U/L (ref 39–117)
BUN: 10 mg/dL (ref 6–23)
CO2: 23 meq/L (ref 19–32)
Calcium: 9.4 mg/dL (ref 8.4–10.5)
Chloride: 106 meq/L (ref 96–112)
Creat: 0.73 mg/dL (ref 0.50–1.35)
Glucose, Bld: 96 mg/dL (ref 70–99)
Potassium: 3.7 meq/L (ref 3.5–5.3)
Sodium: 138 meq/L (ref 135–145)
Total Bilirubin: 0.6 mg/dL (ref 0.3–1.2)
Total Protein: 7.7 g/dL (ref 6.0–8.3)

## 2013-10-06 NOTE — Assessment & Plan Note (Signed)
My great appreciation to Dr Mayford Knife for assistance with suggesting alt medicaiton regimen and for adherence counseling. Will check HLA [id number] to see if we can change to triumeq/TFV. Pt will call back on Monday to get results for switch. Could also change to DRVr/TRV/DTGV. Will see him back in 2 months. Check Hep B S Ab as well.

## 2013-10-06 NOTE — Progress Notes (Signed)
  Subjective:    Patient ID: [name], male    DOB: Feb 07, 1966, 47 y.o.   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  Was seen 06-25-10 with peri-rectal cellulitis and condyloma, no surgery and have since resolved. Had surgical f/u Jan 2013 and no conydloma noted.  Previously seen by ophtho: Has cataract in R eye, has f/u to eval to see if it needs to be removed. 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  ATVr/TRV/ISN. Had genotype 02-2012 and was naive.   Previously  Changed to DRVr/CBV/TFV Had genotype with lasts labs (naive) 12-17-12. Previous genotype showed INI-R. Genotype 2011 showed: K103N,M184V,G190A,L210W Has INI- 155H/N.  Has forgotten multiple doses of medication.  HIV 1 RNA Quant (copies/mL)  Date Value  12/17/2012 9922*  07/29/2012 11070*  02/27/2012 6735*     CD4 T Cell Abs (cmm)  Date Value  12/17/2012 250*  07/29/2012 320*  02/27/2012 260*   No pets  Review of Systems  Constitutional: Positive for fatigue. Negative for appetite change and unexpected weight change.  Gastrointestinal: Negative for diarrhea and constipation.  Genitourinary: Negative for difficulty urinating.  Skin: Positive for rash.       folliculitis  Neurological: Negative for headaches.       Objective:   Physical Exam  Constitutional: He appears well-developed and well-nourished.  HENT:  Mouth/Throat: Abnormal dentition. Dental caries present. No oropharyngeal exudate.  Eyes: EOM are normal. Pupils are equal, round, and reactive to light.  Neck: Neck supple.  Cardiovascular: Normal rate, regular rhythm and normal heart sounds.   Pulmonary/Chest: Effort normal and breath sounds normal.  Abdominal: Soft. Bowel sounds are normal. There is no tenderness. There is no rebound.  Lymphadenopathy:    He has no cervical adenopathy.  Skin: Rash noted. Rash is nodular.             Assessment & Plan:

## 2013-10-06 NOTE — Progress Notes (Signed)
[facility] - Pharmacist    HPI: [name] is a 47 y.o. male here for follow-up.  Allergies: Allergies  Allergen Reactions  . Apple Swelling    Vitals: Temp: 98.4 F (36.9 C) (11/19 1603) Temp src: Oral (11/19 1603) BP: 160/92 mmHg (11/19 1603) Pulse Rate: 84 (11/19 1603)  Past Medical History: Past Medical History  Diagnosis Date  . HIV disease   . Hepatitis C   . Venereal wart   . Candidiasis   . Hypokalemia   . Plantar nerve lesion   . Retinitis   . Hypertension   . Allergy   . Rectal bleeding   . Ulcer   . Syncope   . Abnormal finding on GI tract imaging   . Herpes iris     Social History: History   Social History  . Marital Status: Single    Spouse Name: N/A    Number of Children: N/A  . Years of Education: N/A   Social History Main Topics  . Smoking status: Never Smoker   . Smokeless tobacco: Never Used  . Alcohol Use: No  . Drug Use: No  . Sexual Activity: No     Comment: pt. declined condoms   Other Topics Concern  . None   Social History Narrative  . None    Current Regimen: Atazanavir/ritonavir, Combivir, Tenofovir  Labs: HIV 1 RNA Quant (copies/mL)  Date Value  12/17/2012 9922*  07/29/2012 11070*  02/27/2012 6735*     CD4 T Cell Abs (cmm)  Date Value  12/17/2012 250*  07/29/2012 320*  02/27/2012 260*     Hep B S Ab (no units)  Date Value  01/12/2007 NO      Hepatitis B Surface Ag (no units)  Date Value  01/12/2007 NO      HCV Ab (no units)  Date Value  01/12/2007 YES     HIV Genotype Composite Data Genotype Dates: 08/09/09, 05/27/11, 02/27/12, 07/29/12, 12/17/12  Mutations in [place] impact drug susceptibility RT Mutations M184V, K219R, K103N, V108I, P225H, V118I  PI Mutations L10V, M36I   Integrase Mutations N155H   Interpretation of Genotype Data per Stanford HIV Database Nucleoside RTIs  lamivudine (3TC) High-level resistance abacavir (ABC) Low-level resistance zidovudine  (AZT) Susceptible stavudine (D4T) Susceptible didanosine (DDI) Low-level resistance emtricitabine (FTC) High-level resistance tenofovir (TDF) Susceptible   Non-Nucleoside RTIs  efavirenz (EFV) High-level resistance etravirine (ETR) Susceptible nevirapine (NVP) High-level resistance rilpivirine (RPV) Susceptible   Protease Inhibitors  atazanavir/r (ATV/r) Susceptible darunavir/r (DRV/r) Susceptible fosamprenavir/r (FPV/r) Susceptible indinavir/r (IDV/r) Susceptible lopinavir/r (LPV/r) Susceptible nelfinavir (NFV) Susceptible saquinavir/r (SQV/r) Susceptible tipranavir/r (TPV/r) Susceptible   Integrase Inhibitors  dolutegravir (DTG) Potential low-level resistance elvitegravir (EVG) High-level resistance raltegravir (RAL) High-level resistance   CrCl: Estimated Creatinine Clearance: 146.1 ml/min (by C-G formula based on Cr of 0.64).  Lipids:    Component Value Date/Time   CHOL 126 12/17/2012 1513   TRIG 246* 12/17/2012 1513   HDL 26* 12/17/2012 1513   CHOLHDL 4.8 12/17/2012 1513   VLDL 49* 12/17/2012 1513   LDLCALC 51 12/17/2012 1513    Assessment: [name] continues to have difficulties with adherence.  He does not routinely take the evening dose of Combivir, and sometimes he misses all of his HIV medications.  He does have better compliance with his blood pressure medication.  He has multiple resistance mutations as outlined above.    We discussed the importance of medication adherence and how this affects resistance, viral load, and CD4  count.    Recommendations: Discussed with Dr. Ninetta Lights - if he is [id number] negative will plan to use Triumeq (abacavir/lamivudine/dolutegravir) with Tenofovir. He could benefit from close follow-up and additional counseling.  Sallee Provencal, Pharm.D., BCPS, AAHIVP Clinical Infectious Disease Pharmacist [facility] 10/06/2013, 4:29 PM

## 2013-10-06 NOTE — Addendum Note (Signed)
Addended by: Mariea Clonts D on: 10/06/2013 05:08 PM   Modules accepted: Orders

## 2013-10-06 NOTE — Assessment & Plan Note (Signed)
Folliciulitis. Could be vermin but he denies exposure. Try warm compresses. Neosporin.

## 2013-10-06 NOTE — Assessment & Plan Note (Signed)
Resolved. Will remind him about possible HRA in future.

## 2013-10-07 LAB — HEPATITIS C ANTIBODY: HCV Ab: REACTIVE — AB

## 2013-10-07 LAB — CBC
HCT: 42.6 % (ref 39.0–52.0)
MCHC: 36.2 g/dL — ABNORMAL HIGH (ref 30.0–36.0)
MCV: 105.7 fL — ABNORMAL HIGH (ref 78.0–100.0)
RDW: 15.6 % — ABNORMAL HIGH (ref 11.5–15.5)

## 2013-10-07 LAB — HEPATITIS B SURFACE ANTIBODY,QUALITATIVE: Hep B S Ab: NEGATIVE

## 2013-10-08 LAB — HIV-1 RNA QUANT-NO REFLEX-BLD: HIV-1 RNA Quant, Log: 3.91 {Log} — ABNORMAL HIGH (ref <1.30)

## 2013-10-17 LAB — HLA B*5701

## 2013-11-15 ENCOUNTER — Telehealth: Payer: Self-pay | Admitting: *Deleted

## 2013-11-15 NOTE — Telephone Encounter (Signed)
Called and left patient a message to return the call. Trying to get him scheduled for an HRA appt for 11/26/13. When he calls back will need to see if appointments still available. Wendall Mola

## 2013-11-26 ENCOUNTER — Ambulatory Visit: Payer: Self-pay | Admitting: Infectious Diseases

## 2014-01-03 ENCOUNTER — Other Ambulatory Visit: Payer: Self-pay | Admitting: *Deleted

## 2014-01-03 DIAGNOSIS — I10 Essential (primary) hypertension: Secondary | ICD-10-CM

## 2014-01-03 DIAGNOSIS — B20 Human immunodeficiency virus [HIV] disease: Secondary | ICD-10-CM

## 2014-01-03 MED ORDER — DARUNAVIR ETHANOLATE 800 MG PO TABS: 800.0000 mg | ORAL_TABLET | Freq: Every day | ORAL | Status: DC

## 2014-01-03 MED ORDER — LISINOPRIL 40 MG PO TABS: 40.0000 mg | ORAL_TABLET | Freq: Every day | ORAL | Status: DC

## 2014-01-03 MED ORDER — TENOFOVIR DISOPROXIL FUMARATE 300 MG PO TABS: 300.0000 mg | ORAL_TABLET | Freq: Every day | ORAL | Status: DC

## 2014-01-03 MED ORDER — LAMIVUDINE-ZIDOVUDINE 150-300 MG PO TABS: 1.0000 | ORAL_TABLET | Freq: Two times a day (BID) | ORAL | Status: DC

## 2014-01-03 MED ORDER — RITONAVIR 100 MG PO TABS: 100.0000 mg | ORAL_TABLET | Freq: Every day | ORAL | Status: DC

## 2014-01-05 ENCOUNTER — Ambulatory Visit: Payer: Self-pay

## 2014-01-05 ENCOUNTER — Telehealth: Payer: Self-pay | Admitting: Infectious Diseases

## 2014-01-05 ENCOUNTER — Ambulatory Visit: Payer: Self-pay | Admitting: Infectious Diseases

## 2014-01-05 NOTE — Telephone Encounter (Signed)
Has ADAP appt today, will see him today.

## 2014-01-12 ENCOUNTER — Ambulatory Visit: Payer: Self-pay | Admitting: *Deleted

## 2014-01-12 ENCOUNTER — Ambulatory Visit: Payer: Self-pay

## 2014-01-19 ENCOUNTER — Other Ambulatory Visit: Payer: Self-pay | Admitting: *Deleted

## 2014-01-19 DIAGNOSIS — B20 Human immunodeficiency virus [HIV] disease: Secondary | ICD-10-CM

## 2014-01-19 MED ORDER — RITONAVIR 100 MG PO TABS: 100.0000 mg | ORAL_TABLET | Freq: Every day | ORAL | Status: DC

## 2014-01-19 MED ORDER — LAMIVUDINE-ZIDOVUDINE 150-300 MG PO TABS: 1.0000 | ORAL_TABLET | Freq: Two times a day (BID) | ORAL | Status: DC

## 2014-01-19 MED ORDER — TENOFOVIR DISOPROXIL FUMARATE 300 MG PO TABS: 300.0000 mg | ORAL_TABLET | Freq: Every day | ORAL | Status: DC

## 2014-01-19 MED ORDER — DARUNAVIR ETHANOLATE 800 MG PO TABS: 800.0000 mg | ORAL_TABLET | Freq: Every day | ORAL | Status: DC

## 2014-01-24 ENCOUNTER — Telehealth: Payer: Self-pay | Admitting: *Deleted

## 2014-01-24 NOTE — Telephone Encounter (Signed)
Called patient and left message with his Mom for him to call the clinic. Trying to get him scheduled for HRA clinic on 02/04/14. Available times are 9:30 and 10:00 Am. Wendall MolaJacqueline Brenn Deziel

## 2014-02-07 ENCOUNTER — Other Ambulatory Visit: Payer: Self-pay | Admitting: *Deleted

## 2014-02-07 DIAGNOSIS — B20 Human immunodeficiency virus [HIV] disease: Secondary | ICD-10-CM

## 2014-02-07 MED ORDER — VALACYCLOVIR HCL 1 G PO TABS: 1000.0000 mg | ORAL_TABLET | Freq: Two times a day (BID) | ORAL | Status: DC

## 2014-02-16 ENCOUNTER — Ambulatory Visit (INDEPENDENT_AMBULATORY_CARE_PROVIDER_SITE_OTHER): Payer: Self-pay | Admitting: Infectious Diseases

## 2014-02-16 ENCOUNTER — Encounter: Payer: Self-pay | Admitting: Infectious Diseases

## 2014-02-16 VITALS — BP 160/96 | HR 76 | Temp 98.7°F | Ht 72.0 in | Wt 249.0 lb

## 2014-02-16 DIAGNOSIS — R21 Rash and other nonspecific skin eruption: Secondary | ICD-10-CM

## 2014-02-16 DIAGNOSIS — B20 Human immunodeficiency virus [HIV] disease: Secondary | ICD-10-CM

## 2014-02-16 DIAGNOSIS — K047 Periapical abscess without sinus: Secondary | ICD-10-CM

## 2014-02-16 DIAGNOSIS — I10 Essential (primary) hypertension: Secondary | ICD-10-CM

## 2014-02-16 MED ORDER — LISINOPRIL-HYDROCHLOROTHIAZIDE 20-25 MG PO TABS: 1.0000 | ORAL_TABLET | Freq: Every day | ORAL | Status: DC

## 2014-02-16 MED ORDER — ABACAVIR-DOLUTEGRAVIR-LAMIVUD 600-50-300 MG PO TABS: 1.0000 | ORAL_TABLET | Freq: Every day | ORAL | Status: DC

## 2014-02-16 NOTE — Assessment & Plan Note (Signed)
Above L ankle. Advised him to try HCT cream. If not improved consider derm.

## 2014-02-16 NOTE — Assessment & Plan Note (Signed)
Will try to get him onto dental list.

## 2014-02-16 NOTE — Assessment & Plan Note (Addendum)
Will change him to Triumeq/tfv. vax are up to date. Will see him back in 2 months. Offered/refused condoms.

## 2014-02-16 NOTE — Progress Notes (Signed)
[facility] - Pharmacist    HPI: [name] is a 48 y.o. male here for follow-up.  He has not had recent lab monitoring.  Allergies: Allergies  Allergen Reactions  . Apple Swelling    Vitals: Temp: 98.7 F (37.1 C) (04/01 1549) Temp src: Oral (04/01 1549) BP: 160/96 mmHg (04/01 1549) Pulse Rate: 76 (04/01 1549)  Past Medical History: Past Medical History  Diagnosis Date  . HIV disease   . Hepatitis C   . Venereal wart   . Candidiasis   . Hypokalemia   . Plantar nerve lesion   . Retinitis   . Hypertension   . Allergy   . Rectal bleeding   . Ulcer   . Syncope   . Abnormal finding on GI tract imaging   . Herpes iris     Social History: History   Social History  . Marital Status: Single    Spouse Name: N/A    Number of Children: N/A  . Years of Education: N/A   Social History Main Topics  . Smoking status: Never Smoker   . Smokeless tobacco: Never Used  . Alcohol Use: No  . Drug Use: No  . Sexual Activity: No     Comment: pt. declined condoms   Other Topics Concern  . None   Social History Narrative  . None    Current Regimen: Darunavir/ritonavir, Tenofovir, Combivir  Labs: HIV 1 RNA Quant (copies/mL)  Date Value  10/06/2013 8177*  12/17/2012 9922*  07/29/2012 11070*     CD4 T Cell Abs (/uL)  Date Value  10/06/2013 340*  12/17/2012 250*  07/29/2012 320*     Hep B S Ab (no units)  Date Value  10/06/2013 NEG      Hepatitis B Surface Ag (no units)  Date Value  01/12/2007 NO      HCV Ab (no units)  Date Value  10/06/2013 REACTIVE*    HIV Genotype Composite Data  Genotype Dates: 08/09/09, 05/27/11, 02/27/12, 07/29/12, 12/17/12  Mutations in [place] impact drug susceptibility  RT Mutations  M184V, K219R, K103N, V108I, P225H, V118I   PI Mutations  L10V, M36I    Integrase Mutations  N155H   Interpretation of Genotype Data per Stanford HIV Database  Nucleoside RTIs   lamivudine (3TC) High-level resistance   abacavir (ABC) Low-level resistance  zidovudine (AZT) Susceptible  stavudine (D4T) Susceptible  didanosine (DDI) Low-level resistance  emtricitabine (FTC) High-level resistance  tenofovir (TDF) Susceptible    Non-Nucleoside RTIs   efavirenz (EFV) High-level resistance  etravirine (ETR) Susceptible  nevirapine (NVP) High-level resistance  rilpivirine (RPV) Susceptible    Protease Inhibitors   atazanavir/r (ATV/r) Susceptible  darunavir/r (DRV/r) Susceptible  fosamprenavir/r (FPV/r) Susceptible  indinavir/r (IDV/r) Susceptible  lopinavir/r (LPV/r) Susceptible  nelfinavir (NFV) Susceptible  saquinavir/r (SQV/r) Susceptible  tipranavir/r (TPV/r) Susceptible    Integrase Inhibitors   dolutegravir (DTG) Potential low-level resistance  elvitegravir (EVG) High-level resistance  raltegravir (RAL) High-level resistance      CrCl: Estimated Creatinine Clearance: 146.5 ml/min (by C-G formula based on Cr of 0.73).  Lipids:    Component Value Date/Time   CHOL 126 12/17/2012 1513   TRIG 246* 12/17/2012 1513   HDL 26* 12/17/2012 1513   CHOLHDL 4.8 12/17/2012 1513   VLDL 49* 12/17/2012 1513   LDLCALC 51 12/17/2012 1513    Assessment: Mr. Helt continues to have difficulties with adherence. He does not routinely take the evening dose of Combivir, but he is doing better with  taking his HIV medications on a daily basis. He does have better compliance with his blood pressure medication but is concerned over elevated BP readings. He has multiple resistance mutations as outlined above. He is HLA [id number] negative.  We discussed the importance of medication adherence and how this affects resistance, viral load, and CD4 count.   Recommendations: Will discuss the option for a once-daily regimen for him to improve compliance.  At his last visit we had considered Triumeq with Tenofovir. He may need a second blood pressure agent.  Would consider using HCTZ 12.26m daily in addition to his  lisinopril.  TNorva Riffle Pharm.D., BCPS, AAHIVP Clinical [facility] 02/16/2014, 4:05 PM

## 2014-02-16 NOTE — Progress Notes (Signed)
   Subjective:    Patient ID: [name], male    DOB: 05/27/1966, 48 y.o.   MRN: [id number]  HPI 48 yo M HIV+ Was in hospital 9-19- to 08-14-09 with suspected PCP (DFA negative).  Was seen 06-25-10 with peri-rectal cellulitis and condyloma, no surgery and have since resolved. Had surgical f/u Jan 2013 and no condyloma noted.  Previously seen by ophtho: Has cataract in R eye, has f/u to eval to see if it needs to be removed. 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  ATVr/TRV/ISN. Had genotype 02-2012 and was naive.  Has had problems with HTN- has occas headaches. Takes ACE prior to going to work.  Has not been taking CBV pm dose due to work. .   Previous genotype showed INI- 155. Genotype 2011 showed: K103N,M184V,G190A,L210W  HIV 1 RNA Quant (copies/mL)  Date Value  10/06/2013 8177*  12/17/2012 9922*  07/29/2012 11070*     CD4 T Cell Abs (/uL)  Date Value  10/06/2013 340*  12/17/2012 250*  07/29/2012 320*    Review of Systems  Constitutional: Negative for appetite change and unexpected weight change.  Gastrointestinal: Negative for diarrhea and constipation.  Genitourinary: Negative for difficulty urinating.       Objective:   Physical Exam  Constitutional: He appears well-developed and well-nourished.  HENT:  Mouth/Throat: No oropharyngeal exudate.  Eyes: EOM are normal. Pupils are equal, round, and reactive to light.  Neck: Neck supple.  Cardiovascular: Normal rate, regular rhythm and normal heart sounds.   Pulmonary/Chest: Effort normal and breath sounds normal.  Abdominal: Soft. Bowel sounds are normal. He exhibits no distension. There is no tenderness.  Lymphadenopathy:    He has no cervical adenopathy.          Assessment & Plan:

## 2014-02-16 NOTE — Assessment & Plan Note (Signed)
Will change his meds to ACE+HCTZ. See him back in 2 months.

## 2014-03-13 ENCOUNTER — Emergency Department (HOSPITAL_COMMUNITY)
Admission: EM | Admit: 2014-03-13 | Discharge: 2014-03-13 | Disposition: A | Discharge status: Home / Self Care | Payer: Self-pay | Attending: Emergency Medicine | Admitting: Emergency Medicine

## 2014-03-13 ENCOUNTER — Encounter (HOSPITAL_COMMUNITY): Payer: Self-pay | Admitting: Emergency Medicine

## 2014-03-13 DIAGNOSIS — Z8719 Personal history of other diseases of the digestive system: Secondary | ICD-10-CM | POA: Insufficient documentation

## 2014-03-13 DIAGNOSIS — H9202 Otalgia, left ear: Secondary | ICD-10-CM

## 2014-03-13 DIAGNOSIS — Z8639 Personal history of other endocrine, nutritional and metabolic disease: Secondary | ICD-10-CM | POA: Insufficient documentation

## 2014-03-13 DIAGNOSIS — Z872 Personal history of diseases of the skin and subcutaneous tissue: Secondary | ICD-10-CM | POA: Insufficient documentation

## 2014-03-13 DIAGNOSIS — Z8669 Personal history of other diseases of the nervous system and sense organs: Secondary | ICD-10-CM | POA: Insufficient documentation

## 2014-03-13 DIAGNOSIS — I10 Essential (primary) hypertension: Secondary | ICD-10-CM | POA: Insufficient documentation

## 2014-03-13 DIAGNOSIS — Z79899 Other long term (current) drug therapy: Secondary | ICD-10-CM | POA: Insufficient documentation

## 2014-03-13 DIAGNOSIS — H6120 Impacted cerumen, unspecified ear: Secondary | ICD-10-CM | POA: Insufficient documentation

## 2014-03-13 DIAGNOSIS — Z21 Asymptomatic human immunodeficiency virus [HIV] infection status: Secondary | ICD-10-CM | POA: Insufficient documentation

## 2014-03-13 DIAGNOSIS — Z8619 Personal history of other infectious and parasitic diseases: Secondary | ICD-10-CM | POA: Insufficient documentation

## 2014-03-13 DIAGNOSIS — Z862 Personal history of diseases of the blood and blood-forming organs and certain disorders involving the immune mechanism: Secondary | ICD-10-CM | POA: Insufficient documentation

## 2014-03-13 MED ORDER — ACETIC ACID 2 % OT SOLN: 4.0000 [drp] | Freq: Three times a day (TID) | OTIC | Status: AC

## 2014-03-13 NOTE — ED Provider Notes (Signed)
Medical screening examination/treatment/procedure(s) were performed by non-physician practitioner and as supervising physician I was immediately available for consultation/collaboration.   EKG Interpretation None      Devoria AlbeIva Avonne Berkery, MD, Armando GangFACEP   Ward GivensIva L Janelys Glassner, MD 03/13/14 1728

## 2014-03-13 NOTE — Discharge Instructions (Signed)
Return here as needed. Follow up with your doctor. °

## 2014-03-13 NOTE — ED Notes (Signed)
The pt is c/o some bi-lateral ear discomfort for one week.  He feels like his ears need to pop and his hearing is muffled

## 2014-03-13 NOTE — ED Provider Notes (Signed)
CSN: [id number]     Arrival date & time 03/13/14  1627 History   First MD Initiated Contact with Patient 03/13/14 1638     This chart was scribed for non-physician practitioner, Ebbie Ridgehris Madhuri Vacca PA-C working with Ward GivensIva L Knapp, MD by Arlan OrganAshley Leger, ED Scribe. This patient was seen in room TR07C/TR07C and the patient's care was started at 4:47 PM.   Chief Complaint  Patient presents with  . Otalgia   HPI  HPI Comments: [name] is a 48 y.o. male who presents to the Emergency Department complaining of ongoing, constant, mild bilateral otalgia x 1 week that is unchanged. Pt describes this feeling as "clogged". Denies any previous occurrence of this pain. At this time he denies any fever or chills. No other concerns this visit.  Past Medical History  Diagnosis Date  . HIV disease   . Hepatitis C   . Venereal wart   . Candidiasis   . Hypokalemia   . Plantar nerve lesion   . Retinitis   . Hypertension   . Allergy   . Rectal bleeding   . Ulcer   . Syncope   . Abnormal finding on GI tract imaging   . Herpes iris    History reviewed. No pertinent past surgical history. Family History  Problem Relation Age of Onset  . Hypertension Sister   . Hypertension Brother   . Cancer Mother     pancreatic cancer   History  Substance Use Topics  . Smoking status: Never Smoker   . Smokeless tobacco: Never Used  . Alcohol Use: No    Review of Systems  Constitutional: Negative for fever and chills.  HENT: Positive for ear pain (Bilateral otalgia). Negative for congestion.   Eyes: Negative for redness.  Respiratory: Negative for cough.   Skin: Negative for rash.  Psychiatric/Behavioral: Negative for confusion.      Allergies  Apple  Home Medications   Prior to Admission medications   Medication Sig Start Date End Date Taking? Authorizing Provider  Abacavir-Dolutegravir-Lamivud (TRIUMEQ) 600-50-300 MG TABS Take 1 tablet by mouth daily. 02/16/14   Ginnie SmartJeffrey C Hatcher, MD   lamiVUDine-zidovudine (COMBIVIR) 150-300 MG per tablet Take 1 tablet by mouth 2 (two) times daily. 01/19/14   Cliffton AstersJohn Campbell, MD  lisinopril-hydrochlorothiazide (PRINZIDE,ZESTORETIC) 20-25 MG per tablet Take 1 tablet by mouth daily. 02/16/14   Ginnie SmartJeffrey C Hatcher, MD  tenofovir (VIREAD) 300 MG tablet Take 1 tablet (300 mg total) by mouth daily. 01/19/14   Cliffton AstersJohn Campbell, MD  valACYclovir (VALTREX) 1000 MG tablet Take 1 tablet (1,000 mg total) by mouth 2 (two) times daily. 02/07/14   Randall Hissornelius N Van Dam, MD   Triage Vitals: BP 161/88  Pulse 93  Temp(Src) 99.1 F (37.3 C) (Oral)  Resp 18  Ht 6' (1.829 m)  Wt 243 lb (110.224 kg)  BMI 32.95 kg/m2  SpO2 98%   Physical Exam  Nursing note and vitals reviewed. Constitutional: He is oriented to person, place, and time. He appears well-developed and well-nourished.  HENT:  Head: Normocephalic and atraumatic.  Patient has cerumen impaction in the left ear canal and just a small amount of cerumen noted in the right ear canal.  Neck: Normal range of motion.  Cardiovascular: Normal rate.   Pulmonary/Chest: Effort normal.  Musculoskeletal: Normal range of motion.  Neurological: He is alert and oriented to person, place, and time.  Skin: Skin is warm and dry.  Psychiatric: He has a normal mood and affect. His behavior is normal.  ED Course  Procedures (including critical care time)  DIAGNOSTIC STUDIES: Oxygen Saturation is 98% on RA, Normal by my interpretation.    COORDINATION OF CARE: 4:49 PM-Discussed treatment plan with pt at bedside and pt agreed to plan.     The nurse will irrigate the left ear canal.  Patient is advised of plan  I personally performed the services described in this documentation, which was scribed in my presence. The recorded information has been reviewed and is accurate.    Carlyle Dollyhristopher W Lucero Auzenne, PA-C 03/13/14 1701

## 2014-04-04 ENCOUNTER — Other Ambulatory Visit (INDEPENDENT_AMBULATORY_CARE_PROVIDER_SITE_OTHER): Payer: Self-pay

## 2014-04-04 DIAGNOSIS — B20 Human immunodeficiency virus [HIV] disease: Secondary | ICD-10-CM

## 2014-04-04 DIAGNOSIS — I10 Essential (primary) hypertension: Secondary | ICD-10-CM

## 2014-04-05 LAB — COMPREHENSIVE METABOLIC PANEL
ALBUMIN: 4.3 g/dL (ref 3.5–5.2)
ALK PHOS: 58 U/L (ref 39–117)
ALT: 72 U/L — ABNORMAL HIGH (ref 0–53)
AST: 60 U/L — AB (ref 0–37)
BUN: 9 mg/dL (ref 6–23)
CALCIUM: 9.7 mg/dL (ref 8.4–10.5)
CHLORIDE: 104 meq/L (ref 96–112)
CO2: 23 mEq/L (ref 19–32)
Creat: 0.57 mg/dL (ref 0.50–1.35)
Glucose, Bld: 86 mg/dL (ref 70–99)
POTASSIUM: 3.7 meq/L (ref 3.5–5.3)
SODIUM: 140 meq/L (ref 135–145)
TOTAL PROTEIN: 8 g/dL (ref 6.0–8.3)
Total Bilirubin: 0.5 mg/dL (ref 0.2–1.2)

## 2014-04-05 LAB — HIV-1 RNA QUANT-NO REFLEX-BLD
HIV 1 RNA Quant: 52955 copies/mL — ABNORMAL HIGH (ref <20)
HIV-1 RNA Quant, Log: 4.72 {Log} — ABNORMAL HIGH (ref <1.30)

## 2014-04-05 LAB — T-HELPER CELL (CD4) - (RCID CLINIC ONLY)
CD4 % Helper T Cell: 11 % — ABNORMAL LOW (ref 33–55)
CD4 T Cell Abs: 240 /uL — ABNORMAL LOW (ref 400–2700)

## 2014-04-18 ENCOUNTER — Ambulatory Visit: Payer: Self-pay | Admitting: Infectious Diseases

## 2014-04-19 ENCOUNTER — Telehealth: Payer: Self-pay | Admitting: *Deleted

## 2014-04-19 NOTE — Telephone Encounter (Signed)
Called patient at Dr. Moshe Cipro request to have him come in this month. Pt is detectable, no-showed his appointment 6/1.  Unable to leave patient a message at any listed number. Will refer to [facility]. Andree Coss, RN

## 2014-05-11 ENCOUNTER — Other Ambulatory Visit: Payer: Self-pay | Admitting: Infectious Diseases

## 2014-06-11 ENCOUNTER — Other Ambulatory Visit: Payer: Self-pay | Admitting: Infectious Disease

## 2014-06-13 ENCOUNTER — Other Ambulatory Visit: Payer: Self-pay | Admitting: Licensed Clinical Social Worker

## 2014-06-13 MED ORDER — VALACYCLOVIR HCL 1 G PO TABS: ORAL_TABLET | ORAL | Status: DC

## 2014-06-14 ENCOUNTER — Ambulatory Visit: Payer: Self-pay

## 2014-07-04 ENCOUNTER — Ambulatory Visit: Payer: Self-pay

## 2014-07-11 ENCOUNTER — Ambulatory Visit: Payer: Self-pay

## 2014-07-15 ENCOUNTER — Other Ambulatory Visit: Payer: Self-pay | Admitting: *Deleted

## 2014-07-15 DIAGNOSIS — B20 Human immunodeficiency virus [HIV] disease: Secondary | ICD-10-CM

## 2014-07-15 MED ORDER — TENOFOVIR DISOPROXIL FUMARATE 300 MG PO TABS: 300.0000 mg | ORAL_TABLET | Freq: Every day | ORAL | Status: DC

## 2014-07-15 MED ORDER — ABACAVIR-DOLUTEGRAVIR-LAMIVUD 600-50-300 MG PO TABS: 1.0000 | ORAL_TABLET | Freq: Every day | ORAL | Status: DC

## 2014-08-06 ENCOUNTER — Other Ambulatory Visit: Payer: Self-pay | Admitting: Infectious Diseases

## 2014-08-24 ENCOUNTER — Telehealth: Payer: Self-pay | Admitting: Infectious Diseases

## 2014-08-26 NOTE — Telephone Encounter (Signed)
error 

## 2014-09-10 ENCOUNTER — Other Ambulatory Visit: Payer: Self-pay | Admitting: Infectious Diseases

## 2014-09-12 ENCOUNTER — Telehealth: Payer: Self-pay | Admitting: *Deleted

## 2014-09-12 ENCOUNTER — Other Ambulatory Visit: Payer: Self-pay | Admitting: Infectious Diseases

## 2014-09-12 NOTE — Telephone Encounter (Signed)
[facility] Asked them to call back, confirm. Attempted to call patient - he needs follow up.  Phone number was busy.  Will call again.

## 2014-09-12 NOTE — Telephone Encounter (Signed)
Med list corrected 

## 2014-09-12 NOTE — Telephone Encounter (Signed)
Patient has many old ART  Medications active on his medication list, has been requesting refills.  Please advise on current regimen, please discontinue old medications.  RN will call pharmacy with accurate ART. Andree CossHowell, Enas Winchel M, RN

## 2014-09-12 NOTE — Telephone Encounter (Signed)
[facility]  Asked them to call back and confirm.  Patient needs follow up appointment - he is detectable, has not been seen since 02/2014.  RN called patient, line was busy, unable to leave message. Andree CossHowell, Delaina Fetsch M, RN

## 2014-09-14 NOTE — Telephone Encounter (Signed)
[facility] Patient has been picking up incorrect regimen for last 2 months.  Unable to reach patient. [facility] will contact patient, will hold his medication until they speak with him to have him come in for labs/follow up. Andree CossHowell, Maddyx Wieck M, RN

## 2014-10-12 ENCOUNTER — Other Ambulatory Visit: Payer: Self-pay

## 2014-12-09 ENCOUNTER — Other Ambulatory Visit: Payer: Self-pay | Admitting: Infectious Disease

## 2014-12-09 ENCOUNTER — Other Ambulatory Visit: Payer: Self-pay | Admitting: Infectious Diseases

## 2015-01-04 ENCOUNTER — Telehealth: Payer: Self-pay | Admitting: *Deleted

## 2015-01-04 ENCOUNTER — Other Ambulatory Visit: Payer: Self-pay | Admitting: Infectious Diseases

## 2015-01-04 NOTE — Telephone Encounter (Signed)
thanks

## 2015-01-04 NOTE — Telephone Encounter (Signed)
Patient has not been seen since 02/2014.  Patient requesting refill of hypertension meds, needs an appointment.  RN called patient's phone number, patient's mother (emergency contact) answered, she will have patient call us for an appointment. Andree CossHowell, Kawanna Christley M, RN

## 2015-01-08 ENCOUNTER — Other Ambulatory Visit: Payer: Self-pay | Admitting: Internal Medicine

## 2015-01-08 DIAGNOSIS — B20 Human immunodeficiency virus [HIV] disease: Secondary | ICD-10-CM

## 2015-01-16 ENCOUNTER — Ambulatory Visit (INDEPENDENT_AMBULATORY_CARE_PROVIDER_SITE_OTHER): Payer: Self-pay | Admitting: Infectious Diseases

## 2015-01-16 ENCOUNTER — Encounter: Payer: Self-pay | Admitting: Infectious Diseases

## 2015-01-16 ENCOUNTER — Ambulatory Visit: Payer: Self-pay

## 2015-01-16 VITALS — BP 158/84 | HR 91 | Temp 98.7°F | Wt 235.0 lb

## 2015-01-16 DIAGNOSIS — I10 Essential (primary) hypertension: Secondary | ICD-10-CM

## 2015-01-16 DIAGNOSIS — Z79899 Other long term (current) drug therapy: Secondary | ICD-10-CM

## 2015-01-16 DIAGNOSIS — Z113 Encounter for screening for infections with a predominantly sexual mode of transmission: Secondary | ICD-10-CM

## 2015-01-16 DIAGNOSIS — A630 Anogenital (venereal) warts: Secondary | ICD-10-CM

## 2015-01-16 DIAGNOSIS — Z23 Encounter for immunization: Secondary | ICD-10-CM

## 2015-01-16 DIAGNOSIS — B20 Human immunodeficiency virus [HIV] disease: Secondary | ICD-10-CM

## 2015-01-16 DIAGNOSIS — R21 Rash and other nonspecific skin eruption: Secondary | ICD-10-CM

## 2015-01-16 NOTE — Addendum Note (Signed)
Addended by: Jaye Saal C on: 01/16/2015 02:36 PM   Modules accepted: Orders

## 2015-01-16 NOTE — Addendum Note (Signed)
Addended by: Wendall MolaOCKERHAM, JACQUELINE A on: 01/16/2015 02:57 PM   Modules accepted: Orders

## 2015-01-16 NOTE — Progress Notes (Signed)
   Subjective:    Patient ID: [name], male    DOB: 06/13/1966, 49 y.o.   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 He is on Triumeq/tfv, started 02-2014. He has had no difficulty with this. Taking his BP meds, he feels lightheadedness if he misses. Denies missed doses for several months. Taking lisinopril.   HIV 1 RNA QUANT (copies/mL)  Date Value  04/04/2014 52955*  10/06/2013 8177*  12/17/2012 9922*   CD4 T CELL ABS  Date Value  04/04/2014 240 /uL*  10/06/2013 340 /uL*  12/17/2012 250 cmm*   Review of Systems  Constitutional: Negative for appetite change and unexpected weight change.  Respiratory: Negative for shortness of breath.   Cardiovascular: Negative for chest pain.  Gastrointestinal: Negative for diarrhea and constipation.  Genitourinary: Negative for difficulty urinating.  checks his BP at drug store, usually similar to today. Also checked at church event and told it was slightly high.      Objective:   Physical Exam  Constitutional: He appears well-developed and well-nourished.  HENT:  Mouth/Throat: No oropharyngeal exudate.  Eyes: EOM are normal. Pupils are equal, round, and reactive to light.  Neck: Neck supple.  Cardiovascular: Normal rate, regular rhythm and normal heart sounds.   Pulmonary/Chest: Effort normal and breath sounds normal.  Abdominal: Soft. Bowel sounds are normal. He exhibits no distension. There is no tenderness.  Lymphadenopathy:    He has no cervical adenopathy.  Skin:             Assessment & Plan:

## 2015-01-16 NOTE — Assessment & Plan Note (Signed)
This was seen previously.  Will try to get him into derm.

## 2015-01-16 NOTE — Assessment & Plan Note (Signed)
Refuses HRA

## 2015-01-16 NOTE — Assessment & Plan Note (Signed)
Will have him seen by PCP 

## 2015-01-16 NOTE — Assessment & Plan Note (Signed)
Appears to be doing well but need to check his labs.  Will restart Hep B series.  Give flu vax today.  Offered/refused condoms.  See back in 3 months.

## 2015-01-23 ENCOUNTER — Other Ambulatory Visit: Payer: Self-pay | Admitting: *Deleted

## 2015-01-23 DIAGNOSIS — B20 Human immunodeficiency virus [HIV] disease: Secondary | ICD-10-CM

## 2015-01-23 MED ORDER — ABACAVIR-DOLUTEGRAVIR-LAMIVUD 600-50-300 MG PO TABS: 1.0000 | ORAL_TABLET | Freq: Every day | ORAL | Status: DC

## 2015-01-23 NOTE — Telephone Encounter (Signed)
ADAP Application 

## 2015-01-24 ENCOUNTER — Telehealth: Payer: Self-pay | Admitting: *Deleted

## 2015-01-24 NOTE — Telephone Encounter (Signed)
Left message with patient's mother to please have patient come back in for lab work. He left after the office visit without having his labs done. Wendall MolaJacqueline Cockerham

## 2015-01-26 ENCOUNTER — Other Ambulatory Visit: Payer: Self-pay | Admitting: *Deleted

## 2015-01-26 DIAGNOSIS — Z113 Encounter for screening for infections with a predominantly sexual mode of transmission: Secondary | ICD-10-CM

## 2015-02-06 ENCOUNTER — Encounter (HOSPITAL_COMMUNITY): Payer: Self-pay | Admitting: Emergency Medicine

## 2015-02-06 ENCOUNTER — Emergency Department (HOSPITAL_COMMUNITY)
Admission: EM | Admit: 2015-02-06 | Discharge: 2015-02-06 | Disposition: A | Discharge status: Home / Self Care | Payer: Self-pay | Attending: Emergency Medicine | Admitting: Emergency Medicine

## 2015-02-06 DIAGNOSIS — H6123 Impacted cerumen, bilateral: Secondary | ICD-10-CM | POA: Insufficient documentation

## 2015-02-06 DIAGNOSIS — Z8719 Personal history of other diseases of the digestive system: Secondary | ICD-10-CM | POA: Insufficient documentation

## 2015-02-06 DIAGNOSIS — Z8619 Personal history of other infectious and parasitic diseases: Secondary | ICD-10-CM | POA: Insufficient documentation

## 2015-02-06 DIAGNOSIS — Z8639 Personal history of other endocrine, nutritional and metabolic disease: Secondary | ICD-10-CM | POA: Insufficient documentation

## 2015-02-06 DIAGNOSIS — I10 Essential (primary) hypertension: Secondary | ICD-10-CM | POA: Insufficient documentation

## 2015-02-06 DIAGNOSIS — Z872 Personal history of diseases of the skin and subcutaneous tissue: Secondary | ICD-10-CM | POA: Insufficient documentation

## 2015-02-06 DIAGNOSIS — Z79899 Other long term (current) drug therapy: Secondary | ICD-10-CM | POA: Insufficient documentation

## 2015-02-06 DIAGNOSIS — Z21 Asymptomatic human immunodeficiency virus [HIV] infection status: Secondary | ICD-10-CM | POA: Insufficient documentation

## 2015-02-06 MED ORDER — ONDANSETRON 4 MG PO TBDP
4.0000 mg | ORAL_TABLET | Freq: Once | ORAL | Status: AC
  Administered 2015-02-06: 4 mg via ORAL
  Filled 2015-02-06: qty 1

## 2015-02-06 NOTE — ED Provider Notes (Addendum)
CSN: [id number]     Arrival date & time 02/06/15  1308 History   First MD Initiated Contact with Patient 02/06/15 [phone]     Chief Complaint  Patient presents with  . Ear Fullness     (Consider location/radiation/quality/duration/timing/severity/associated sxs/prior Treatment) HPI [name] is a 49 y.o. male with past medical history of HIV, hepatitis C presenting today with cerumen impaction. Patient states this happens to him about once a year. He denies putting anything in his ear. When it started to build he did put his pinky in his ear attempting to get the wax out but believes this pushed it further in. He is complaining of wax on the left side and decreased hearing. He denies any pain in his ear or viral URI like symptoms. He has no fevers or recent infections. Patient has no further complaints.  10 Systems reviewed and are negative for acute change except as noted in the HPI.     Past Medical History  Diagnosis Date  . HIV disease   . Hepatitis C   . Venereal wart   . Candidiasis   . Hypokalemia   . Plantar nerve lesion   . Retinitis   . Hypertension   . Allergy   . Rectal bleeding   . Ulcer   . Syncope   . Abnormal finding on GI tract imaging   . Herpes iris    History reviewed. No pertinent past surgical history. Family History  Problem Relation Age of Onset  . Hypertension Sister   . Hypertension Brother   . Cancer Mother     pancreatic cancer   History  Substance Use Topics  . Smoking status: Never Smoker   . Smokeless tobacco: Never Used  . Alcohol Use: No    Review of Systems    Allergies  Apple  Home Medications   Prior to Admission medications   Medication Sig Start Date End Date Taking? Authorizing Provider  Abacavir-Dolutegravir-Lamivud (TRIUMEQ) 600-50-300 MG TABS Take 1 tablet by mouth daily. 01/23/15   Cliffton Asters, MD  ibuprofen (ADVIL,MOTRIN) 200 MG tablet Take 400 mg by mouth every 6 (six) hours as needed for mild pain.     Historical Provider, MD  lisinopril (PRINIVIL,ZESTRIL) 40 MG tablet TAKE 1 TABLET BY MOUTH DAILY 01/04/15   Ginnie Smart, MD  valACYclovir (VALTREX) 1000 MG tablet TAKE 1 TABLET BY MOUTH TWICE DAILY 12/12/14   Ginnie Smart, MD  VIREAD 300 MG tablet TAKE 1 TABLET BY MOUTH EVERY DAY 01/09/15   Gardiner Barefoot, MD   BP 151/105 mmHg  Pulse 74  Temp(Src) 98.1 F (36.7 C) (Oral)  Resp 18  SpO2 98% Physical Exam  Constitutional: He is oriented to person, place, and time. Vital signs are normal. He appears well-developed and well-nourished.  Non-toxic appearance. He does not appear ill. No distress.  HENT:  Head: Normocephalic and atraumatic.  Right Ear: External ear normal.  Left Ear: External ear normal.  Nose: Nose normal.  Mouth/Throat: Oropharynx is clear and moist. No oropharyngeal exudate.  Bilateral ears with cerumen impaction. TM cannot be visualized.  Eyes: Conjunctivae and EOM are normal. Pupils are equal, round, and reactive to light. No scleral icterus.  Neck: Normal range of motion. Neck supple. No tracheal deviation, no edema, no erythema and normal range of motion present. No thyroid mass and no thyromegaly present.  Cardiovascular: Normal rate, regular rhythm, S1 normal, S2 normal, normal heart sounds, intact distal pulses and normal pulses.  Exam  reveals no gallop and no friction rub.   No murmur heard. Pulses:      Radial pulses are 2+ on the right side, and 2+ on the left side.       Dorsalis pedis pulses are 2+ on the right side, and 2+ on the left side.  Pulmonary/Chest: Effort normal and breath sounds normal. No respiratory distress. He has no wheezes. He has no rhonchi. He has no rales.  Abdominal: Soft. Normal appearance and bowel sounds are normal. He exhibits no distension, no ascites and no mass. There is no hepatosplenomegaly. There is no tenderness. There is no rebound, no guarding and no CVA tenderness.  Musculoskeletal: Normal range of motion. He exhibits no  edema or tenderness.  Lymphadenopathy:    He has no cervical adenopathy.  Neurological: He is alert and oriented to person, place, and time. He has normal strength. No cranial nerve deficit or sensory deficit.  Skin: Skin is warm, dry and intact. No petechiae and no rash noted. He is not diaphoretic. No erythema. No pallor.  Psychiatric: He has a normal mood and affect. His behavior is normal. Judgment normal.  Nursing note and vitals reviewed.   ED Course  Procedures (including critical care time) Labs Review Labs Reviewed - No data to display  Imaging Review No results found.   EKG Interpretation None      MDM   Final diagnoses:  Cerumen impaction, bilateral    Patient since emergency department for cerumen impaction both ears. This was drained in the emergency department. Repeat evaluation shows some mild erythematous TMs bilaterally, likely due to the flushing. Patient had some nausea afterwards and was given Zofran. He was advised not to put anything in his ears, ENT follow-up was provided. His vital signs remain within his normal limits and he is safe for discharge.  Ceruminosis is noted.  Wax is removed by syringing and manual debridement. Instructions for home care to prevent wax buildup are given.     Tomasita CrumbleAdeleke Nancyann Cotterman, MD 02/06/15 Jeralyn [id number]  Tomasita CrumbleAdeleke Jadelyn Elks, MD 02/06/15 [phone]

## 2015-02-06 NOTE — Discharge Instructions (Signed)
Cerumen Impaction Mr. Paul FieldWright, do not use anything or put anything in your ear to try to clean out or prevent earwax. Follow-up with ENT for continued management. Also see a primary care doctor within 3 days for a repeat blood pressure check. If any symptoms worsen come back to the emergency department immediately. Thank you. A cerumen impaction is when the wax in your ear forms a plug. This plug usually causes reduced hearing. Sometimes it also causes an earache or dizziness. Removing a cerumen impaction can be difficult and painful. The wax sticks to the ear canal. The canal is sensitive and bleeds easily. If you try to remove a heavy wax buildup with a cotton tipped swab, you may push it in further. Irrigation with water, suction, and small ear curettes may be used to clear out the wax. If the impaction is fixed to the skin in the ear canal, ear drops may be needed for a few days to loosen the wax. People who build up a lot of wax frequently can use ear wax removal products available in your local drugstore. SEEK MEDICAL CARE IF:  You develop an earache, increased hearing loss, or marked dizziness. Document Released: 12/12/2004 Document Revised: 01/27/2012 Document Reviewed: 02/01/2010 [facility] Patient Information 2015 [place], [place]. This information is not intended to replace advice given to you by your health care provider. Make sure you discuss any questions you have with your health care provider. Hypertension Hypertension is another name for high blood pressure. High blood pressure forces your heart to work harder to pump blood. A blood pressure reading has two numbers, which includes a higher number over a lower number (example: 110/72). HOME CARE   Have your blood pressure rechecked by your doctor.  Only take medicine as told by your doctor. Follow the directions carefully. The medicine does not work as well if you skip doses. Skipping doses also puts you at risk for problems.  Do not  smoke.  Monitor your blood pressure at home as told by your doctor. GET HELP IF:  You think you are having a reaction to the medicine you are taking.  You have repeat headaches or feel dizzy.  You have puffiness (swelling) in your ankles.  You have trouble with your vision. GET HELP RIGHT AWAY IF:   You get a very bad headache and are confused.  You feel weak, numb, or faint.  You get chest or belly (abdominal) pain.  You throw up (vomit).  You cannot breathe very well. MAKE SURE YOU:   Understand these instructions.  Will watch your condition.  Will get help right away if you are not doing well or get worse. Document Released: 04/22/2008 Document Revised: 11/09/2013 Document Reviewed: 08/27/2013 [facility] Patient Information 2015 [place], [place]. This information is not intended to replace advice given to you by your health care provider. Make sure you discuss any questions you have with your health care provider.

## 2015-02-11 ENCOUNTER — Other Ambulatory Visit: Payer: Self-pay | Admitting: Internal Medicine

## 2015-02-11 ENCOUNTER — Other Ambulatory Visit: Payer: Self-pay | Admitting: Infectious Diseases

## 2015-03-11 ENCOUNTER — Other Ambulatory Visit: Payer: Self-pay | Admitting: Infectious Diseases

## 2015-04-19 ENCOUNTER — Ambulatory Visit: Payer: Self-pay | Admitting: Infectious Diseases

## 2015-04-19 ENCOUNTER — Other Ambulatory Visit: Payer: Self-pay | Admitting: Infectious Diseases

## 2015-05-20 ENCOUNTER — Other Ambulatory Visit: Payer: Self-pay | Admitting: Infectious Diseases

## 2015-06-15 ENCOUNTER — Encounter: Payer: Self-pay | Admitting: Gastroenterology

## 2015-07-15 ENCOUNTER — Other Ambulatory Visit: Payer: Self-pay | Admitting: Infectious Diseases

## 2015-08-16 ENCOUNTER — Other Ambulatory Visit: Payer: Self-pay | Admitting: Infectious Diseases

## 2015-08-22 ENCOUNTER — Ambulatory Visit: Payer: Self-pay

## 2015-08-22 ENCOUNTER — Other Ambulatory Visit: Payer: Self-pay | Admitting: *Deleted

## 2015-08-22 ENCOUNTER — Other Ambulatory Visit (INDEPENDENT_AMBULATORY_CARE_PROVIDER_SITE_OTHER): Payer: Self-pay

## 2015-08-22 DIAGNOSIS — Z113 Encounter for screening for infections with a predominantly sexual mode of transmission: Secondary | ICD-10-CM

## 2015-08-22 DIAGNOSIS — B20 Human immunodeficiency virus [HIV] disease: Secondary | ICD-10-CM

## 2015-08-22 LAB — COMPLETE METABOLIC PANEL WITH GFR
ALBUMIN: 4.3 g/dL (ref 3.6–5.1)
ALT: 56 U/L — AB (ref 9–46)
AST: 47 U/L — AB (ref 10–40)
Alkaline Phosphatase: 60 U/L (ref 40–115)
BUN: 7 mg/dL (ref 7–25)
CALCIUM: 9.2 mg/dL (ref 8.6–10.3)
CO2: 26 mmol/L (ref 20–31)
CREATININE: 0.71 mg/dL (ref 0.60–1.35)
Chloride: 105 mmol/L (ref 98–110)
GFR, Est African American: >89 mL/min (ref >=60)
GFR, Est Non African American: >89 mL/min (ref >=60)
GLUCOSE: 92 mg/dL (ref 65–99)
Potassium: 3.4 mmol/L — ABNORMAL LOW (ref 3.5–5.3)
SODIUM: 138 mmol/L (ref 135–146)
Total Bilirubin: 0.5 mg/dL (ref 0.2–1.2)
Total Protein: 7.5 g/dL (ref 6.1–8.1)

## 2015-08-22 LAB — CBC WITH DIFFERENTIAL/PLATELET
BASOS ABS: 0 10*3/uL (ref 0.0–0.1)
Basophils Relative: 0 % (ref 0–1)
Eosinophils Absolute: 0.1 10*3/uL (ref 0.0–0.7)
Eosinophils Relative: 3 % (ref 0–5)
HEMATOCRIT: 40.5 % (ref 39.0–52.0)
HEMOGLOBIN: 14.5 g/dL (ref 13.0–17.0)
LYMPHS ABS: 1.5 10*3/uL (ref 0.7–4.0)
Lymphocytes Relative: 46 % (ref 12–46)
MCH: 34.8 pg — ABNORMAL HIGH (ref 26.0–34.0)
MCHC: 35.8 g/dL (ref 30.0–36.0)
MCV: 97.1 fL (ref 78.0–100.0)
MONO ABS: 0.5 10*3/uL (ref 0.1–1.0)
MPV: 9.6 fL (ref 8.6–12.4)
Monocytes Relative: 15 % — ABNORMAL HIGH (ref 3–12)
Neutro Abs: 1.2 10*3/uL — ABNORMAL LOW (ref 1.7–7.7)
Neutrophils Relative %: 36 % — ABNORMAL LOW (ref 43–77)
Platelets: 224 10*3/uL (ref 150–400)
RBC: 4.17 MIL/uL — AB (ref 4.22–5.81)
RDW: 13 % (ref 11.5–15.5)
WBC: 3.3 10*3/uL — ABNORMAL LOW (ref 4.0–10.5)

## 2015-08-23 LAB — RPR

## 2015-08-24 LAB — URINE CYTOLOGY ANCILLARY ONLY
CHLAMYDIA, DNA PROBE: NEGATIVE
NEISSERIA GONORRHEA: NEGATIVE

## 2015-08-24 LAB — T-HELPER CELL (CD4) - (RCID CLINIC ONLY)
CD4 % Helper T Cell: 4 % — ABNORMAL LOW (ref 33–55)
CD4 T CELL ABS: 70 /uL — AB (ref 400–2700)

## 2015-08-24 LAB — HIV-1 RNA QUANT-NO REFLEX-BLD
HIV 1 RNA Quant: 41014 copies/mL — ABNORMAL HIGH (ref <20)
HIV-1 RNA Quant, Log: 4.61 {Log} — ABNORMAL HIGH (ref <1.30)

## 2015-09-11 ENCOUNTER — Other Ambulatory Visit: Payer: Self-pay | Admitting: Infectious Diseases

## 2015-09-25 ENCOUNTER — Other Ambulatory Visit: Payer: Self-pay | Admitting: Infectious Diseases

## 2015-09-26 ENCOUNTER — Other Ambulatory Visit: Payer: Self-pay | Admitting: *Deleted

## 2015-09-26 DIAGNOSIS — B20 Human immunodeficiency virus [HIV] disease: Secondary | ICD-10-CM

## 2015-09-26 MED ORDER — ABACAVIR-DOLUTEGRAVIR-LAMIVUD 600-50-300 MG PO TABS: 1.0000 | ORAL_TABLET | Freq: Every day | ORAL | Status: DC

## 2015-09-26 MED ORDER — TENOFOVIR DISOPROXIL FUMARATE 300 MG PO TABS: 300.0000 mg | ORAL_TABLET | Freq: Every day | ORAL | Status: DC

## 2015-10-05 ENCOUNTER — Ambulatory Visit (INDEPENDENT_AMBULATORY_CARE_PROVIDER_SITE_OTHER): Payer: Self-pay | Admitting: Infectious Diseases

## 2015-10-05 VITALS — BP 150/97 | HR 78 | Wt 226.0 lb

## 2015-10-05 DIAGNOSIS — B182 Chronic viral hepatitis C: Secondary | ICD-10-CM

## 2015-10-05 DIAGNOSIS — F329 Major depressive disorder, single episode, unspecified: Secondary | ICD-10-CM

## 2015-10-05 DIAGNOSIS — Z23 Encounter for immunization: Secondary | ICD-10-CM

## 2015-10-05 DIAGNOSIS — F32A Depression, unspecified: Secondary | ICD-10-CM

## 2015-10-05 DIAGNOSIS — K047 Periapical abscess without sinus: Secondary | ICD-10-CM

## 2015-10-05 DIAGNOSIS — B20 Human immunodeficiency virus [HIV] disease: Secondary | ICD-10-CM

## 2015-10-05 LAB — COMPREHENSIVE METABOLIC PANEL
ALT: 70 U/L — ABNORMAL HIGH (ref 9–46)
AST: 54 U/L — ABNORMAL HIGH (ref 10–40)
Albumin: 4.3 g/dL (ref 3.6–5.1)
Alkaline Phosphatase: 62 U/L (ref 40–115)
BUN: 8 mg/dL (ref 7–25)
CALCIUM: 9.5 mg/dL (ref 8.6–10.3)
CHLORIDE: 106 mmol/L (ref 98–110)
CO2: 25 mmol/L (ref 20–31)
Creat: 0.66 mg/dL (ref 0.60–1.35)
GLUCOSE: 91 mg/dL (ref 65–99)
POTASSIUM: 3.3 mmol/L — AB (ref 3.5–5.3)
Sodium: 142 mmol/L (ref 135–146)
Total Bilirubin: 0.6 mg/dL (ref 0.2–1.2)
Total Protein: 7.4 g/dL (ref 6.1–8.1)

## 2015-10-05 LAB — IRON: Iron: 130 ug/dL (ref 50–180)

## 2015-10-05 MED ORDER — ELVITEG-COBIC-EMTRICIT-TENOFAF 150-150-200-10 MG PO TABS: 1.0000 | ORAL_TABLET | Freq: Every day | ORAL | Status: DC

## 2015-10-05 MED ORDER — DARUNAVIR ETHANOLATE 800 MG PO TABS: 800.0000 mg | ORAL_TABLET | Freq: Every day | ORAL | Status: DC

## 2015-10-05 NOTE — Assessment & Plan Note (Signed)
Will change him to DRV/genvoya Will have him meet with counselor.  Needs adherence counselor rtc in 1 month.

## 2015-10-05 NOTE — Progress Notes (Signed)
   Subjective:    Patient ID: [name], male    DOB: 08/30/1966, 49 y.o.   MRN: [id number]  HPI 49 yo m with HIV+ , previously on triumeq/tfv. Also taking lisinopril for HTN.  Has been taking his meds irregularly.  Has been depressed since his mother died of pancreatic ca in August.  Has been going to [place]emetary 4 days a week. Has been sleeping irregularly. Had bereavement from hospice but he did not like.   HIV 1 RNA QUANT (copies/mL)  Date Value  08/22/2015 41014*  04/04/2014 52955*  10/06/2013 8177*   CD4 T CELL ABS (/uL)  Date Value  08/22/2015 70*  04/04/2014 240*  10/06/2013 340*    Review of Systems  Constitutional: Positive for appetite change and unexpected weight change.  Gastrointestinal: Positive for abdominal pain. Negative for diarrhea and constipation.  Genitourinary: Negative for difficulty urinating.  Psychiatric/Behavioral: Positive for sleep disturbance and dysphoric mood.  ? hearburn    Objective:   Physical Exam  Constitutional: He appears well-developed and well-nourished.  HENT:  Mouth/Throat: No oropharyngeal exudate.  Eyes: EOM are normal. Pupils are equal, round, and reactive to light.  Neck: Neck supple.  Cardiovascular: Normal rate, regular rhythm and normal heart sounds.   Pulmonary/Chest: Effort normal and breath sounds normal.  Abdominal: Soft. Bowel sounds are normal. There is no tenderness.  Lymphadenopathy:    He has no cervical adenopathy.  Psychiatric: He exhibits a depressed mood. He expresses no suicidal plans.      Assessment & Plan:

## 2015-10-05 NOTE — Assessment & Plan Note (Signed)
Get him in with Loni BeckwithJodi, Ambre'

## 2015-10-05 NOTE — Assessment & Plan Note (Signed)
Stage him for treatment

## 2015-10-05 NOTE — Assessment & Plan Note (Signed)
Poor denttion Get him in with dental

## 2015-10-06 LAB — PROTIME-INR
INR: 1.12 (ref <1.50)
PROTHROMBIN TIME: 14.5 s (ref 11.6–15.2)

## 2015-10-08 LAB — HEPATITIS C RNA QUANTITATIVE
HCV Quantitative Log: 6.43 {Log} — ABNORMAL HIGH (ref <1.18)
HCV Quantitative: 2685134 IU/mL — ABNORMAL HIGH (ref <15)

## 2015-10-08 LAB — HIV-1 RNA ULTRAQUANT REFLEX TO GENTYP+
HIV 1 RNA Quant: 10623 copies/mL — ABNORMAL HIGH (ref <20)
HIV-1 RNA QUANT, LOG: 4.03 {Log_copies}/mL — AB (ref <1.30)

## 2015-10-09 LAB — ANTI-NUCLEAR AB-TITER (ANA TITER): ANA Titer 1: 1:80 {titer} — ABNORMAL HIGH

## 2015-10-09 LAB — ANA: ANA: POSITIVE — AB

## 2015-10-10 ENCOUNTER — Telehealth: Payer: Self-pay | Admitting: *Deleted

## 2015-10-10 DIAGNOSIS — E876 Hypokalemia: Secondary | ICD-10-CM

## 2015-10-10 MED ORDER — POTASSIUM CHLORIDE ER 10 MEQ PO TBCR: 10.0000 meq | EXTENDED_RELEASE_TABLET | Freq: Once | ORAL | Status: DC

## 2015-10-10 NOTE — Telephone Encounter (Signed)
Spoke with patient. He will take the potassium today, will come tomorrow for labs. He is interested in speaking with a counselor due to increased stressors.  He declined to make an appointment, will "just pop in" during flex days. Andree CossHowell, Michelle M, RN

## 2015-10-10 NOTE — Telephone Encounter (Signed)
-----   Message from Ginnie SmartJeffrey C Hatcher, MD sent at 10/09/2015  6:01 PM EST ----- Can you call Mr [name] and give him 40meq KCL Repeat his serum potassium the following day?  thanks ----- Message -----    From: [facility]    Sent: 10/05/2015  11:11 PM      To: Ginnie SmartJeffrey C Hatcher, MD

## 2015-10-10 NOTE — Telephone Encounter (Signed)
No answer, no voicemail.  Will attempt again later today. Will send prescription with instructions to pharmacy. Should this be a recurrent prescription? Andree CossHowell, Jurni Cesaro M, RN

## 2015-10-11 ENCOUNTER — Other Ambulatory Visit: Payer: Self-pay

## 2015-10-11 LAB — HEPATITIS C GENOTYPE

## 2015-10-13 LAB — HIV-1 INTEGRASE GENOTYPE

## 2015-10-18 LAB — HIV-1 GENOTYPR PLUS

## 2015-10-26 ENCOUNTER — Other Ambulatory Visit: Payer: Self-pay | Admitting: Infectious Diseases

## 2015-10-26 ENCOUNTER — Ambulatory Visit (HOSPITAL_COMMUNITY): Payer: Self-pay

## 2015-12-07 ENCOUNTER — Ambulatory Visit: Payer: Self-pay

## 2015-12-07 ENCOUNTER — Other Ambulatory Visit: Payer: Self-pay

## 2016-01-04 ENCOUNTER — Ambulatory Visit: Payer: Self-pay | Admitting: Internal Medicine

## 2016-01-04 ENCOUNTER — Other Ambulatory Visit: Payer: Self-pay

## 2016-01-04 ENCOUNTER — Ambulatory Visit: Payer: Self-pay

## 2016-01-11 ENCOUNTER — Other Ambulatory Visit: Payer: Self-pay

## 2016-01-11 ENCOUNTER — Ambulatory Visit: Payer: Self-pay

## 2016-01-15 ENCOUNTER — Ambulatory Visit: Payer: Self-pay

## 2016-01-15 ENCOUNTER — Other Ambulatory Visit (INDEPENDENT_AMBULATORY_CARE_PROVIDER_SITE_OTHER): Payer: Self-pay

## 2016-01-15 DIAGNOSIS — B20 Human immunodeficiency virus [HIV] disease: Secondary | ICD-10-CM

## 2016-01-15 LAB — CBC WITH DIFFERENTIAL/PLATELET
BASOS PCT: 0 % (ref 0–1)
Basophils Absolute: 0 10*3/uL (ref 0.0–0.1)
EOS PCT: 1 % (ref 0–5)
Eosinophils Absolute: 0.1 10*3/uL (ref 0.0–0.7)
HCT: 42.8 % (ref 39.0–52.0)
Hemoglobin: 15.2 g/dL (ref 13.0–17.0)
Lymphocytes Relative: 53 % — ABNORMAL HIGH (ref 12–46)
Lymphs Abs: 2.8 10*3/uL (ref 0.7–4.0)
MCH: 35.3 pg — ABNORMAL HIGH (ref 26.0–34.0)
MCHC: 35.5 g/dL (ref 30.0–36.0)
MCV: 99.3 fL (ref 78.0–100.0)
MONO ABS: 0.6 10*3/uL (ref 0.1–1.0)
MPV: 8.8 fL (ref 8.6–12.4)
Monocytes Relative: 11 % (ref 3–12)
Neutro Abs: 1.9 10*3/uL (ref 1.7–7.7)
Neutrophils Relative %: 35 % — ABNORMAL LOW (ref 43–77)
Platelets: 232 10*3/uL (ref 150–400)
RBC: 4.31 MIL/uL (ref 4.22–5.81)
RDW: 13.7 % (ref 11.5–15.5)
WBC: 5.3 10*3/uL (ref 4.0–10.5)

## 2016-01-15 LAB — COMPREHENSIVE METABOLIC PANEL
ALT: 51 U/L — AB (ref 9–46)
AST: 39 U/L (ref 10–40)
Albumin: 4.1 g/dL (ref 3.6–5.1)
Alkaline Phosphatase: 61 U/L (ref 40–115)
BUN: 11 mg/dL (ref 7–25)
CALCIUM: 9.3 mg/dL (ref 8.6–10.3)
CO2: 25 mmol/L (ref 20–31)
Chloride: 105 mmol/L (ref 98–110)
Creat: 0.93 mg/dL (ref 0.60–1.35)
Glucose, Bld: 85 mg/dL (ref 65–99)
POTASSIUM: 3.7 mmol/L (ref 3.5–5.3)
SODIUM: 140 mmol/L (ref 135–146)
TOTAL PROTEIN: 7.6 g/dL (ref 6.1–8.1)
Total Bilirubin: 0.5 mg/dL (ref 0.2–1.2)

## 2016-01-16 LAB — HIV-1 RNA QUANT-NO REFLEX-BLD
HIV 1 RNA QUANT: 72198 {copies}/mL — AB (ref <20)
HIV-1 RNA Quant, Log: 4.86 Log copies/mL — ABNORMAL HIGH (ref <1.30)

## 2016-01-16 LAB — T-HELPER CELL (CD4) - (RCID CLINIC ONLY)
CD4 % Helper T Cell: 5 % — ABNORMAL LOW (ref 33–55)
CD4 T Cell Abs: 160 /uL — ABNORMAL LOW (ref 400–2700)

## 2016-01-31 ENCOUNTER — Ambulatory Visit (INDEPENDENT_AMBULATORY_CARE_PROVIDER_SITE_OTHER): Payer: Self-pay | Admitting: Infectious Diseases

## 2016-01-31 ENCOUNTER — Ambulatory Visit: Payer: Self-pay | Admitting: *Deleted

## 2016-01-31 ENCOUNTER — Encounter: Payer: Self-pay | Admitting: Infectious Diseases

## 2016-01-31 VITALS — HR 78 | Temp 98.2°F | Wt 226.0 lb

## 2016-01-31 DIAGNOSIS — F4321 Adjustment disorder with depressed mood: Secondary | ICD-10-CM

## 2016-01-31 DIAGNOSIS — B20 Human immunodeficiency virus [HIV] disease: Secondary | ICD-10-CM

## 2016-01-31 DIAGNOSIS — F329 Major depressive disorder, single episode, unspecified: Secondary | ICD-10-CM

## 2016-01-31 DIAGNOSIS — B182 Chronic viral hepatitis C: Secondary | ICD-10-CM

## 2016-01-31 DIAGNOSIS — F32A Depression, unspecified: Secondary | ICD-10-CM

## 2016-01-31 DIAGNOSIS — Z634 Disappearance and death of family member: Secondary | ICD-10-CM

## 2016-01-31 DIAGNOSIS — F4329 Adjustment disorder with other symptoms: Secondary | ICD-10-CM | POA: Insufficient documentation

## 2016-01-31 NOTE — BH Specialist Note (Signed)
Counselor met with [name] in the exam room today for a history of depression.  Patient was oriented times four with good affect and dress.  Patient was alert and friendly.  Counselor inquired with patient as to how he was doing.  Patient shared that he was doing ok and had both good and bad moments.  Counselor provided support and encouragement accordingly.  Patient indicated that he wasn't sure if he needed counseling services or not.  Counselor recommended that patient seek services if and when he determines he does have a need. Patient agreed that he would take counselor's business card and keep it for potential future reference.   Rolena Infante, MA [facility]

## 2016-01-31 NOTE — Assessment & Plan Note (Signed)
He still has detectable virus, has been poorly adherent.  Will check his VL, genotype today.  rtc in 3 months.

## 2016-01-31 NOTE — Assessment & Plan Note (Signed)
Will defer treatment until we can get his HIV under control.  Non-adherence here would be disastrous.

## 2016-01-31 NOTE — Assessment & Plan Note (Signed)
Will get him in with counselor.  rtc 3 months.

## 2016-01-31 NOTE — Progress Notes (Signed)
   Subjective:    Patient ID: [name], male    DOB: 06/14/1966, 50 y.o.   MRN: [id number]  HPI 50 yo m with HIV+ , previously on triumeq/tfv ---> genvoya/drv December 2016. Also taking lisinopril for HTN.  Has been depressed since his mother died of pancreatic ca in August 2016. He is still grieving this.. He has not been seeing counselor here. Has not connected with hospice either (they sent him a letter).  His siblings have been fighting him over control of his house which is also stressful.  Has been feeling well.  Has had some loose BM with new art.  Missing weekly.  Has Hep C as well - 1a VL 2.7 million (09-2015).   HIV 1 RNA QUANT (copies/mL)  Date Value  01/15/2016 72198*  10/05/2015 10623*  08/22/2015 41014*   CD4 T CELL ABS (/uL)  Date Value  01/15/2016 160*  08/22/2015 70*  04/04/2014 240*    Review of Systems  Constitutional: Negative for appetite change and unexpected weight change.  Gastrointestinal: Negative for diarrhea and constipation.  Genitourinary: Negative for difficulty urinating.  Psychiatric/Behavioral: Positive for sleep disturbance and dysphoric mood.       Objective:   Physical Exam  Constitutional: He appears well-developed and well-nourished.  HENT:  Mouth/Throat: No oropharyngeal exudate.  Eyes: EOM are normal. Pupils are equal, round, and reactive to light.  Neck: Neck supple.  Cardiovascular: Normal rate, regular rhythm and normal heart sounds.   Pulmonary/Chest: Effort normal and breath sounds normal.  Abdominal: Soft. Bowel sounds are normal. There is no tenderness. There is no rebound.  Lymphadenopathy:    He has no cervical adenopathy.  Psychiatric: His affect is angry. He exhibits a depressed mood.       Assessment & Plan:

## 2016-02-01 LAB — HIV-1 RNA ULTRAQUANT REFLEX TO GENTYP+
HIV 1 RNA QUANT: 27821 {copies}/mL — AB (ref <20)
HIV-1 RNA QUANT, LOG: 4.44 {Log_copies}/mL — AB (ref <1.30)

## 2016-02-08 LAB — HIV-1 INTEGRASE GENOTYPE

## 2016-02-08 LAB — HIV-1 GENOTYPR PLUS

## 2016-02-09 ENCOUNTER — Emergency Department (HOSPITAL_COMMUNITY)
Admission: EM | Admit: 2016-02-09 | Discharge: 2016-02-10 | Disposition: A | Discharge status: Home / Self Care | Payer: Self-pay | Attending: Emergency Medicine | Admitting: Emergency Medicine

## 2016-02-09 ENCOUNTER — Encounter (HOSPITAL_COMMUNITY): Payer: Self-pay | Admitting: Emergency Medicine

## 2016-02-09 DIAGNOSIS — K8020 Calculus of gallbladder without cholecystitis without obstruction: Secondary | ICD-10-CM | POA: Insufficient documentation

## 2016-02-09 DIAGNOSIS — R0602 Shortness of breath: Secondary | ICD-10-CM | POA: Insufficient documentation

## 2016-02-09 DIAGNOSIS — Z8719 Personal history of other diseases of the digestive system: Secondary | ICD-10-CM | POA: Insufficient documentation

## 2016-02-09 DIAGNOSIS — R1011 Right upper quadrant pain: Secondary | ICD-10-CM

## 2016-02-09 DIAGNOSIS — Z8619 Personal history of other infectious and parasitic diseases: Secondary | ICD-10-CM | POA: Insufficient documentation

## 2016-02-09 DIAGNOSIS — Z872 Personal history of diseases of the skin and subcutaneous tissue: Secondary | ICD-10-CM | POA: Insufficient documentation

## 2016-02-09 DIAGNOSIS — Z79899 Other long term (current) drug therapy: Secondary | ICD-10-CM | POA: Insufficient documentation

## 2016-02-09 DIAGNOSIS — Z8669 Personal history of other diseases of the nervous system and sense organs: Secondary | ICD-10-CM | POA: Insufficient documentation

## 2016-02-09 DIAGNOSIS — Z8639 Personal history of other endocrine, nutritional and metabolic disease: Secondary | ICD-10-CM | POA: Insufficient documentation

## 2016-02-09 DIAGNOSIS — I10 Essential (primary) hypertension: Secondary | ICD-10-CM | POA: Insufficient documentation

## 2016-02-09 DIAGNOSIS — B20 Human immunodeficiency virus [HIV] disease: Secondary | ICD-10-CM | POA: Insufficient documentation

## 2016-02-09 LAB — URINALYSIS, ROUTINE W REFLEX MICROSCOPIC
GLUCOSE, UA: NEGATIVE mg/dL
HGB URINE DIPSTICK: NEGATIVE
Ketones, ur: NEGATIVE mg/dL
Leukocytes, UA: NEGATIVE
Nitrite: NEGATIVE
Protein, ur: NEGATIVE mg/dL
SPECIFIC GRAVITY, URINE: 1.03 (ref 1.005–1.030)
pH: 6 (ref 5.0–8.0)

## 2016-02-09 NOTE — ED Notes (Signed)
Pt states this morning he ate macaroni and cheese and after he c/o epigastric pain. Pt states he also felt just generally nervous and jittery. Pt denies N/V/D.

## 2016-02-10 ENCOUNTER — Emergency Department (HOSPITAL_COMMUNITY): Payer: Self-pay

## 2016-02-10 LAB — CBC
HCT: 39 % (ref 39.0–52.0)
HEMOGLOBIN: 14.1 g/dL (ref 13.0–17.0)
MCH: 35.2 pg — ABNORMAL HIGH (ref 26.0–34.0)
MCHC: 36.2 g/dL — ABNORMAL HIGH (ref 30.0–36.0)
MCV: 97.3 fL (ref 78.0–100.0)
PLATELETS: 186 10*3/uL (ref 150–400)
RBC: 4.01 MIL/uL — AB (ref 4.22–5.81)
RDW: 13.2 % (ref 11.5–15.5)
WBC: 5.6 10*3/uL (ref 4.0–10.5)

## 2016-02-10 LAB — COMPREHENSIVE METABOLIC PANEL
ALK PHOS: 66 U/L (ref 38–126)
ALT: 61 U/L (ref 17–63)
ANION GAP: 11 (ref 5–15)
AST: 54 U/L — ABNORMAL HIGH (ref 15–41)
Albumin: 4.1 g/dL (ref 3.5–5.0)
BUN: 10 mg/dL (ref 6–20)
CALCIUM: 9.3 mg/dL (ref 8.9–10.3)
CO2: 22 mmol/L (ref 22–32)
CREATININE: 0.7 mg/dL (ref 0.61–1.24)
Chloride: 106 mmol/L (ref 101–111)
Glucose, Bld: 107 mg/dL — ABNORMAL HIGH (ref 65–99)
Potassium: 3.6 mmol/L (ref 3.5–5.1)
SODIUM: 139 mmol/L (ref 135–145)
TOTAL PROTEIN: 8.1 g/dL (ref 6.5–8.1)
Total Bilirubin: 0.6 mg/dL (ref 0.3–1.2)

## 2016-02-10 LAB — LIPASE, BLOOD: Lipase: 33 U/L (ref 11–51)

## 2016-02-10 LAB — TROPONIN I

## 2016-02-10 MED ORDER — OXYCODONE-ACETAMINOPHEN 5-325 MG PO TABS: 1.0000 | ORAL_TABLET | Freq: Four times a day (QID) | ORAL | Status: DC | PRN

## 2016-02-10 MED ORDER — PROMETHAZINE HCL 25 MG PO TABS: 25.0000 mg | ORAL_TABLET | Freq: Four times a day (QID) | ORAL | Status: DC | PRN

## 2016-02-10 NOTE — ED Provider Notes (Signed)
By signing my name below, I, Evon Slackerrance Branch, attest that this documentation has been prepared under the direction and in the presence of Enbridge EnergyKristen N Clayvon Parlett, DO. Electronically Signed: Evon Slackerrance Branch, ED Scribe. 02/10/2016. 1:04 AM.  TIME SEEN: 1:04 AM   CHIEF COMPLAINT: Abdominal Pain  HPI: HPI Comments: [name] is a 50 y.o. male with history of HIV (last CD4 160, viral load greater than 27,000 currently on HAART), hepatitis C who presents to the Emergency Department complaining of resolved upper abdominal pain onset 12 hours PTA. Pt reports that the pain was so severe it caused him to feel SOB. Pt states that the pain began after eating macaroni and cheese. Pt reports relief with ibuprofen. Currently pain-free. Pt denies n/v/d or blood in stool. Denies hx of abdominal pain. He states that his mother has a HX of gallstones. Denies heavy alcohol use or NSAID use. Has never had similar symptoms. Denies any fever.   ROS: See HPI Constitutional: no fever  Eyes: no drainage  ENT: no runny nose   Cardiovascular:  no chest pain  Resp: SOB  GI: no vomiting GU: no dysuria Integumentary: no rash  Allergy: no hives  Musculoskeletal: no leg swelling  Neurological: no slurred speech ROS otherwise negative  PAST MEDICAL HISTORY/PAST SURGICAL HISTORY:  Past Medical History  Diagnosis Date  . HIV disease (HCC)   . Hepatitis C   . Venereal wart   . Candidiasis   . Hypokalemia   . Plantar nerve lesion   . Retinitis   . Hypertension   . Allergy   . Rectal bleeding   . Ulcer   . Syncope   . Abnormal finding on GI tract imaging   . Herpes iris     MEDICATIONS:  Prior to Admission medications   Medication Sig Start Date End Date Taking? Authorizing Provider  Darunavir Ethanolate (PREZISTA) 800 MG tablet Take 1 tablet (800 mg total) by mouth daily with breakfast. 10/05/15   Ginnie SmartJeffrey C Hatcher, MD  elvitegravir-cobicistat-emtricitabine-tenofovir (GENVOYA) 150-150-200-10 MG TABS tablet  Take 1 tablet by mouth daily with breakfast. 10/05/15   Ginnie SmartJeffrey C Hatcher, MD  ibuprofen (ADVIL,MOTRIN) 200 MG tablet Take 400 mg by mouth every 6 (six) hours as needed for mild pain.    Historical Provider, MD  lisinopril (PRINIVIL,ZESTRIL) 40 MG tablet TAKE 1 TABLET BY MOUTH DAILY 10/26/15   Ginnie SmartJeffrey C Hatcher, MD  potassium chloride (K-DUR) 10 MEQ tablet Take 1 tablet (10 mEq total) by mouth once. Repeat labs at [facility] the next day Patient not taking: Reported on 01/31/2016 10/10/15   Ginnie SmartJeffrey C Hatcher, MD  valACYclovir (VALTREX) 1000 MG tablet TAKE 1 TABLET BY MOUTH TWICE DAILY 10/26/15   Ginnie SmartJeffrey C Hatcher, MD    ALLERGIES:  Allergies  Allergen Reactions  . Apple Swelling    SOCIAL HISTORY:  Social History  Substance Use Topics  . Smoking status: Never Smoker   . Smokeless tobacco: Never Used  . Alcohol Use: No    FAMILY HISTORY: Family History  Problem Relation Age of Onset  . Hypertension Sister   . Hypertension Brother   . Cancer Mother     pancreatic cancer    EXAM: BP 154/97 mmHg  Pulse 85  Temp(Src) 98.9 F (37.2 C) (Oral)  Resp 18  SpO2 97% CONSTITUTIONAL: Alert and oriented and responds appropriately to questions. Well-appearing; well-nourished, afebrile, smiling, laughing HEAD: Normocephalic EYES: Conjunctivae clear, PERRL ENT: normal nose; no rhinorrhea; moist mucous membranes NECK: Supple, no meningismus, no LAD  CARD: RRR; S1 and S2 appreciated; no murmurs, no clicks, no rubs, no gallops RESP: Normal chest excursion without splinting or tachypnea; breath sounds clear and equal bilaterally; no wheezes, no rhonchi, no rales, no hypoxia or respiratory distress, speaking full sentences ABD/GI: Normal bowel sounds; non-distended; soft, non-tender, no rebound, no guarding, no peritoneal signs, negative murphy's sign. No tenderness at McBurney's point. BACK:  The back appears normal and is non-tender to palpation, there is no CVA tenderness EXT: Normal ROM in all  joints; non-tender to palpation; no edema; normal capillary refill; no cyanosis, no calf tenderness or swelling    SKIN: Normal color for age and race; warm; no rash NEURO: Moves all extremities equally, sensation to light touch intact diffusely, cranial nerves II through XII intact PSYCH: The patient's mood and manner are appropriate. Grooming and personal hygiene are appropriate.  MEDICAL DECISION MAKING: Patient here with right upper quadrant abdominal pain that has now completely resolved. Differential includes cholelithiasis, cholecystitis, pancreatitis, gastritis. We'll obtain labs, right upper quadrant abdominal ultrasound. We'll also obtain chest x-ray given he reported mild shortness of breath. Completely asymptomatic currently and hemodynamically stable.  ED PROGRESS: Patient's labs are unremarkable other than minimal elevation of his AST. Troponin is negative. Lipase normal. Chest x-ray clear. CT scan shows cholelithiasis with mild gallbladder wall thickening and pericholecystic edema.  He had a negative Murphy sign. Ultrasound indeterminate for cholecystitis. Patient is still completely pain-free with benign exam. He still afebrile. We'll discuss with general surgery on call. Patient states that he feels comfortable with plan to go home for outpatient follow-up. He states that he has never had problems with his gallbladder before today.   2:30 AM  D/w Dr. Dwain Sarna with general surgery. Discussed patient's case. He agrees that this does not sound clinically like acute cholecystitis. Both myself, Dr. Dwain Sarna and the patient also comfortable with the plan for discharge home. We'll discharge with pain and nausea medicine. Have discussed at length return precautions including fevers, worsening pain, vomiting that will not stop, blood in his stool or black and tarry stools. Will provide him with outpatient surgery follow-up information. Have advised him to stick to a low fat diet.   At this  time, I do not feel there is any life-threatening condition present.  I have reviewed and discussed all results (EKG, imaging, lab, urine as appropriate), exam findings with patient. I have reviewed nursing notes and appropriate previous records.  I feel the patient is safe to be discharged home without further emergent workup. Discussed usual and customary return precautions. Patient and family (if present) verbalize understanding and are comfortable with this plan.  Patient will follow-up with their primary care provider. If they do not have a primary care provider, information for follow-up has been provided to them. All questions have been answered.      EKG Interpretation  Date/Time:  Saturday February 10 2016 01:10:55 EDT Ventricular Rate:  69 PR Interval:  173 QRS Duration: 95 QT Interval:  422 QTC Calculation: 452 R Axis:   44 Text Interpretation:  Sinus rhythm Borderline T wave abnormalities No significant change since last tracing Confirmed by Naven Giambalvo,  DO, Halli Equihua ([id number]) on 02/10/2016 1:29:00 AM      I personally performed the services described in this documentation, which was scribed in my presence. The recorded information has been reviewed and is accurate.    Layla Maw Brett Darko, DO 02/10/16 [phone]

## 2016-02-10 NOTE — Discharge Instructions (Signed)
Cholelithiasis °Cholelithiasis (also called gallstones) is a form of gallbladder disease in which gallstones form in your gallbladder. The gallbladder is an organ that stores bile made in the liver, which helps digest fats. Gallstones begin as small crystals and slowly grow into stones. Gallstone pain occurs when the gallbladder spasms and a gallstone is blocking the duct. Pain can also occur when a stone passes out of the duct.  °RISK FACTORS °· Being male.   °· Having multiple pregnancies. Health care providers sometimes advise removing diseased gallbladders before future pregnancies.   °· Being obese. °· Eating a diet heavy in fried foods and fat.   °· Being older than 60 years and increasing age.   °· Prolonged use of medicines containing male hormones.   °· Having diabetes mellitus.   °· Rapidly losing weight.   °· Having a family history of gallstones (heredity).   °SYMPTOMS °· Nausea.   °· Vomiting. °· Abdominal pain.   °· Yellowing of the skin (jaundice).   °· Sudden pain. It may persist from several minutes to several hours. °· Fever.   °· Tenderness to the touch.  °In some cases, when gallstones do not move into the bile duct, people have no pain or symptoms. These are called "silent" gallstones.  °TREATMENT °Silent gallstones do not need treatment. In severe cases, emergency surgery may be required. Options for treatment include: °· Surgery to remove the gallbladder. This is the most common treatment. °· Medicines. These do not always work and may take 6-12 months or more to work. °· Shock wave treatment (extracorporeal biliary lithotripsy). In this treatment an ultrasound machine sends shock waves to the gallbladder to break gallstones into smaller pieces that can pass into the intestines or be dissolved by medicine. °HOME CARE INSTRUCTIONS  °· Only take over-the-counter or prescription medicines for pain, discomfort, or fever as directed by your health care provider.   °· Follow a low-fat diet until  seen again by your health care provider. Fat causes the gallbladder to contract, which can result in pain.   °· Follow up with your health care provider as directed. Attacks are almost always recurrent and surgery is usually required for permanent treatment.   °SEEK IMMEDIATE MEDICAL CARE IF:  °· Your pain increases and is not controlled by medicines.   °· You have a fever or persistent symptoms for more than 2-3 days.   °· You have a fever and your symptoms suddenly get worse.   °· You have persistent nausea and vomiting.   °MAKE SURE YOU:  °· Understand these instructions. °· Will watch your condition. °· Will get help right away if you are not doing well or get worse. °  °This information is not intended to replace advice given to you by your health care provider. Make sure you discuss any questions you have with your health care provider. °  °Document Released: 10/31/2005 Document Revised: 07/07/2013 Document Reviewed: 04/28/2013 °Elsevier Interactive Patient Education ©2016 Elsevier Inc[employer] °Low-Fat Diet for Pancreatitis or Gallbladder Conditions °A low-fat diet can be helpful if you have pancreatitis or a gallbladder condition. With these conditions, your pancreas and gallbladder have trouble digesting fats. A healthy eating plan with less fat will help rest your pancreas and gallbladder and reduce your symptoms. °WHAT DO I NEED TO KNOW ABOUT THIS DIET? °· Eat a low-fat diet. °¨ Reduce your fat intake to less than 20-30% of your total daily calories. This is less than 50-60 g of fat per day. °¨ Remember that you need some fat in your diet. Ask your dietician what your daily goal should be. °¨ Choose   nonfat and low-fat healthy foods. Look for the words "nonfat," "low fat," or "fat free." °¨ As a guide, look on the label and choose foods with less than 3 g of fat per serving. Eat only one serving. °· Avoid alcohol. °· Do not smoke. If you need help quitting, talk with your health care provider. °· Eat small  frequent meals instead of three large heavy meals. °WHAT FOODS CAN I EAT? °Grains °Include healthy grains and starches such as potatoes, wheat bread, fiber-rich cereal, and brown rice. Choose whole grain options whenever possible. In adults, whole grains should account for 45-65% of your daily calories.  °Fruits and Vegetables °Eat plenty of fruits and vegetables. Fresh fruits and vegetables add fiber to your diet. °Meats and Other Protein Sources °Eat lean meat such as chicken and pork. Trim any fat off of meat before cooking it. Eggs, fish, and beans are other sources of protein. In adults, these foods should account for 10-35% of your daily calories. °Dairy °Choose low-fat milk and dairy options. Dairy includes fat and protein, as well as calcium.  °Fats and Oils °Limit high-fat foods such as fried foods, sweets, baked goods, sugary drinks.  °Other °Creamy sauces and condiments, such as mayonnaise, can add extra fat. Think about whether or not you need to use them, or use smaller amounts or low fat options. °WHAT FOODS ARE NOT RECOMMENDED? °· High fat foods, such as: °¨ Baked goods. °¨ Ice cream. °¨ French toast. °¨ Sweet rolls. °¨ Pizza. °¨ Cheese bread. °¨ Foods covered with batter, butter, creamy sauces, or cheese. °¨ Fried foods. °¨ Sugary drinks and desserts. °· Foods that cause gas or bloating °  °This information is not intended to replace advice given to you by your health care provider. Make sure you discuss any questions you have with your health care provider. °  °Document Released: 11/09/2013 Document Reviewed: 11/09/2013 °Elsevier Interactive Patient Education ©2016 Elsevier Inc. ° °

## 2016-03-01 ENCOUNTER — Other Ambulatory Visit: Payer: Self-pay | Admitting: Infectious Diseases

## 2016-05-02 ENCOUNTER — Ambulatory Visit: Payer: Self-pay | Admitting: Infectious Diseases

## 2016-07-09 ENCOUNTER — Encounter: Payer: Self-pay | Admitting: *Deleted

## 2016-07-09 NOTE — Telephone Encounter (Signed)
Error

## 2016-07-29 ENCOUNTER — Ambulatory Visit: Payer: Self-pay

## 2016-08-01 ENCOUNTER — Telehealth: Payer: Self-pay

## 2016-08-01 ENCOUNTER — Ambulatory Visit: Payer: Self-pay

## 2016-08-01 NOTE — Telephone Encounter (Signed)
Patient walked into the clinic with a summons for jury duty and was wanting Dr. 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 Patient stated five years ago Dr. Ninetta LightsHatcher explained his condition to the court and he did not have to serve. This time he said they were requesting a written letter. Explained to patient message will be forward to Dr. Ninetta LightsHatcher for consideration this time and he will be contacted with response. Copy of jury form made and put into accordion folder in triage, in case Dr. Ninetta LightsHatcher agrees. Patient stated understanding. Rejeana Brockandace Derion Kreiter, LPN

## 2016-08-02 ENCOUNTER — Encounter: Payer: Self-pay | Admitting: Infectious Diseases

## 2016-08-02 NOTE — Telephone Encounter (Signed)
Letter written in EPIC, please print for pt

## 2016-08-31 ENCOUNTER — Other Ambulatory Visit: Payer: Self-pay | Admitting: Infectious Diseases

## 2016-09-03 ENCOUNTER — Other Ambulatory Visit: Payer: Self-pay | Admitting: Infectious Diseases

## 2016-09-03 DIAGNOSIS — I10 Essential (primary) hypertension: Secondary | ICD-10-CM

## 2016-09-27 IMAGING — DX DG CHEST 2V
2 series · 2 of 2 positions shown · non-contrast
Comparison: Chest radiograph dated 08/14/2009

CLINICAL DATA: 50-year-old male with shortness of breath

EXAM:
CHEST  2 VIEW

[chest pa]
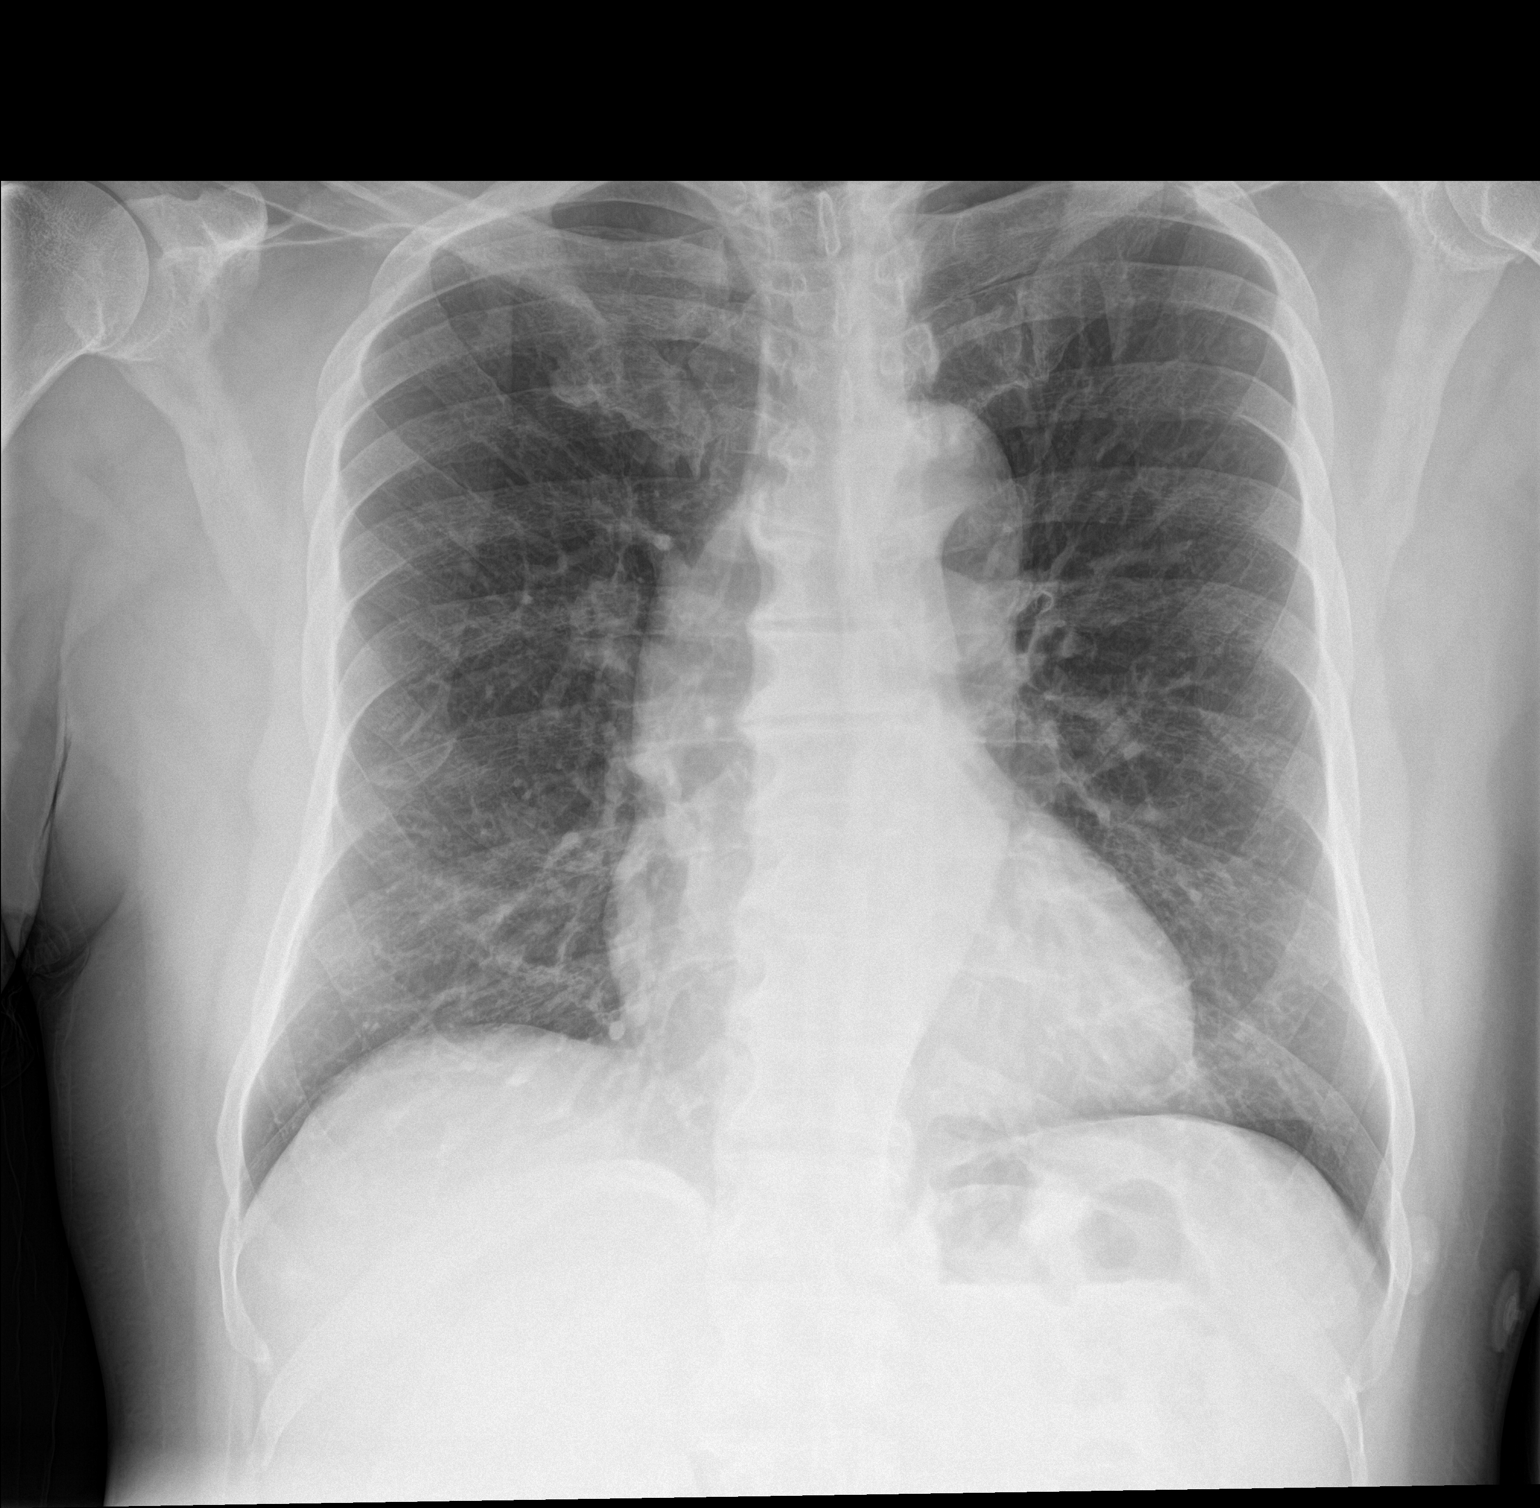

[chest lat]
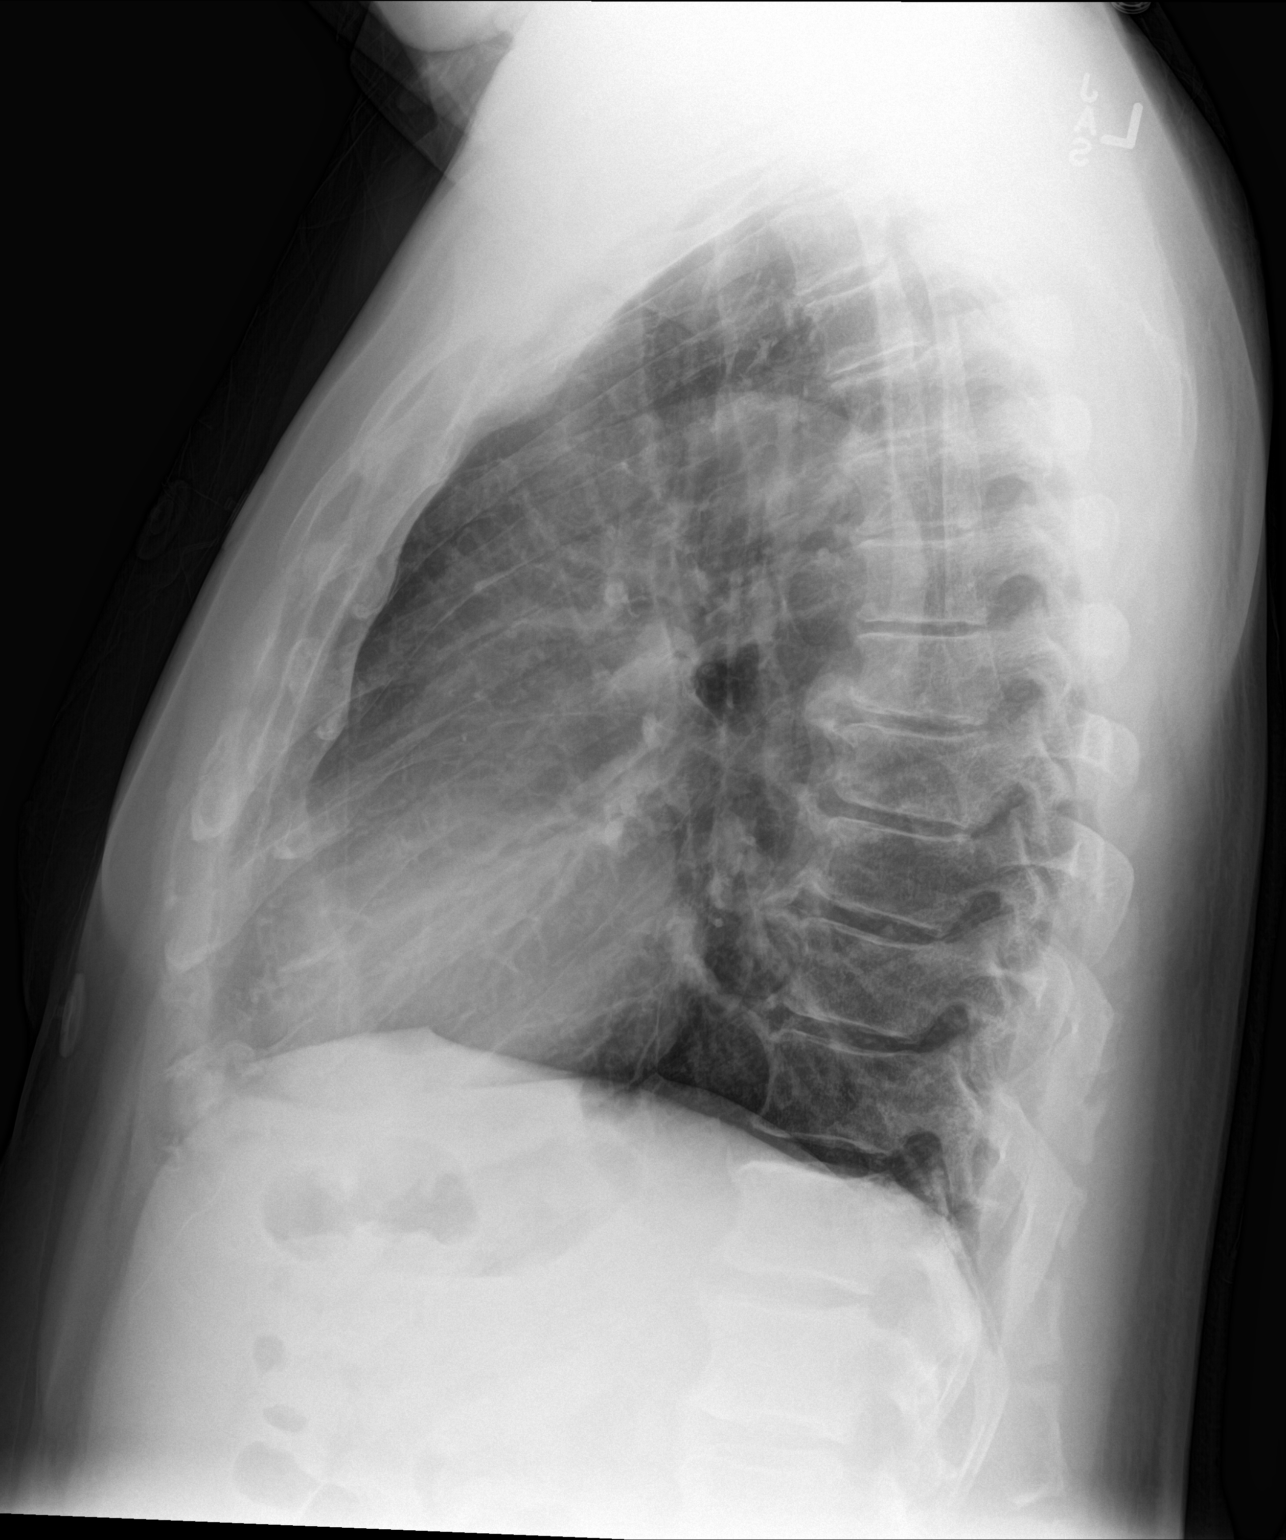

[2 of 2 positions shown; findings below may reference images not displayed]

FINDINGS: The heart size and mediastinal contours are within normal limits.
Both lungs are clear. The visualized skeletal structures are
unremarkable.
IMPRESSION: No active cardiopulmonary disease.

## 2016-11-05 ENCOUNTER — Other Ambulatory Visit: Payer: Self-pay | Admitting: Infectious Diseases

## 2016-11-05 DIAGNOSIS — B20 Human immunodeficiency virus [HIV] disease: Secondary | ICD-10-CM

## 2016-12-19 ENCOUNTER — Ambulatory Visit: Payer: Self-pay

## 2016-12-30 ENCOUNTER — Encounter: Payer: Self-pay | Admitting: Infectious Diseases

## 2017-03-08 ENCOUNTER — Other Ambulatory Visit: Payer: Self-pay | Admitting: Infectious Diseases

## 2017-03-08 ENCOUNTER — Other Ambulatory Visit: Payer: Self-pay | Admitting: Infectious Disease

## 2017-03-08 DIAGNOSIS — B20 Human immunodeficiency virus [HIV] disease: Secondary | ICD-10-CM

## 2017-03-08 DIAGNOSIS — I10 Essential (primary) hypertension: Secondary | ICD-10-CM

## 2017-03-16 ENCOUNTER — Other Ambulatory Visit: Payer: Self-pay | Admitting: Infectious Disease

## 2017-03-16 DIAGNOSIS — I10 Essential (primary) hypertension: Secondary | ICD-10-CM

## 2017-03-18 ENCOUNTER — Other Ambulatory Visit: Payer: Self-pay | Admitting: Infectious Disease

## 2017-03-18 DIAGNOSIS — I10 Essential (primary) hypertension: Secondary | ICD-10-CM

## 2017-03-20 ENCOUNTER — Other Ambulatory Visit: Payer: Self-pay | Admitting: Infectious Diseases

## 2017-03-20 DIAGNOSIS — I10 Essential (primary) hypertension: Secondary | ICD-10-CM

## 2017-04-11 ENCOUNTER — Other Ambulatory Visit: Payer: Self-pay | Admitting: Infectious Diseases

## 2017-04-11 DIAGNOSIS — B20 Human immunodeficiency virus [HIV] disease: Secondary | ICD-10-CM

## 2017-04-11 DIAGNOSIS — I10 Essential (primary) hypertension: Secondary | ICD-10-CM

## 2017-05-16 ENCOUNTER — Other Ambulatory Visit: Payer: Self-pay | Admitting: Infectious Diseases

## 2017-05-16 DIAGNOSIS — B20 Human immunodeficiency virus [HIV] disease: Secondary | ICD-10-CM

## 2017-05-16 DIAGNOSIS — I10 Essential (primary) hypertension: Secondary | ICD-10-CM

## 2017-05-20 ENCOUNTER — Other Ambulatory Visit: Payer: Self-pay | Admitting: Infectious Diseases

## 2017-05-20 DIAGNOSIS — B20 Human immunodeficiency virus [HIV] disease: Secondary | ICD-10-CM

## 2017-05-20 DIAGNOSIS — I10 Essential (primary) hypertension: Secondary | ICD-10-CM

## 2017-05-24 ENCOUNTER — Other Ambulatory Visit: Payer: Self-pay | Admitting: Infectious Diseases

## 2017-05-24 DIAGNOSIS — I10 Essential (primary) hypertension: Secondary | ICD-10-CM

## 2017-05-24 DIAGNOSIS — B20 Human immunodeficiency virus [HIV] disease: Secondary | ICD-10-CM

## 2017-05-28 ENCOUNTER — Ambulatory Visit (INDEPENDENT_AMBULATORY_CARE_PROVIDER_SITE_OTHER): Payer: Self-pay | Admitting: Pharmacist Clinician (PhC)/ Clinical Pharmacy Specialist

## 2017-05-28 DIAGNOSIS — B20 Human immunodeficiency virus [HIV] disease: Secondary | ICD-10-CM

## 2017-05-28 DIAGNOSIS — Z23 Encounter for immunization: Secondary | ICD-10-CM

## 2017-05-28 DIAGNOSIS — I10 Essential (primary) hypertension: Secondary | ICD-10-CM

## 2017-05-28 LAB — COMPLETE METABOLIC PANEL WITH GFR
ALBUMIN: 4.4 g/dL (ref 3.6–5.1)
ALK PHOS: 67 U/L (ref 40–115)
ALT: 56 U/L — AB (ref 9–46)
AST: 34 U/L (ref 10–35)
BILIRUBIN TOTAL: 0.5 mg/dL (ref 0.2–1.2)
BUN: 12 mg/dL (ref 7–25)
CO2: 19 mmol/L — ABNORMAL LOW (ref 20–31)
CREATININE: 0.75 mg/dL (ref 0.70–1.33)
Calcium: 9.4 mg/dL (ref 8.6–10.3)
Chloride: 108 mmol/L (ref 98–110)
GFR, Est African American: >89 mL/min (ref >=60)
GFR, Est Non African American: >89 mL/min (ref >=60)
GLUCOSE: 109 mg/dL — AB (ref 65–99)
Potassium: 3.6 mmol/L (ref 3.5–5.3)
Sodium: 140 mmol/L (ref 135–146)
TOTAL PROTEIN: 7.9 g/dL (ref 6.1–8.1)

## 2017-05-28 LAB — LIPID PANEL
Cholesterol: 156 mg/dL (ref <200)
HDL: 37 mg/dL — ABNORMAL LOW (ref >40)
LDL CALC: 88 mg/dL (ref <100)
Total CHOL/HDL Ratio: 4.2 Ratio (ref <5.0)
Triglycerides: 155 mg/dL — ABNORMAL HIGH (ref <150)
VLDL: 31 mg/dL — AB (ref <30)

## 2017-05-28 MED ORDER — DOLUTEGRAVIR SODIUM 50 MG PO TABS: 50.0000 mg | ORAL_TABLET | Freq: Every day | ORAL | 4 refills | Status: DC

## 2017-05-28 MED ORDER — VALACYCLOVIR HCL 500 MG PO TABS: 500.0000 mg | ORAL_TABLET | Freq: Two times a day (BID) | ORAL | 4 refills | Status: DC

## 2017-05-28 MED ORDER — DARUNAVIR-COBICISTAT 800-150 MG PO TABS: 1.0000 | ORAL_TABLET | Freq: Every day | ORAL | 4 refills | Status: DC

## 2017-05-28 MED ORDER — LISINOPRIL 40 MG PO TABS: 40.0000 mg | ORAL_TABLET | Freq: Every day | ORAL | 4 refills | Status: DC

## 2017-05-28 MED ORDER — EMTRICITABINE-TENOFOVIR AF 200-25 MG PO TABS: 1.0000 | ORAL_TABLET | Freq: Every day | ORAL | 4 refills | Status: DC

## 2017-05-28 NOTE — Progress Notes (Signed)
HPI: [name] is a 51 y.o. male who is here to see pharmacy after being out of care since 2017  Allergies: Allergies  Allergen Reactions  . Apple Swelling    Vitals:    Past Medical History: Past Medical History:  Diagnosis Date  . Abnormal finding on GI tract imaging   . Allergy   . Candidiasis   . Hepatitis C   . Herpes iris   . HIV disease (HCC)   . Hypertension   . Hypokalemia   . Plantar nerve lesion   . Rectal bleeding   . Retinitis   . Syncope   . Ulcer (HCC)   . Venereal wart     Social History: Social History   Social History  . Marital status: Single    Spouse name: N/A  . Number of children: N/A  . Years of education: N/A   Social History Main Topics  . Smoking status: Never Smoker  . Smokeless tobacco: Never Used  . Alcohol use No  . Drug use: No  . Sexual activity: No     Comment: pt. declined condoms   Other Topics Concern  . Not on file   Social History Narrative  . No narrative on file    Previous Regimen: Triumeq, ATV/r, TRV, CBV, ISN, TDF  Current Regimen: Genvoya/DRV  Labs: HIV 1 RNA Quant (copies/mL)  Date Value  01/31/2016 27,821 (H)  01/15/2016 72,198 (H)  10/05/2015 10,623 (H)   CD4 T Cell Abs (/uL)  Date Value  01/15/2016 160 (L)  08/22/2015 70 (L)  04/04/2014 240 (L)   Hep B S Ab (no units)  Date Value  10/06/2013 NEG   Hepatitis B Surface Ag (no units)  Date Value  01/12/2007 NO   HCV Ab (no units)  Date Value  10/06/2013 REACTIVE (A)    CrCl: CrCl cannot be calculated (Patient's most recent lab result is older than the maximum 21 days allowed.).  Lipids:    Component Value Date/Time   CHOL 126 12/17/2012 1513   TRIG 246 (H) 12/17/2012 1513   HDL 26 (L) 12/17/2012 1513   CHOLHDL 4.8 12/17/2012 1513   VLDL 49 (H) 12/17/2012 1513   LDLCALC 51 12/17/2012 1513   HIV Genotype Composite Data Genotype Dates:   Mutations in Bold impact drug susceptibility RT Mutations M184V  PI Mutations  None  Integrase Mutations N155H   Interpretation of Genotype Data per [employer] HIV Database Nucleoside RTIs  abacavir (ABC) Low-Level Resistance zidovudine (AZT) Susceptible emtricitabine (FTC) High-Level Resistance lamivudine (3TC) High-Level Resistance tenofovir (TDF) Susceptible   Non-Nucleoside RTIs  efavirenz (EFV) Susceptible etravirine (ETR) Susceptible nevirapine (NVP) Susceptible rilpivirine (RPV) Susceptible   Protease Inhibitors  None   Integrase Inhibitors  bictegravir (BIC) Potential Low-Level Resistance dolutegravir (DTG) Potential Low-Level Resistance elvitegravir (EVG) High-Level Resistance raltegravir (RAL) High-Level Resistance    Assessment: [name] has not seen [place] for over a year. He has had a poor history of compliance that has led to some mutation. This is one of the reasons why he is on the current regimen. Spent a good 30 minutes digging through his profile to check for resistance. Apparently, he has picked up N155H in the past so EVG is now out. Therefore, the current regimen would only really provide 2 active drugs. When asked about why he has not been taking his meds, he stated that he was going through a tough stretch after his mom died. He decided to give up taking meds since then. He  stated that he recently restarted his regimen. I showed him the resistance mutations that he has acquired. I spent about 45 minutes going over the importance of adherence to avoid this in the future. He was very appreciative of me spending this much time convincing him the importance of taking meds. I'm going to craft a new regimen with Prezcobix/DTG/Descovy for him. The cobicistat in Prezcobix should boost the DTG to about BID dosing. Made him a calendar. He is going to f/u with me in 1 month for labs. Going to start the Menveo vaccine today.   Recommendations:  HIV labs today Stop Genvoya/DRV Start Prezcobix 1 PO qday Start Descovy 1 PO qday Start DTG 50mg  PO  qday Refill lisinopril and valtrex Menveo #1 today F/u with me in 4 wks F/u with Dr. Ninetta LightsHatcher in Oct  Gerrie Castiglia, PharmD, BCPS, AAHIVP, CPP Clinical Infectious Disease Pharmacist [facility] 05/28/2017, 3:13 PM

## 2017-05-28 NOTE — Patient Instructions (Signed)
Stop Genvoya and Darunavir Start Descovy 1 tablet daily with breakfast Start Prezcobix 1 tablet daily breakfast Start Tivicay 50mg  daily with breakfast Come back and see me in 6 wks Follow up with Dr. Ninetta LightsHatcher in Oct

## 2017-05-29 LAB — T-HELPER CELL (CD4) - (RCID CLINIC ONLY)
CD4 % Helper T Cell: 10 % — ABNORMAL LOW (ref 33–55)
CD4 T CELL ABS: 230 /uL — AB (ref 400–2700)

## 2017-05-29 LAB — URINE CYTOLOGY ANCILLARY ONLY
Chlamydia: NEGATIVE
NEISSERIA GONORRHEA: NEGATIVE

## 2017-05-29 LAB — RPR

## 2017-06-04 LAB — HIV RNA, RTPCR W/R GT (RTI, PI,INT)
HIV-1 RNA, QN PCR: 19600 {copies}/mL — AB
HIV-1 RNA, QN PCR: 4.29 Log copies/mL — ABNORMAL HIGH

## 2017-06-11 LAB — RFLX HIV-1 INTEGRASE GENOTYPE

## 2017-06-11 LAB — HIV-1 GENOTYPE: HIV-1 GENOTYPE: DETECTED — AB

## 2017-07-10 ENCOUNTER — Ambulatory Visit (INDEPENDENT_AMBULATORY_CARE_PROVIDER_SITE_OTHER): Payer: Self-pay | Admitting: Pharmacist Clinician (PhC)/ Clinical Pharmacy Specialist

## 2017-07-10 ENCOUNTER — Ambulatory Visit (INDEPENDENT_AMBULATORY_CARE_PROVIDER_SITE_OTHER): Payer: Self-pay | Admitting: Licensed Clinical Social Worker

## 2017-07-10 DIAGNOSIS — F4320 Adjustment disorder, unspecified: Secondary | ICD-10-CM

## 2017-07-10 DIAGNOSIS — B182 Chronic viral hepatitis C: Secondary | ICD-10-CM

## 2017-07-10 DIAGNOSIS — B20 Human immunodeficiency virus [HIV] disease: Secondary | ICD-10-CM

## 2017-07-10 DIAGNOSIS — Z23 Encounter for immunization: Secondary | ICD-10-CM

## 2017-07-10 LAB — HEPATITIS B SURFACE ANTIBODY,QUALITATIVE: Hep B S Ab: NONREACTIVE

## 2017-07-10 NOTE — Patient Instructions (Addendum)
Continue your Tivica/Prezcobix/Descovy Follow up with Dr. Ninetta Lights in Oct We will start treatment for hep C at that point

## 2017-07-10 NOTE — Progress Notes (Signed)
HPI: [name] is a 51 y.o. male who is here to see pharmacy for his adherence check and labs.   Allergies: Allergies  Allergen Reactions  . Apple Swelling    Vitals:    Past Medical History: Past Medical History:  Diagnosis Date  . Abnormal finding on GI tract imaging   . Allergy   . Candidiasis   . Hepatitis C   . Herpes iris   . HIV disease (HCC)   . Hypertension   . Hypokalemia   . Plantar nerve lesion   . Rectal bleeding   . Retinitis   . Syncope   . Ulcer (HCC)   . Venereal wart     Social History: Social History   Social History  . Marital status: Single    Spouse name: N/A  . Number of children: N/A  . Years of education: N/A   Social History Main Topics  . Smoking status: Never Smoker  . Smokeless tobacco: Never Used  . Alcohol use No  . Drug use: No  . Sexual activity: No     Comment: pt. declined condoms   Other Topics Concern  . Not on file   Social History Narrative  . No narrative on file    Previous Regimen: Triumeq, ATV/r, TRV, CBV, ISN, TDF, Genvoya/DRV  Current Regimen: Prezcobix/DTG/Descovy  Labs: HIV 1 RNA Quant (copies/mL)  Date Value  01/31/2016 27,821 (H)  01/15/2016 72,198 (H)  10/05/2015 10,623 (H)   CD4 T Cell Abs (/uL)  Date Value  05/28/2017 230 (L)  01/15/2016 160 (L)  08/22/2015 70 (L)   Hep B S Ab (no units)  Date Value  10/06/2013 NEG   Hepatitis B Surface Ag (no units)  Date Value  01/12/2007 NO   HCV Ab (no units)  Date Value  10/06/2013 REACTIVE (A)    CrCl: CrCl cannot be calculated (Patient's most recent lab result is older than the maximum 21 days allowed.).  Lipids:    Component Value Date/Time   CHOL 156 05/28/2017 1538   TRIG 155 (H) 05/28/2017 1538   HDL 37 (L) 05/28/2017 1538   CHOLHDL 4.2 05/28/2017 1538   VLDL 31 (H) 05/28/2017 1538   LDLCALC 88 05/28/2017 1538    Assessment: [name] recently saw me to deal with his adherence and resistance issue. He has developed  a good amount of resistance already. Therefore, we put him on the salvage regimen of DTG/Prezc/Descovy. He likes this regimen a lot and has not had any issue with side effects. He has not missed a dose since the last visit with me. We are going to repeat his labs today.   While reviewing his labs, he also has untreated hep C with genotype 1a. Discuss the option of treating his hep C soon if he is proving to be reliable with his adherence. Explained to him about the commitment to therapy because it can develop resistance very easily and cost. We will repeat his VL today including Fibrosure. ADAP will cover hep C meds now so it will not be an issue when treatment is started. His hep A titer was neg, therefore, we will revaccinate him today and check his hep B titer before revaccination. He is due for the second Menveo in Sept but that can be given at the next visit with Dr. Ninetta Lights in Oct. His ADAP was submitted last week.   Otherwise, I think he is doing well with his adherence. He is still sad about the lost of  his mom. Cordelia Pen agreed to see him today.   Recommendations:  Cont DTG/Prezc/Descovy HIV labs today Hep C VL, Fibrosure, INR, CMET Hep A vaccine Menveo #2 in Oct F/u with Dr. Ninetta Lights in Oct  Minh Pham, PharmD, BCPS, AAHIVP, CPP Clinical Infectious Disease Pharmacist [facility] 07/10/2017, 2:42 PM

## 2017-07-11 LAB — COMPREHENSIVE METABOLIC PANEL
ALBUMIN: 4.4 g/dL (ref 3.6–5.1)
ALT: 79 U/L — ABNORMAL HIGH (ref 9–46)
AST: 41 U/L — ABNORMAL HIGH (ref 10–35)
Alkaline Phosphatase: 68 U/L (ref 40–115)
BILIRUBIN TOTAL: 0.4 mg/dL (ref 0.2–1.2)
BUN: 12 mg/dL (ref 7–25)
CHLORIDE: 107 mmol/L (ref 98–110)
CO2: 18 mmol/L — ABNORMAL LOW (ref 20–32)
CREATININE: 0.86 mg/dL (ref 0.70–1.33)
Calcium: 9.6 mg/dL (ref 8.6–10.3)
Glucose, Bld: 96 mg/dL (ref 65–99)
Potassium: 4 mmol/L (ref 3.5–5.3)
SODIUM: 140 mmol/L (ref 135–146)
TOTAL PROTEIN: 7.6 g/dL (ref 6.1–8.1)

## 2017-07-11 LAB — PROTIME-INR
INR: 1.1
Prothrombin Time: 11.2 s (ref 9.0–11.5)

## 2017-07-11 NOTE — BH Specialist Note (Signed)
Integrated Behavioral Health Initial Visit  MRN: [id number] Name: [name]   Session Start time: 2:50 pm Session End time: 3:30 pm Total time: 40 minutes  Type of Service: Integrated Behavioral Health- Individual/Family Interpretor:No. Interpretor Name and Language: N/A   Warm Hand Off Completed.       SUBJECTIVE: [name] is a 51 y.o. male accompanied by patient. Patient was referred by Dr. Ninetta Lights for grief reactions and sibling conflict.  Patient reports the following symptoms/concerns: Patient reported death of mother two years ago that has changed family dynamics.  Patient reported that he has four siblings but does not associate with all of them due to his perception that they were not supportive of his mother when she was ill.  Patient denied harboring anger toward siblings which contradicted statements made. Patient was able to vent his frustrations with his siblings and describe his mother and his loss.  Patient reported having adequate social support and denied complicated bereavement symptoms.  Additionally patient's symptoms are distinguished from depressive symptoms in that they are mainly thoughts and reminders of his mother, feelings of emptiness and loss, and positive emotions and memories of the deceased.  There is no depressed mood, pervasive misery and unhappiness, or feelings of worthlessness.  At this time the patient denied need for: grief, individual, and family counseling and was not willing to arrange appointment with [facility].    Duration of problem: 2 years; Severity of problem: mild  OBJECTIVE: Mood: Angry and Affect: within range Risk of harm to self or others: No plan to harm self or others  Thought process: Tangential Thought content: logical   LIFE CONTEXT: Family and Social: Patient lives alone in the house he shared with his mother. School/Work: Patient does not work. Self-Care: Patient is able to tend to his ADL's. Life Changes:  Patient is having difficulty adjusting socially to his siblings.  ASSESSMENT: Patient is currently experiencing normalized grief reactions and family conflict and may benefit from grief, individual and family counseling.  GOALS ADDRESSED: Patient will reduce symptoms of: agitation and increase knowledge and/or ability of: coping skills, healthy habits and stress reduction and also: Increase healthy adjustment to current life circumstances and Increase adequate support systems for patient/family  INTERVENTIONS: Supportive Counseling   PLAN: 1. Follow up with behavioral health clinician on : Patient was not willing to schedule appointment with [facility]. 2. Behavioral recommendations: Patient will seek assistance from the [facility] for resources, coping strategies, and referrals as needed.   Vergia Alberts, [facility]

## 2017-07-14 LAB — LIVER FIBROSIS, FIBROTEST-ACTITEST
ALT: 77 U/L — ABNORMAL HIGH (ref 9–46)
APOLIPOPROTEIN A1: 124 mg/dL (ref 94–176)
Alpha-2-Macroglobulin: 332 mg/dL — ABNORMAL HIGH (ref 106–279)
Bilirubin: 0.4 mg/dL (ref 0.2–1.2)
Fibrosis Score: 0.43
GGT: 35 U/L (ref 3–95)
HAPTOGLOBIN: 181 mg/dL (ref 43–212)
Necroinflammat ACT Score: 0.51
Reference ID: 2081904

## 2017-07-15 LAB — HEPATITIS C RNA QUANTITATIVE
HCV QUANT: 1950000 [IU]/mL — AB
HCV Quantitative Log: 6.29 Log IU/mL — ABNORMAL HIGH

## 2017-07-15 LAB — HIV-1 RNA QUANT-NO REFLEX-BLD
HIV 1 RNA QUANT: 28 {copies}/mL — AB
HIV-1 RNA Quant, Log: 1.45 Log copies/mL — ABNORMAL HIGH

## 2017-07-18 ENCOUNTER — Encounter: Payer: Self-pay | Admitting: Pharmacist Clinician (PhC)/ Clinical Pharmacy Specialist

## 2017-08-26 ENCOUNTER — Encounter: Payer: Self-pay | Admitting: Infectious Diseases

## 2017-09-01 ENCOUNTER — Ambulatory Visit (INDEPENDENT_AMBULATORY_CARE_PROVIDER_SITE_OTHER): Payer: Self-pay | Admitting: Infectious Diseases

## 2017-09-01 ENCOUNTER — Encounter: Payer: Self-pay | Admitting: Infectious Diseases

## 2017-09-01 VITALS — BP 172/92 | HR 94 | Temp 98.8°F | Wt 239.0 lb

## 2017-09-01 DIAGNOSIS — B182 Chronic viral hepatitis C: Secondary | ICD-10-CM

## 2017-09-01 DIAGNOSIS — Z23 Encounter for immunization: Secondary | ICD-10-CM

## 2017-09-01 DIAGNOSIS — Z79899 Other long term (current) drug therapy: Secondary | ICD-10-CM

## 2017-09-01 DIAGNOSIS — B20 Human immunodeficiency virus [HIV] disease: Secondary | ICD-10-CM

## 2017-09-01 DIAGNOSIS — Z113 Encounter for screening for infections with a predominantly sexual mode of transmission: Secondary | ICD-10-CM

## 2017-09-01 DIAGNOSIS — I10 Essential (primary) hypertension: Secondary | ICD-10-CM

## 2017-09-01 MED ORDER — LEDIPASVIR-SOFOSBUVIR 90-400 MG PO TABS: 1.0000 | ORAL_TABLET | Freq: Every day | ORAL | 2 refills | Status: DC

## 2017-09-01 NOTE — Assessment & Plan Note (Signed)
Repeated BP 140-95 Asx.

## 2017-09-01 NOTE — Assessment & Plan Note (Signed)
He is doing much better thanks to pharm Offered/refused condoms.  Flu shot today rtc in 6 months.

## 2017-09-01 NOTE — Addendum Note (Signed)
Addended by: Nicholes Calamity on: 09/01/2017 03:30 PM   Modules accepted: Orders

## 2017-09-01 NOTE — Progress Notes (Signed)
HPI: [name] is a 51 y.o. male who is here to f/u with Dr. Ninetta Lights for his HIV and hep C.   Lab Results  Component Value Date   HCVGENOTYPE 1a 10/05/2015    Allergies: Allergies  Allergen Reactions  . Apple Swelling    Vitals: Temp: 98.8 F (37.1 C) (10/15 1442) Temp Source: Oral (10/15 1442) BP: 172/92 (10/15 1442) Pulse Rate: 94 (10/15 1442)  Past Medical History: Past Medical History:  Diagnosis Date  . Abnormal finding on GI tract imaging   . Allergy   . Candidiasis   . Hepatitis C   . Herpes iris   . HIV disease (HCC)   . Hypertension   . Hypokalemia   . Plantar nerve lesion   . Rectal bleeding   . Retinitis   . Syncope   . Ulcer   . Venereal wart     Social History: Social History   Social History  . Marital status: Single    Spouse name: N/A  . Number of children: N/A  . Years of education: N/A   Social History Main Topics  . Smoking status: Never Smoker  . Smokeless tobacco: Never Used  . Alcohol use No  . Drug use: No  . Sexual activity: No     Comment: pt. declined condoms   Other Topics Concern  . None   Social History Narrative  . None    Labs: HIV 1 RNA Quant (copies/mL)  Date Value  07/10/2017 28 (H)  01/31/2016 16,109 (H)  01/15/2016 72,198 (H)   CD4 T Cell Abs (/uL)  Date Value  05/28/2017 230 (L)  01/15/2016 160 (L)  08/22/2015 70 (L)   Hep B S Ab (no units)  Date Value  07/10/2017 NON-REACTIVE   Hepatitis B Surface Ag (no units)  Date Value  01/12/2007 NO   HCV Ab (no units)  Date Value  10/06/2013 REACTIVE (A)    Lab Results  Component Value Date   HCVGENOTYPE 1a 10/05/2015    Hepatitis C RNA quantitative Latest Ref Rng & Units 07/10/2017 10/05/2015 12/17/2012 01/12/2007  HCV Quantitative NOT DETECTED IU/mL 1,950,000(H) 6,045,409(W) 11,914,782(N) 2,520,000  HCV Quantitative Log NOT DETECTED Log IU/mL 6.29(H) 6.43(H) 7.15(H) -    AST (U/L)  Date Value  07/10/2017 41 (H)  05/28/2017 34   02/09/2016 54 (H)   ALT (U/L)  Date Value  07/10/2017 79 (H)  07/10/2017 77 (H)  05/28/2017 56 (H)  02/09/2016 61   INR (no units)  Date Value  07/10/2017 1.1  10/05/2015 1.12  12/22/2008 1.1    CrCl: CrCl cannot be calculated (Patient's most recent lab result is older than the maximum 21 days allowed.).  Fibrosis Score: F1/2 as assessed by Fibrosure  Child-Pugh Score: Class A  Previous Treatment Regimen: None  Assessment: Tavio is here for his routine HIV visit. He is doing very well on the HIV side as his VL is down to 28 now on the current salvage regimen. Dr. Ninetta Lights would like to treat him for his hep C. He has a 1a virus with a fibrosis score of F1/2. He is very motivated now to start his hep C treatment. He has ADAP so they will cover his Harvoni med. Advised him to be super compliance with the therapy for 3 months. I'll see him back her in 1 month for labs and will schedule him for the cure visit at that time. Counseled him on avoiding all acid suppressing drugs.   Recommendations:  Harvoni 1 PO qday Cont HIV Prezcobix/Descovy/DTG F/u in 1 mo for hep C VL  [employer], [place].D., BCPS, AAHIVP Clinical Infectious Disease Pharmacist [facility] 09/01/2017, 3:23 PM

## 2017-09-01 NOTE — Progress Notes (Signed)
   Subjective:    Patient ID: [name], male    DOB: [birth year], 51 y.o.   MRN: [id number]  HPI 51 yo m with HIV+ , previously on triumeq/tenofovir ---> genvoya/darunavir December 2016. Also taking lisinopril for HTN. He has a hx of Hep C 1a (F1-2) 06-2017.  Was changed to Prezcobix/descovey/tivicay 05-2017.  HIV 1 RNA Quant (copies/mL)  Date Value  07/10/2017 28 (H)  01/31/2016 11,155 (H)  01/15/2016 72,198 (H)   CD4 T Cell Abs (/uL)  Date Value  05/28/2017 230 (L)  01/15/2016 160 (L)  08/22/2015 70 (L)    Has met with Minh before about starting Hep C rx.  Has been feeling well.  Denies missed BP rx.   Review of Systems  Constitutional: Negative for appetite change, chills, fever and unexpected weight change.  Eyes: Negative for visual disturbance.  Respiratory: Negative for cough and shortness of breath.   Gastrointestinal: Negative for constipation and diarrhea.  Endocrine: Negative for polyuria.  Genitourinary: Negative for difficulty urinating, dysuria and hematuria.  Neurological: Negative for headaches.  Psychiatric/Behavioral: Negative for dysphoric mood.  Please see HPI. All other systems reviewed and negative.     Objective:   Physical Exam  Constitutional: He appears well-developed and well-nourished.  HENT:  Mouth/Throat: No oropharyngeal exudate.  Eyes: Pupils are equal, round, and reactive to light. EOM are normal.  Neck: Neck supple.  Cardiovascular: Normal rate, regular rhythm and normal heart sounds.   Pulmonary/Chest: Effort normal and breath sounds normal.  Abdominal: Soft. Bowel sounds are normal. There is no tenderness. There is no rebound.  Musculoskeletal: He exhibits no edema.  Lymphadenopathy:    He has no cervical adenopathy.  Psychiatric: He has a normal mood and affect.  Vitals reviewed.      Assessment & Plan:

## 2017-09-01 NOTE — Assessment & Plan Note (Signed)
Will have him meet with pharm to see if we can start tx.  Will start him on harvoni for 3 months.

## 2017-10-02 ENCOUNTER — Ambulatory Visit: Payer: Self-pay

## 2017-10-07 ENCOUNTER — Ambulatory Visit: Payer: Self-pay

## 2017-10-26 ENCOUNTER — Other Ambulatory Visit: Payer: Self-pay | Admitting: Infectious Diseases

## 2017-10-26 DIAGNOSIS — B20 Human immunodeficiency virus [HIV] disease: Secondary | ICD-10-CM

## 2017-10-26 DIAGNOSIS — B009 Herpesviral infection, unspecified: Secondary | ICD-10-CM

## 2017-10-26 DIAGNOSIS — I10 Essential (primary) hypertension: Secondary | ICD-10-CM

## 2017-10-31 ENCOUNTER — Telehealth: Payer: Self-pay | Admitting: Pharmacist Clinician (PhC)/ Clinical Pharmacy Specialist

## 2017-10-31 NOTE — Telephone Encounter (Signed)
[name] has missed a couple of appts with [name]. He is currently on Harvoni right now for his hep C. We need to do both of his HIV and hep C labs. [facility] faxed [name] a request for some labs. He will come in next week for labs.

## 2017-11-06 ENCOUNTER — Ambulatory Visit (INDEPENDENT_AMBULATORY_CARE_PROVIDER_SITE_OTHER): Payer: Self-pay | Admitting: Pharmacist Clinician (PhC)/ Clinical Pharmacy Specialist

## 2017-11-06 DIAGNOSIS — B20 Human immunodeficiency virus [HIV] disease: Secondary | ICD-10-CM

## 2017-11-06 DIAGNOSIS — Z23 Encounter for immunization: Secondary | ICD-10-CM

## 2017-11-06 DIAGNOSIS — B182 Chronic viral hepatitis C: Secondary | ICD-10-CM

## 2017-11-06 LAB — COMPLETE METABOLIC PANEL WITH GFR
AG RATIO: 1.5 (calc) (ref 1.0–2.5)
ALBUMIN MSPROF: 4.4 g/dL (ref 3.6–5.1)
ALKALINE PHOSPHATASE (APISO): 68 U/L (ref 40–115)
ALT: 16 U/L (ref 9–46)
AST: 17 U/L (ref 10–35)
BILIRUBIN TOTAL: 0.7 mg/dL (ref 0.2–1.2)
BUN: 11 mg/dL (ref 7–25)
CHLORIDE: 105 mmol/L (ref 98–110)
CO2: 25 mmol/L (ref 20–32)
Calcium: 9.6 mg/dL (ref 8.6–10.3)
Creat: 0.83 mg/dL (ref 0.70–1.33)
GFR, EST AFRICAN AMERICAN: 118 mL/min/{1.73_m2} (ref >=60)
GFR, Est Non African American: 102 mL/min/{1.73_m2} (ref >=60)
GLUCOSE: 80 mg/dL (ref 65–99)
Globulin: 3 g/dL (calc) (ref 1.9–3.7)
POTASSIUM: 4 mmol/L (ref 3.5–5.3)
Sodium: 140 mmol/L (ref 135–146)
TOTAL PROTEIN: 7.4 g/dL (ref 6.1–8.1)

## 2017-11-06 NOTE — Progress Notes (Signed)
HPI: [name] is a 51 y.o. male is here to see pharmacy for his HIV and hep C visit.  Lab Results  Component Value Date   HCVGENOTYPE 1a 10/05/2015    Allergies: Allergies  Allergen Reactions  . Apple Swelling    Vitals:    Past Medical History: Past Medical History:  Diagnosis Date  . Abnormal finding on GI tract imaging   . Allergy   . Candidiasis   . Hepatitis C   . Herpes iris   . HIV disease (HCC)   . Hypertension   . Hypokalemia   . Plantar nerve lesion   . Rectal bleeding   . Retinitis   . Syncope   . Ulcer   . Venereal wart     Social History: Social History   Socioeconomic History  . Marital status: Single    Spouse name: Not on file  . Number of children: Not on file  . Years of education: Not on file  . Highest education level: Not on file  Social Needs  . Financial resource strain: Not on file  . Food insecurity - worry: Not on file  . Food insecurity - inability: Not on file  . Transportation needs - medical: Not on file  . Transportation needs - non-medical: Not on file  Occupational History  . Not on file  Tobacco Use  . Smoking status: Never Smoker  . Smokeless tobacco: Never Used  Substance and Sexual Activity  . Alcohol use: No  . Drug use: No  . Sexual activity: No    Comment: pt. declined condoms  Other Topics Concern  . Not on file  Social History Narrative  . Not on file    Labs: HIV 1 RNA Quant (copies/mL)  Date Value  07/10/2017 28 (H)  01/31/2016 60,45427,821 (H)  01/15/2016 72,198 (H)   CD4 T Cell Abs (/uL)  Date Value  05/28/2017 230 (L)  01/15/2016 160 (L)  08/22/2015 70 (L)   Hep B S Ab (no units)  Date Value  07/10/2017 NON-REACTIVE   Hepatitis B Surface Ag (no units)  Date Value  01/12/2007 NO   HCV Ab (no units)  Date Value  10/06/2013 REACTIVE (A)    Lab Results  Component Value Date   HCVGENOTYPE 1a 10/05/2015    Hepatitis C RNA quantitative Latest Ref Rng & Units 07/10/2017 10/05/2015  12/17/2012 01/12/2007  HCV Quantitative NOT DETECTED IU/mL 1,950,000(H) 0,981,191(Y2,685,134(H) 78,295,621(H14,172,115(H) 2,520,000  HCV Quantitative Log NOT DETECTED Log IU/mL 6.29(H) 6.43(H) 7.15(H) -    AST (U/L)  Date Value  07/10/2017 41 (H)  05/28/2017 34  02/09/2016 54 (H)   ALT (U/L)  Date Value  07/10/2017 79 (H)  07/10/2017 77 (H)  05/28/2017 56 (H)  02/09/2016 61   INR (no units)  Date Value  07/10/2017 1.1  10/05/2015 1.12  12/22/2008 1.1    CrCl: CrCl cannot be calculated (Patient's most recent lab result is older than the maximum 21 days allowed.).  Fibrosis Score: F1/2 as assessed by Fibrosure  Child-Pugh Score: Class A  Previous Treatment Regimen: None  Assessment: [name] started on Harvoni about 10/16 so he is on his second month now. He has not missed a dose even though he doesn't always take it at the same time everyday. Encourage him to do it on a consistent basis. [name] has developed a significant amount of mutations over the years and that's the reason why he is on the current regimen. He is doing pretty well  on it. It should be suppressed today if his adherence has been good. His CD4 has increased to 230 in July. We will recheck today.   He still has not develop hep B ab so we will restart the series. We will see him one month for his 2nd shot and his EOT visit for hep C. I'll set up a cure and HIV f/u appt with Dr Ninetta LightsHatcher in the near future. He needs to renew his ADAP in Jan.  Recommendations:  Cont harvoni x 12 wks Hep C VL, CMP, HIV VL today Hep B #1, Menveo #2 today F/u in 1 month for hep C EOT and second hep B  [name], Pharm.D., BCPS, AAHIVP Clinical Infectious Disease Pharmacist [facility] 11/06/2017, 2:11 PM

## 2017-11-07 LAB — T-HELPER CELL (CD4) - (RCID CLINIC ONLY)
CD4 T CELL HELPER: 10 % — AB (ref 33–55)
CD4 T Cell Abs: 250 /uL — ABNORMAL LOW (ref 400–2700)

## 2017-11-08 LAB — HIV-1 RNA QUANT-NO REFLEX-BLD
HIV 1 RNA QUANT: 347 {copies}/mL — AB
HIV-1 RNA Quant, Log: 2.54 Log copies/mL — ABNORMAL HIGH

## 2017-11-10 LAB — HEPATITIS C RNA QUANTITATIVE
HCV Quantitative Log: <1.18 Log IU/mL
HCV RNA, PCR, QN: <15 IU/mL

## 2017-11-14 ENCOUNTER — Telehealth: Payer: Self-pay | Admitting: Pharmacist Clinician (PhC)/ Clinical Pharmacy Specialist

## 2017-11-14 NOTE — Telephone Encounter (Signed)
His hep C VL is good but HIV VL up a little. Called to check on adherence. Someone else answered so told them to tell [name] to call back. He will be here for the EOT appt in a couple of weeks. We will repeat labs then.

## 2017-11-26 ENCOUNTER — Other Ambulatory Visit: Payer: Self-pay | Admitting: Infectious Diseases

## 2017-12-10 ENCOUNTER — Ambulatory Visit: Payer: Self-pay

## 2017-12-16 ENCOUNTER — Ambulatory Visit: Payer: Self-pay

## 2018-01-07 ENCOUNTER — Ambulatory Visit: Payer: Self-pay

## 2018-01-08 ENCOUNTER — Ambulatory Visit: Payer: Self-pay

## 2018-01-14 ENCOUNTER — Ambulatory Visit: Payer: Self-pay

## 2018-01-14 ENCOUNTER — Encounter: Payer: Self-pay | Admitting: Infectious Diseases

## 2018-04-07 ENCOUNTER — Telehealth: Payer: Self-pay

## 2018-04-07 NOTE — Telephone Encounter (Signed)
Left a vm for pt to call us back to schedule an appt to be seen at our clinic. [name], [place]

## 2018-04-08 ENCOUNTER — Telehealth: Payer: Self-pay

## 2018-04-08 NOTE — Telephone Encounter (Signed)
Called pt to schedule an appt with lab and Dr. Ninetta Lights. PT was able to make both appts. PT states he would like all future appts to be after 12 since he works Monday- Friday 5-12. Pt has some concerns regarding a commercial he saw about a medication for hiv stated that he would like for [place] to check his kidney function and blood sugar levels when he comes in for his lab visit. Lorenso Courier, [place]

## 2018-04-15 ENCOUNTER — Other Ambulatory Visit: Payer: Self-pay

## 2018-04-15 DIAGNOSIS — Z79899 Other long term (current) drug therapy: Secondary | ICD-10-CM

## 2018-04-15 DIAGNOSIS — B20 Human immunodeficiency virus [HIV] disease: Secondary | ICD-10-CM

## 2018-04-15 DIAGNOSIS — Z113 Encounter for screening for infections with a predominantly sexual mode of transmission: Secondary | ICD-10-CM

## 2018-04-16 ENCOUNTER — Other Ambulatory Visit: Payer: Self-pay | Admitting: Infectious Diseases

## 2018-04-16 DIAGNOSIS — B20 Human immunodeficiency virus [HIV] disease: Secondary | ICD-10-CM

## 2018-04-16 DIAGNOSIS — I10 Essential (primary) hypertension: Secondary | ICD-10-CM

## 2018-04-16 LAB — URINE CYTOLOGY ANCILLARY ONLY
Chlamydia: NEGATIVE
Neisseria Gonorrhea: NEGATIVE

## 2018-04-16 LAB — COMPREHENSIVE METABOLIC PANEL
AG RATIO: 1.5 (calc) (ref 1.0–2.5)
ALKALINE PHOSPHATASE (APISO): 71 U/L (ref 40–115)
ALT: 13 U/L (ref 9–46)
AST: 14 U/L (ref 10–35)
Albumin: 4.5 g/dL (ref 3.6–5.1)
BUN: 12 mg/dL (ref 7–25)
CHLORIDE: 106 mmol/L (ref 98–110)
CO2: 21 mmol/L (ref 20–32)
Calcium: 9.8 mg/dL (ref 8.6–10.3)
Creat: 0.92 mg/dL (ref 0.70–1.33)
GLOBULIN: 3.1 g/dL (ref 1.9–3.7)
Glucose, Bld: 114 mg/dL — ABNORMAL HIGH (ref 65–99)
Potassium: 3.6 mmol/L (ref 3.5–5.3)
Sodium: 138 mmol/L (ref 135–146)
Total Bilirubin: 0.5 mg/dL (ref 0.2–1.2)
Total Protein: 7.6 g/dL (ref 6.1–8.1)

## 2018-04-16 LAB — LIPID PANEL
Cholesterol: 176 mg/dL (ref <200)
HDL: 41 mg/dL (ref >40)
LDL Cholesterol (Calc): 104 mg/dL (calc) — ABNORMAL HIGH
NON-HDL CHOLESTEROL (CALC): 135 mg/dL — AB (ref <130)
TRIGLYCERIDES: 194 mg/dL — AB (ref <150)
Total CHOL/HDL Ratio: 4.3 (calc) (ref <5.0)

## 2018-04-16 LAB — CBC
HEMATOCRIT: 40.2 % (ref 38.5–50.0)
HEMOGLOBIN: 14.6 g/dL (ref 13.2–17.1)
MCH: 34.8 pg — ABNORMAL HIGH (ref 27.0–33.0)
MCHC: 36.3 g/dL — ABNORMAL HIGH (ref 32.0–36.0)
MCV: 95.9 fL (ref 80.0–100.0)
MPV: 8.9 fL (ref 7.5–12.5)
Platelets: 239 10*3/uL (ref 140–400)
RBC: 4.19 10*6/uL — AB (ref 4.20–5.80)
RDW: 12.9 % (ref 11.0–15.0)
WBC: 5.6 10*3/uL (ref 3.8–10.8)

## 2018-04-16 LAB — T-HELPER CELL (CD4) - (RCID CLINIC ONLY)
CD4 T CELL ABS: 240 /uL — AB (ref 400–2700)
CD4 T CELL HELPER: 10 % — AB (ref 33–55)

## 2018-04-16 LAB — RPR: RPR: NONREACTIVE

## 2018-04-17 LAB — HIV-1 RNA QUANT-NO REFLEX-BLD
HIV 1 RNA QUANT: DETECTED {copies}/mL — AB
HIV-1 RNA QUANT, LOG: DETECTED {Log_copies}/mL — AB

## 2018-04-28 ENCOUNTER — Ambulatory Visit: Payer: Self-pay | Admitting: Infectious Diseases

## 2018-05-24 ENCOUNTER — Other Ambulatory Visit: Payer: Self-pay | Admitting: Infectious Diseases

## 2018-05-24 DIAGNOSIS — B009 Herpesviral infection, unspecified: Secondary | ICD-10-CM

## 2018-05-24 DIAGNOSIS — B20 Human immunodeficiency virus [HIV] disease: Secondary | ICD-10-CM

## 2018-06-19 ENCOUNTER — Other Ambulatory Visit: Payer: Self-pay | Admitting: Infectious Diseases

## 2018-06-19 DIAGNOSIS — B009 Herpesviral infection, unspecified: Secondary | ICD-10-CM

## 2018-06-19 DIAGNOSIS — B20 Human immunodeficiency virus [HIV] disease: Secondary | ICD-10-CM

## 2018-07-15 ENCOUNTER — Ambulatory Visit: Payer: Self-pay

## 2018-07-15 ENCOUNTER — Encounter: Payer: Self-pay | Admitting: Infectious Diseases

## 2018-07-22 ENCOUNTER — Other Ambulatory Visit: Payer: Self-pay | Admitting: Infectious Diseases

## 2018-07-22 DIAGNOSIS — B20 Human immunodeficiency virus [HIV] disease: Secondary | ICD-10-CM

## 2018-07-22 DIAGNOSIS — B009 Herpesviral infection, unspecified: Secondary | ICD-10-CM

## 2018-10-24 ENCOUNTER — Other Ambulatory Visit: Payer: Self-pay | Admitting: Infectious Diseases

## 2018-10-24 DIAGNOSIS — B20 Human immunodeficiency virus [HIV] disease: Secondary | ICD-10-CM

## 2018-10-24 DIAGNOSIS — I10 Essential (primary) hypertension: Secondary | ICD-10-CM

## 2018-11-16 ENCOUNTER — Ambulatory Visit: Payer: Self-pay

## 2018-11-16 ENCOUNTER — Other Ambulatory Visit: Payer: Self-pay

## 2018-11-16 ENCOUNTER — Other Ambulatory Visit: Payer: Self-pay | Admitting: Behavioral Health

## 2018-11-16 DIAGNOSIS — Z113 Encounter for screening for infections with a predominantly sexual mode of transmission: Secondary | ICD-10-CM

## 2018-11-16 DIAGNOSIS — B20 Human immunodeficiency virus [HIV] disease: Secondary | ICD-10-CM

## 2018-11-16 DIAGNOSIS — Z79899 Other long term (current) drug therapy: Secondary | ICD-10-CM

## 2018-11-17 LAB — T-HELPER CELL (CD4) - (RCID CLINIC ONLY)
CD4 % Helper T Cell: 12 % — ABNORMAL LOW (ref 33–55)
CD4 T Cell Abs: 250 /uL — ABNORMAL LOW (ref 400–2700)

## 2018-11-19 LAB — CBC WITH DIFFERENTIAL/PLATELET
Absolute Monocytes: 515 cells/uL (ref 200–950)
Basophils Absolute: 9 cells/uL (ref 0–200)
Basophils Relative: 0.2 %
EOS ABS: 221 {cells}/uL (ref 15–500)
Eosinophils Relative: 4.8 %
HEMATOCRIT: 40.7 % (ref 38.5–50.0)
Hemoglobin: 14.2 g/dL (ref 13.2–17.1)
LYMPHS ABS: 2203 {cells}/uL (ref 850–3900)
MCH: 34.5 pg — ABNORMAL HIGH (ref 27.0–33.0)
MCHC: 34.9 g/dL (ref 32.0–36.0)
MCV: 99 fL (ref 80.0–100.0)
MPV: 9.4 fL (ref 7.5–12.5)
Monocytes Relative: 11.2 %
Neutro Abs: 1651 cells/uL (ref 1500–7800)
Neutrophils Relative %: 35.9 %
Platelets: 259 10*3/uL (ref 140–400)
RBC: 4.11 10*6/uL — ABNORMAL LOW (ref 4.20–5.80)
RDW: 12.8 % (ref 11.0–15.0)
Total Lymphocyte: 47.9 %
WBC: 4.6 10*3/uL (ref 3.8–10.8)

## 2018-11-19 LAB — LIPID PANEL
CHOL/HDL RATIO: 4.4 (calc) (ref <5.0)
Cholesterol: 161 mg/dL (ref <200)
HDL: 37 mg/dL — ABNORMAL LOW (ref >40)
LDL Cholesterol (Calc): 89 mg/dL (calc)
Non-HDL Cholesterol (Calc): 124 mg/dL (calc) (ref <130)
Triglycerides: 293 mg/dL — ABNORMAL HIGH (ref <150)

## 2018-11-19 LAB — COMPREHENSIVE METABOLIC PANEL
AG Ratio: 1.6 (calc) (ref 1.0–2.5)
ALT: 14 U/L (ref 9–46)
AST: 13 U/L (ref 10–35)
Albumin: 4.5 g/dL (ref 3.6–5.1)
Alkaline phosphatase (APISO): 81 U/L (ref 40–115)
BUN: 10 mg/dL (ref 7–25)
CO2: 26 mmol/L (ref 20–32)
Calcium: 9.7 mg/dL (ref 8.6–10.3)
Chloride: 106 mmol/L (ref 98–110)
Creat: 0.89 mg/dL (ref 0.70–1.33)
Globulin: 2.8 g/dL (calc) (ref 1.9–3.7)
Glucose, Bld: 93 mg/dL (ref 65–99)
Potassium: 3.8 mmol/L (ref 3.5–5.3)
Sodium: 139 mmol/L (ref 135–146)
TOTAL PROTEIN: 7.3 g/dL (ref 6.1–8.1)
Total Bilirubin: 0.3 mg/dL (ref 0.2–1.2)

## 2018-11-19 LAB — HIV-1 RNA QUANT-NO REFLEX-BLD
HIV 1 RNA Quant: 41 copies/mL — ABNORMAL HIGH
HIV-1 RNA Quant, Log: 1.61 Log copies/mL — ABNORMAL HIGH

## 2018-11-19 LAB — RPR: RPR Ser Ql: NONREACTIVE

## 2018-11-20 LAB — URINE CYTOLOGY ANCILLARY ONLY
Chlamydia: NEGATIVE
NEISSERIA GONORRHEA: NEGATIVE

## 2018-11-22 ENCOUNTER — Other Ambulatory Visit: Payer: Self-pay | Admitting: Infectious Diseases

## 2018-11-22 DIAGNOSIS — I10 Essential (primary) hypertension: Secondary | ICD-10-CM

## 2018-11-22 DIAGNOSIS — B20 Human immunodeficiency virus [HIV] disease: Secondary | ICD-10-CM

## 2018-11-22 DIAGNOSIS — B009 Herpesviral infection, unspecified: Secondary | ICD-10-CM

## 2018-12-10 ENCOUNTER — Ambulatory Visit: Payer: Self-pay | Admitting: Family

## 2018-12-14 ENCOUNTER — Ambulatory Visit: Payer: Self-pay | Admitting: Family

## 2018-12-14 ENCOUNTER — Encounter: Payer: Self-pay | Admitting: Family

## 2018-12-14 VITALS — BP 132/82 | HR 92 | Temp 98.9°F | Wt 237.0 lb

## 2018-12-14 DIAGNOSIS — Z0000 Encounter for general adult medical examination without abnormal findings: Secondary | ICD-10-CM

## 2018-12-14 DIAGNOSIS — B182 Chronic viral hepatitis C: Secondary | ICD-10-CM

## 2018-12-14 DIAGNOSIS — E781 Pure hyperglyceridemia: Secondary | ICD-10-CM

## 2018-12-14 DIAGNOSIS — Z23 Encounter for immunization: Secondary | ICD-10-CM

## 2018-12-14 DIAGNOSIS — B20 Human immunodeficiency virus [HIV] disease: Secondary | ICD-10-CM

## 2018-12-14 MED ORDER — DARUNAVIR-COBICISTAT 800-150 MG PO TABS: ORAL_TABLET | ORAL | 5 refills | Status: DC

## 2018-12-14 MED ORDER — EMTRICITABINE-TENOFOVIR AF 200-25 MG PO TABS: 1.0000 | ORAL_TABLET | Freq: Every day | ORAL | 5 refills | Status: DC

## 2018-12-14 MED ORDER — DOLUTEGRAVIR SODIUM 50 MG PO TABS: 50.0000 mg | ORAL_TABLET | Freq: Every day | ORAL | 5 refills | Status: DC

## 2018-12-14 NOTE — Progress Notes (Signed)
Subjective:    Patient ID: [name], adult    DOB: 07/18/1966, 53 y.o.   MRN: [id number]  No chief complaint on file.    HPI:  [name] is a 53 y.o. adult who presents today for routine follow up of HIV disease.   Mr. [name] was last seen in the office on 09/01/17 for routine follow up and placed on a salvage regimen of TIvicay/Descovy/Prezcobix. Most recent blood work completed on 11/16/18 with a viral load that was undetectable at 41 and CD4 count of 250. Kidney function, liver function and electrolytes were normal. Negative for gonorrhea, chlamydia or syphilis. UMAP was recently updated. Health maintenance include influenza and Prevnar vaccinations as well as dental exam.   Mr. [name] continues to take his medications as prescribed with no adverse side effects. He has no problems obtaining his medications from [facility] through [facility]. 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 Not currently sexually active and declines condoms. He is due for a dental screening.   Denies fevers, chills, night sweats, headaches, changes in vision, neck pain/stiffness, nausea, diarrhea, vomiting, lesions or rashes.  Allergies  Allergen Reactions  . Apple Swelling      Outpatient Medications Prior to Visit  Medication Sig Dispense Refill  . HARVONI 90-400 MG TABS TAKE 1 TABLET BY MOUTH DAILY WITH BREAKFAST 28 tablet 0  . lisinopril (PRINIVIL,ZESTRIL) 40 MG tablet TAKE 1 TABLET BY MOUTH DAILY 30 tablet 0  . valACYclovir (VALTREX) 500 MG tablet TAKE 1 TABLET BY MOUTH TWICE DAILY 60 tablet 0  . DESCOVY 200-25 MG tablet TAKE 1 TABLET BY MOUTH DAILY 30 tablet 0  . PREZCOBIX 800-150 MG tablet TABLET K1 TABLET BY MOUTH DAILY WITH BREAKFAST. SWALLOW WHOLE. DO NOT CRUSH, BREAK OR CHEW. TAKE WITH FOOD. 30 tablet 0  . TIVICAY 50 MG tablet TAKE 1 TABLET BY MOUTH DAILY 30 tablet 0  . oxyCODONE-acetaminophen (PERCOCET/ROXICET) 5-325 MG tablet  Take 1-2 tablets by mouth every 6 (six) hours as needed. (Patient not taking: Reported on 05/28/2017) 20 tablet 0   No facility-administered medications prior to visit.      Past Medical History:  Diagnosis Date  . Abnormal finding on GI tract imaging   . Allergy   . Candidiasis   . Hepatitis C   . Herpes iris   . HIV disease (HCC)   . Hypertension   . Hypokalemia   . Plantar nerve lesion   . Rectal bleeding   . Retinitis   . Syncope   . Ulcer   . Venereal wart      History reviewed. No pertinent surgical history.     Review of Systems  Constitutional: Negative for appetite change, chills, diaphoresis, fatigue, fever and unexpected weight change.  Eyes:       Negative for acute change in vision  Respiratory: Negative for chest tightness, shortness of breath and wheezing.   Cardiovascular: Negative for chest pain.  Gastrointestinal: Negative for diarrhea, nausea and vomiting.  Genitourinary: Negative for dysuria.  Musculoskeletal: Negative for neck pain and neck stiffness.  Skin: Negative for rash.  Neurological: Negative for seizures, syncope, weakness and headaches.  Hematological: Negative for adenopathy. Does not bruise/bleed easily.  Psychiatric/Behavioral: Negative for hallucinations.      Objective:    BP 132/82   Pulse 92   Temp 98.9 F (37.2 C) (Oral)   Wt 237 lb (107.5 kg)   BMI 32.14 kg/m  Nursing note and vital signs reviewed.  Physical Exam Constitutional:      General: She is not in acute distress.    Appearance: She is well-developed.  Eyes:     Conjunctiva/sclera: Conjunctivae normal.  Neck:     Musculoskeletal: Neck supple.  Cardiovascular:     Rate and Rhythm: Normal rate and regular rhythm.     Heart sounds: Normal heart sounds. No murmur. No friction rub. No gallop.   Pulmonary:     Effort: Pulmonary effort is normal. No respiratory distress.     Breath sounds: Normal breath sounds. No wheezing or rales.  Chest:     Chest wall:  No tenderness.  Abdominal:     General: Bowel sounds are normal.     Palpations: Abdomen is soft.     Tenderness: There is no abdominal tenderness.  Lymphadenopathy:     Cervical: No cervical adenopathy.  Skin:    General: Skin is warm and dry.     Findings: No rash.  Neurological:     Mental Status: She is alert and oriented to person, place, and time.  Psychiatric:        Behavior: Behavior normal.        Thought Content: Thought content normal.        Judgment: Judgment normal.        Assessment & Plan:   Problem List Items Addressed This Visit      Digestive   Hepatitis C    Previously completed treatment with no SVR results. Will obtain Hepatitis C RNA level to ensure SVR was achieved.       Relevant Medications   dolutegravir (TIVICAY) 50 MG tablet   darunavir-cobicistat (PREZCOBIX) 800-150 MG tablet   emtricitabine-tenofovir AF (DESCOVY) 200-25 MG tablet   Other Relevant Orders   Hepatitis C RNA quantitative     Other   Human immunodeficiency virus (HIV) disease (HCC) - Primary    Mr. [name] appears to have stable HIV disease with a suppressed viral load and good tolerance and adherence to his medication regimen. No current signs/symptoms of opportunistic infection or progressive HIV disease at present. UMAP is up to date. Continue current dose of Tivicay/Descovy/Prezcobix. Plan for follow up in 6 months or sooner if needed with lab work 1-2 weeks prior to appointment.       Relevant Medications   dolutegravir (TIVICAY) 50 MG tablet   darunavir-cobicistat (PREZCOBIX) 800-150 MG tablet   emtricitabine-tenofovir AF (DESCOVY) 200-25 MG tablet   Other Relevant Orders   T-helper cell (CD4)- ([facility] clinic only)   HIV-1 RNA quant-no reflex-bld   CBC   Comprehensive metabolic panel   Lipid panel   RPR   Pneumococcal conjugate vaccine 13-valent IM (Completed)   High triglycerides    Most recent blood work with elevated triglycerides and lower HDL concerning for  insulin resistance. Discussed reducing carbohydrate and processed sugary food intake to help reduce this without medication. If remains elevated may need to consider medication as he is already at increased risk for cardiovascular disease with HIV.       Health care maintenance     Influenza and Prevnar updated today  Declines condoms. Reminded of importance of safe sex practice to reduce risk of acquisition and transmission of STI.   [facility] for routine dental care.   STI screening negative.   Encourage colon cancer screening.          Other Visit Diagnoses    Need  for immunization against influenza       Relevant Orders   Flu Vaccine QUAD 36+ mos IM (Completed)   Need for vaccination with 13-polyvalent pneumococcal conjugate vaccine       Relevant Orders   Pneumococcal conjugate vaccine 13-valent IM (Completed)       I have discontinued Trysten T. Bines's oxyCODONE-acetaminophen. I have also changed her TIVICAY to dolutegravir, PREZCOBIX to darunavir-cobicistat, and DESCOVY to emtricitabine-tenofovir AF. Additionally, I am having her maintain her HARVONI, lisinopril, and valACYclovir.   Meds ordered this encounter  Medications  . dolutegravir (TIVICAY) 50 MG tablet    Sig: Take 1 tablet (50 mg total) by mouth daily.    Dispense:  30 tablet    Refill:  5    Order Specific Question:   Supervising Provider    Answer:   Judyann Munson [[id number]]  . darunavir-cobicistat (PREZCOBIX) 800-150 MG tablet    Sig: TABLET 1 TABLET BY MOUTH DAILY WITH BREAKFAST. SWALLOW WHOLE. DO NOT CRUSH, BREAK OR CHEW. TAKE WITH FOOD.    Dispense:  30 tablet    Refill:  5    Please keep January office visit    Order Specific Question:   Supervising Provider    Answer:   Judyann Munson [[id number]]  . emtricitabine-tenofovir AF (DESCOVY) 200-25 MG tablet    Sig: Take 1 tablet by mouth daily.    Dispense:  30 tablet    Refill:  5    Order Specific Question:    Supervising Provider    Answer:   Judyann Munson [[id number]]     Follow-up: Return in about 6 months (around 06/14/2019), or if symptoms worsen or fail to improve.   Marcos Eke, MSN, FNP-C Nurse Practitioner [facility] Office phone: [phone] Pager: [phone] [facility] Main number: [phone]

## 2018-12-14 NOTE — Assessment & Plan Note (Signed)
[name] appears to have stable HIV disease with a suppressed viral load and good tolerance and adherence to his medication regimen. No current signs/symptoms of opportunistic infection or progressive HIV disease at present. UMAP is up to date. Continue current dose of Tivicay/Descovy/Prezcobix. Plan for follow up in 6 months or sooner if needed with lab work 1-2 weeks prior to appointment.

## 2018-12-14 NOTE — Assessment & Plan Note (Signed)
Previously completed treatment with no SVR results. Will obtain Hepatitis C RNA level to ensure SVR was achieved.

## 2018-12-14 NOTE — Assessment & Plan Note (Signed)
Most recent blood work with elevated triglycerides and lower HDL concerning for insulin resistance. Discussed reducing carbohydrate and processed sugary food intake to help reduce this without medication. If remains elevated may need to consider medication as he is already at increased risk for cardiovascular disease with HIV.

## 2018-12-14 NOTE — Assessment & Plan Note (Signed)
   Influenza and Prevnar updated today  Declines condoms. Reminded of importance of safe sex practice to reduce risk of acquisition and transmission of STI.   Referral to [facility] dental clinic placed for routine dental care.   STI screening negative.   Encourage colon cancer screening.

## 2018-12-14 NOTE — Patient Instructions (Signed)
Nice to see you.  Please continue to take your medication as prescribed.  We will plan to follow up in 6 months or sooner if needed with lab work 1-2 weeks prior to appointment.   Have a great day!

## 2018-12-21 ENCOUNTER — Other Ambulatory Visit: Payer: Self-pay | Admitting: Infectious Diseases

## 2018-12-21 DIAGNOSIS — I10 Essential (primary) hypertension: Secondary | ICD-10-CM

## 2018-12-21 DIAGNOSIS — B20 Human immunodeficiency virus [HIV] disease: Secondary | ICD-10-CM

## 2018-12-22 ENCOUNTER — Other Ambulatory Visit: Payer: Self-pay | Admitting: Infectious Diseases

## 2018-12-22 DIAGNOSIS — B20 Human immunodeficiency virus [HIV] disease: Secondary | ICD-10-CM

## 2018-12-22 DIAGNOSIS — B009 Herpesviral infection, unspecified: Secondary | ICD-10-CM

## 2018-12-28 ENCOUNTER — Encounter: Payer: Self-pay | Admitting: Family

## 2019-01-19 ENCOUNTER — Other Ambulatory Visit: Payer: Self-pay | Admitting: Infectious Diseases

## 2019-01-19 DIAGNOSIS — I10 Essential (primary) hypertension: Secondary | ICD-10-CM

## 2019-01-19 DIAGNOSIS — B20 Human immunodeficiency virus [HIV] disease: Secondary | ICD-10-CM

## 2019-01-20 ENCOUNTER — Other Ambulatory Visit: Payer: Self-pay

## 2019-01-20 DIAGNOSIS — I10 Essential (primary) hypertension: Secondary | ICD-10-CM

## 2019-01-20 DIAGNOSIS — B20 Human immunodeficiency virus [HIV] disease: Secondary | ICD-10-CM

## 2019-01-20 MED ORDER — LISINOPRIL 40 MG PO TABS: 40.0000 mg | ORAL_TABLET | Freq: Every day | ORAL | 5 refills | Status: DC

## 2019-06-01 ENCOUNTER — Emergency Department (HOSPITAL_COMMUNITY)
Admission: EM | Admit: 2019-06-01 | Discharge: 2019-06-01 | Disposition: A | Discharge status: Home / Self Care | Payer: Self-pay | Attending: Emergency Medicine | Admitting: Emergency Medicine

## 2019-06-01 ENCOUNTER — Other Ambulatory Visit: Payer: Self-pay

## 2019-06-01 ENCOUNTER — Encounter (HOSPITAL_COMMUNITY): Payer: Self-pay | Admitting: *Deleted

## 2019-06-01 DIAGNOSIS — Z21 Asymptomatic human immunodeficiency virus [HIV] infection status: Secondary | ICD-10-CM | POA: Insufficient documentation

## 2019-06-01 DIAGNOSIS — H6122 Impacted cerumen, left ear: Secondary | ICD-10-CM | POA: Insufficient documentation

## 2019-06-01 DIAGNOSIS — I10 Essential (primary) hypertension: Secondary | ICD-10-CM | POA: Insufficient documentation

## 2019-06-01 DIAGNOSIS — Z79899 Other long term (current) drug therapy: Secondary | ICD-10-CM | POA: Insufficient documentation

## 2019-06-01 NOTE — ED Provider Notes (Signed)
[facility] EM[facility]GENCY [facility] Provider Note   CSN: [id number] Arrival date & time: 06/01/19  0232    History   Chief Complaint Chief Complaint  Patient presents with  . Ear Fullness    HPI [name] is a 53 y.o. adult.     Hx of cerumen impaction in 2016. Reports similar sensation of ear fullness progressing x 2 weeks. No medications taken PTA. No drainage from the ear, otalgia, redness or swelling of the ear, fevers.  The history is provided by the patient. No language interpreter was used.  Ear Fullness This is a new problem. Episode onset: 2 weeks ago. The problem occurs constantly. The problem has been gradually worsening. Pertinent negatives include no headaches. Nothing relieves the symptoms. She has tried nothing for the symptoms. The treatment provided no relief.    Past Medical History:  Diagnosis Date  . Abnormal finding on GI tract imaging   . Allergy   . Candidiasis   . Hepatitis C   . Herpes iris   . HIV disease (HCC)   . Hypertension   . Hypokalemia   . Plantar nerve lesion   . Rectal bleeding   . Retinitis   . Syncope   . Ulcer   . Venereal wart     Patient Active Problem List   Diagnosis Date Noted  . High triglycerides 12/14/2018  . Health care maintenance 12/14/2018  . 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 Depression 10/05/2015  . Rash and nonspecific skin eruption 10/06/2013  . Dental abscess 06/02/2012  . 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 RETINITIS 07/11/2010  . ALLERGIES-SEASONAL 05/07/2010  . CANDIDIASIS, ORAL 08/21/2009  . ABNORMAL FINDINGS GI TRACT 01/13/2009  . HYPOKALEMIA 12/25/2008  . RECTAL BLEEDING 12/25/2008  . 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 Essential hypertension 03/23/2008  . SYNCOPE 01/12/2007  . Hepatitis C 09/27/2006  . 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 HX, PERSONAL, PAST NONCOMPLIANCE 09/27/2006  . Human immunodeficiency virus (HIV) disease (HCC) 02/06/2006     History reviewed. No pertinent surgical history.      Home Medications    Prior to Admission medications   Medication Sig Start Date End Date Taking? Authorizing Provider  darunavir-cobicistat (PREZCOBIX) 800-150 MG tablet TABLET 1 TABLET BY MOUTH DAILY WITH BREAKFAST. SWALLOW WHOLE. DO NOT CRUSH, BREAK OR CHEW. TAKE WITH FOOD. 12/14/18   Veryl Speakalone, Gregory D, FNP  dolutegravir (TIVICAY) 50 MG tablet Take 1 tablet (50 mg total) by mouth daily. 12/14/18   Veryl Speakalone, Gregory D, FNP  emtricitabine-tenofovir AF (DESCOVY) 200-25 MG tablet Take 1 tablet by mouth daily. 12/14/18   Veryl Speakalone, Gregory D, FNP  HARVONI 90-400 MG TABS TAKE 1 TABLET BY MOUTH DAILY WITH BREAKFAST 11/26/17   Ginnie SmartHatcher, Jeffrey C, MD  lisinopril (PRINIVIL,ZESTRIL) 40 MG tablet Take 1 tablet (40 mg total) by mouth daily. 01/20/19   Ginnie SmartHatcher, Jeffrey C, MD  valACYclovir (VALTREX) 500 MG tablet TAKE 1 TABLET BY MOUTH TWICE DAILY 12/22/18   Ginnie SmartHatcher, Jeffrey C, MD  potassium chloride (K-DUR) 10 MEQ tablet Take 1 tablet (10 mEq total) by mouth once. Repeat labs at [facility] the next day Patient not taking: Reported on 01/31/2016 10/10/15 02/10/16  Ginnie SmartHatcher, Jeffrey C, MD    Family History Family History  Problem Relation Age of Onset  . Hypertension Sister   . Hypertension Brother   . Cancer Mother        pancreatic cancer    Social History Social History  Tobacco Use  . Smoking status: Never Smoker  . Smokeless tobacco: Never Used  Substance Use Topics  . Alcohol use: No  . Drug use: No     Allergies   Apple   Review of Systems Review of Systems  Neurological: Negative for headaches.  Ten systems reviewed and are negative for acute change, except as noted in the HPI.    Physical Exam Updated Vital Signs BP (!) 144/98 (BP Location: Left Arm)   Pulse 86   Temp 98.9 F (37.2 C) (Oral)   Resp 18   SpO2 98%   Physical Exam Vitals signs and nursing note reviewed.  Constitutional:      General: She is not in acute distress.     Appearance: She is well-developed. She is not diaphoretic.     Comments: Nontoxic appearing, pleasant.  HENT:     Head: Normocephalic and atraumatic.     Right Ear: Tympanic membrane, ear canal and external ear normal.     Ears:     Comments: Impacted cerumen L ear canal Eyes:     General: No scleral icterus.    Conjunctiva/sclera: Conjunctivae normal.  Neck:     Musculoskeletal: Normal range of motion.  Pulmonary:     Effort: Pulmonary effort is normal. No respiratory distress.     Comments: Respirations even and unlabored Musculoskeletal: Normal range of motion.  Skin:    General: Skin is warm and dry.     Coloration: Skin is not pale.     Findings: No erythema or rash.  Neurological:     Mental Status: She is alert and oriented to person, place, and time.  Psychiatric:        Behavior: Behavior normal.      ED Treatments / Results  Labs (all labs ordered are listed, but only abnormal results are displayed) Labs Reviewed - No data to display  EKG None  Radiology No results found.  Procedures .Ear Cerumen Removal  Date/Time: 06/01/2019 4:18 AM Performed by: Mitchel HonourFlores, Melanie, RN Authorized by: Antony MaduraHumes, Avery Eustice, PA-C   Consent:    Consent obtained:  Verbal   Consent given by:  Patient   Risks discussed:  Pain and dizziness   Alternatives discussed:  Delayed treatment Procedure details:    Location:  L ear   Procedure type: irrigation   Post-procedure details:    Inspection:  TM intact   Hearing quality:  Normal   Patient tolerance of procedure:  Tolerated well, no immediate complications   (including critical care time)  Medications Ordered in ED Medications - No data to display   4:44 AM Clear left ear canal post irrigation. Hearing has improved; fullness resolved.   Initial Impression / Assessment and Plan / ED Course  I have reviewed the triage vital signs and the nursing notes.  Pertinent labs & imaging results that were available during my care  of the patient were reviewed by me and considered in my medical decision making (see chart for details).        Patient presenting for cerumen impaction of the left ear canal.  This was resolved with bedside irrigation.  Hearing has returned to normal.  No signs of associated infection.  Stable for discharge and outpatient primary care follow-up as needed.   Final Clinical Impressions(s) / ED Diagnoses   Final diagnoses:  Impacted cerumen of left ear    ED Discharge Orders    None       Antony MaduraHumes, Sherina Stammer, PA-C 06/01/19 0445  Fatima Blank, MD 06/01/19 [phone]

## 2019-06-01 NOTE — ED Notes (Signed)
Discharge instructions discussed with pt. Pt verbalized understanding. Pt stable and ambulatory. No signature pad available. 

## 2019-06-01 NOTE — ED Triage Notes (Signed)
Pt reports left ear being clogged with wax, hx of same.

## 2019-06-13 ENCOUNTER — Emergency Department (HOSPITAL_COMMUNITY)
Admission: EM | Admit: 2019-06-13 | Discharge: 2019-06-14 | Disposition: A | Discharge status: Home / Self Care | Payer: Self-pay | Attending: Emergency Medicine | Admitting: Emergency Medicine

## 2019-06-13 ENCOUNTER — Other Ambulatory Visit: Payer: Self-pay

## 2019-06-13 ENCOUNTER — Encounter (HOSPITAL_COMMUNITY): Payer: Self-pay | Admitting: Emergency Medicine

## 2019-06-13 ENCOUNTER — Emergency Department (HOSPITAL_COMMUNITY): Payer: Self-pay

## 2019-06-13 DIAGNOSIS — R0602 Shortness of breath: Secondary | ICD-10-CM | POA: Insufficient documentation

## 2019-06-13 DIAGNOSIS — I10 Essential (primary) hypertension: Secondary | ICD-10-CM | POA: Insufficient documentation

## 2019-06-13 DIAGNOSIS — R202 Paresthesia of skin: Secondary | ICD-10-CM | POA: Insufficient documentation

## 2019-06-13 DIAGNOSIS — Z79899 Other long term (current) drug therapy: Secondary | ICD-10-CM | POA: Insufficient documentation

## 2019-06-13 DIAGNOSIS — R002 Palpitations: Secondary | ICD-10-CM | POA: Insufficient documentation

## 2019-06-13 LAB — CBC
HCT: 39.4 % (ref 39.0–52.0)
Hemoglobin: 13.9 g/dL (ref 13.0–17.0)
MCH: 34.1 pg — ABNORMAL HIGH (ref 26.0–34.0)
MCHC: 35.3 g/dL (ref 30.0–36.0)
MCV: 96.6 fL (ref 80.0–100.0)
Platelets: 218 10*3/uL (ref 150–400)
RBC: 4.08 MIL/uL — ABNORMAL LOW (ref 4.22–5.81)
RDW: 12.6 % (ref 11.5–15.5)
WBC: 6.3 10*3/uL (ref 4.0–10.5)
nRBC: 0 % (ref 0.0–0.2)

## 2019-06-13 LAB — BASIC METABOLIC PANEL
Anion gap: 10 (ref 5–15)
BUN: 11 mg/dL (ref 6–20)
CO2: 21 mmol/L — ABNORMAL LOW (ref 22–32)
Calcium: 9 mg/dL (ref 8.9–10.3)
Chloride: 106 mmol/L (ref 98–111)
Creatinine, Ser: 0.82 mg/dL (ref 0.61–1.24)
GFR calc Af Amer: >60 mL/min (ref >60)
GFR calc non Af Amer: >60 mL/min (ref >60)
Glucose, Bld: 127 mg/dL — ABNORMAL HIGH (ref 70–99)
Potassium: 3.2 mmol/L — ABNORMAL LOW (ref 3.5–5.1)
Sodium: 137 mmol/L (ref 135–145)

## 2019-06-13 LAB — TROPONIN I (HIGH SENSITIVITY): Troponin I (High Sensitivity): 4 ng/L (ref <18)

## 2019-06-13 MED ORDER — ONDANSETRON 4 MG PO TBDP
4.0000 mg | ORAL_TABLET | Freq: Once | ORAL | Status: AC
  Administered 2019-06-13: 4 mg via ORAL
  Filled 2019-06-13: qty 1

## 2019-06-13 MED ORDER — SODIUM CHLORIDE 0.9% FLUSH
3.0000 mL | Freq: Once | INTRAVENOUS | Status: AC
  Administered 2019-06-14: 3 mL via INTRAVENOUS

## 2019-06-13 NOTE — ED Triage Notes (Signed)
Patient here with chest discomfort and bilateral arm tingling. He states he just doesn't feel right.  He does have hypertension and slightly hypertensive in triage. Patient did taken ASA x2 of 325mg .

## 2019-06-14 ENCOUNTER — Other Ambulatory Visit: Payer: Self-pay

## 2019-06-14 LAB — TROPONIN I (HIGH SENSITIVITY): Troponin I (High Sensitivity): 7 ng/L (ref <18)

## 2019-06-14 NOTE — ED Provider Notes (Signed)
[facility]   CSN: [id number] Arrival date & time: 06/13/19  2104    History   Chief Complaint Chief Complaint  Patient presents with  . Chest Pain  . Arm Pain    HPI Theordore T Delford Meyers is a 53 y.o. adult.     53 y/o male with hx of HIV (CD4 ~250), HTN, dyslipidemia, hepatitis C presents to the ED for c/o dyspnea. Patient reports waking from a nap this afternoon and having a slight headache.  He took 2 tablets of 325 mg aspirin at 1600.  Began to notice some increased work of breathing shortly after associated with palpitations.  Felt that his heart was beating more rapidly than normal.  Symptoms associated with mild nausea as well as soreness in the left shoulder.  His symptoms have largely subsided.  Did wax and wane while in the waiting room.  He denies ever having had chest pain.  No radiation of his pain to the neck, jaw, back.  Denies diaphoresis, fevers, leg swelling, pleuritic pain, vomiting, extremity numbness or weakness.  Does have a love for spicy foods and hot sauce.  Is not on medications for reflux.  Patient is a never smoker.  Hx of Afib and CHF in mother and CHF in sister.  No known personal or Fhx of ACS, per patient.  The history is provided by the patient. No language interpreter was used.  Chest Pain Arm Pain Associated symptoms include chest pain.    Past Medical History:  Diagnosis Date  . Abnormal finding on GI tract imaging   . Allergy   . Candidiasis   . Hepatitis C   . Herpes iris   . HIV disease (HCC)   . Hypertension   . Hypokalemia   . Plantar nerve lesion   . Rectal bleeding   . Retinitis   . Syncope   . Ulcer   . Venereal wart     Patient Active Problem List   Diagnosis Date Noted  . High triglycerides 12/14/2018  . Health care maintenance 12/14/2018  . 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 Depression 10/05/2015  . Rash and nonspecific skin eruption 10/06/2013  . Dental abscess  06/02/2012  . 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 RETINITIS 07/11/2010  . ALLERGIES-SEASONAL 05/07/2010  . CANDIDIASIS, ORAL 08/21/2009  . ABNORMAL FINDINGS GI TRACT 01/13/2009  . HYPOKALEMIA 12/25/2008  . RECTAL BLEEDING 12/25/2008  . 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 Essential hypertension 03/23/2008  . SYNCOPE 01/12/2007  . Hepatitis C 09/27/2006  . 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 HX, PERSONAL, PAST NONCOMPLIANCE 09/27/2006  . Human immunodeficiency virus (HIV) disease (HCC) 02/06/2006    History reviewed. No pertinent surgical history.      Home Medications    Prior to Admission medications   Medication Sig Start Date End Date Taking? Authorizing Provider  darunavir-cobicistat (PREZCOBIX) 800-150 MG tablet TABLET 1 TABLET BY MOUTH DAILY WITH BREAKFAST. SWALLOW WHOLE. DO NOT CRUSH, BREAK OR CHEW. TAKE WITH FOOD. 12/14/18   Veryl Speakalone, Gregory D, FNP  dolutegravir (TIVICAY) 50 MG tablet Take 1 tablet (50 mg total) by mouth daily. 12/14/18   Veryl Speakalone, Gregory D, FNP  emtricitabine-tenofovir AF (DESCOVY) 200-25 MG tablet Take 1 tablet by mouth daily. 12/14/18   Veryl Speakalone, Gregory D, FNP  HARVONI 90-400 MG TABS TAKE 1 TABLET BY MOUTH DAILY WITH BREAKFAST 11/26/17   Ginnie SmartHatcher, Jeffrey C, MD  lisinopril (PRINIVIL,ZESTRIL) 40 MG tablet Take 1 tablet (  40 mg total) by mouth daily. 01/20/19   Campbell Riches, MD  valACYclovir (VALTREX) 500 MG tablet TAKE 1 TABLET BY MOUTH TWICE DAILY 12/22/18   Campbell Riches, MD  potassium chloride (K-DUR) 10 MEQ tablet Take 1 tablet (10 mEq total) by mouth once. Repeat labs at [facility] the next day Patient not taking: Reported on 01/31/2016 10/10/15 02/10/16  Campbell Riches, MD    Family History Family History  Problem Relation Age of Onset  . Hypertension Sister   . Hypertension Brother   . Cancer Mother        pancreatic cancer    Social History Social History   Tobacco Use  . Smoking status: Never Smoker  . Smokeless  tobacco: Never Used  Substance Use Topics  . Alcohol use: No  . Drug use: No     Allergies   Apple   Review of Systems Review of Systems  Cardiovascular: Positive for chest pain.  Ten systems reviewed and are negative for acute change, except as noted in the HPI.    Physical Exam Updated Vital Signs BP (!) 129/96   Pulse 69   Temp 99 F (37.2 C) (Oral)   Resp 20   SpO2 96%   Physical Exam Vitals signs and nursing note reviewed.  Constitutional:      General: She is not in acute distress.    Appearance: She is well-developed. She is not diaphoretic.     Comments: Nontoxic appearing and in NAD  HENT:     Head: Normocephalic and atraumatic.  Eyes:     General: No scleral icterus.    Conjunctiva/sclera: Conjunctivae normal.  Neck:     Musculoskeletal: Normal range of motion.  Cardiovascular:     Rate and Rhythm: Normal rate and regular rhythm.     Pulses: Normal pulses.  Pulmonary:     Effort: Pulmonary effort is normal. No respiratory distress.     Breath sounds: No stridor. No wheezing or rales.     Comments: Respirations even and unlabored Musculoskeletal: Normal range of motion.  Skin:    General: Skin is warm and dry.     Coloration: Skin is not pale.     Findings: No erythema or rash.  Neurological:     General: No focal deficit present.     Mental Status: She is alert and oriented to person, place, and time.     Coordination: Coordination normal.  Psychiatric:        Behavior: Behavior normal.      ED Treatments / Results  Labs (all labs ordered are listed, but only abnormal results are displayed) Labs Reviewed  BASIC METABOLIC PANEL - Abnormal; Notable for the following components:      Result Value   Potassium 3.2 (*)    CO2 21 (*)    Glucose, Bld 127 (*)    All other components within normal limits  CBC - Abnormal; Notable for the following components:   RBC 4.08 (*)    MCH 34.1 (*)    All other components within normal limits  TROPONIN  I (HIGH SENSITIVITY)  TROPONIN I (HIGH SENSITIVITY)    EKG EKG Interpretation  Date/Time:  Sunday June 13 2019 21:24:00 EDT Ventricular Rate:  79 PR Interval:  174 QRS Duration: 90 QT Interval:  386 QTC Calculation: 442 R Axis:   16 Text Interpretation:  Normal sinus rhythm Nonspecific T wave abnormality Abnormal ECG Artifact No significant change since last tracing Confirmed by Addison Lank (  [id number]) on 06/14/2019 1:26:36 AM   Radiology Dg Chest 2 View  Result Date: 06/13/2019 CLINICAL DATA:  Chest discomfort EXAM: CHEST - 2 VIEW COMPARISON:  02/10/2016 FINDINGS: Low lung volumes. Mild vague opacities in the right upper lobe and bilateral lower lobes, raising concern for mild infection. No pleural effusion or pneumothorax. Heart is top-normal in size. Degenerative changes of the visualized thoracolumbar spine. IMPRESSION: Vague bilateral pulmonary opacities, raising concern for mild infection, although this appearance may be exacerbated by poor inspiration. Electronically Signed   By: Charline BillsSriyesh  Krishnan M.D.   On: 06/13/2019 21:56    Procedures Procedures (including critical care time)  Medications Ordered in ED Medications  sodium chloride flush (NS) 0.9 % injection 3 mL (3 mLs Intravenous Given 06/14/19 0006)  ondansetron (ZOFRAN-ODT) disintegrating tablet 4 mg (4 mg Oral Given 06/13/19 2128)     Initial Impression / Assessment and Plan / ED Course  I have reviewed the triage vital signs and the nursing notes.  Pertinent labs & imaging results that were available during my care of the patient were reviewed by me and considered in my medical decision making (see chart for details).        Patient presents to the emergency department for evaluation of dyspnea with palpitations. Denies associated chest pain. Symptom onset at ~1600 after taking ASA for a headache.  EKG is without signs of acute ischemia and troponin reassuring x 2.  Patient has a heart score of 4, though symptoms  more nonspecific and atypical for ACS.  Chest x-ray shows vague bilateral pulmonary opacities.  This is favored to represent poor inspiration as patient denies any infectious symptoms.  He is afebrile.  No leukocytosis. No evidence of mediastinal widening to suggest dissection.  No pneumothorax, frank pneumonia, pleural effusion.    In light of the patient's moderate risk score, he has been encouraged to follow-up closely with cardiology on an outpatient basis.  He is hemodynamically stable and presently asymptomatic.  I do not feel that he requires further emergent or inpatient evaluation at this time.  He has been encouraged to continue his daily prescribed medications.  Return precautions discussed and provided. Patient discharged in stable condition with no unaddressed concerns.   Final Clinical Impressions(s) / ED Diagnoses   Final diagnoses:  Shortness of breath  Palpitations    ED Discharge Orders    None       Antony MaduraHumes, Kenwood Rosiak, PA-C 06/14/19 0207    Nira Connardama, Pedro Eduardo, MD 06/14/19 [phone]

## 2019-06-14 NOTE — ED Notes (Signed)
Patient verbalizes understanding of discharge instructions. Opportunity for questioning and answers were provided. Armband removed by staff, pt discharged from ED ambulatory.   

## 2019-06-14 NOTE — Discharge Instructions (Signed)
Your work-up in the emergency department was reassuring and did not reveal a concerning cause of your symptoms.  We do advise a follow-up with cardiology and have an outpatient stress test completed.  Follow-up with your primary care doctor as needed.  Continue your daily prescribed medications.  You may return to the ED for any new or concerning symptoms.

## 2019-06-16 ENCOUNTER — Other Ambulatory Visit: Payer: Self-pay | Admitting: Family

## 2019-06-16 ENCOUNTER — Other Ambulatory Visit: Payer: Self-pay | Admitting: Infectious Diseases

## 2019-06-16 DIAGNOSIS — B20 Human immunodeficiency virus [HIV] disease: Secondary | ICD-10-CM

## 2019-06-16 DIAGNOSIS — B009 Herpesviral infection, unspecified: Secondary | ICD-10-CM

## 2019-06-24 ENCOUNTER — Telehealth: Payer: Self-pay

## 2019-06-24 NOTE — Telephone Encounter (Signed)
Patient called to verify his appointment date/time. Information provided and patient was satisfied with call.   Eugenia Mcalpine, LPN

## 2019-06-28 ENCOUNTER — Encounter: Payer: Self-pay | Admitting: Family

## 2019-06-29 ENCOUNTER — Encounter: Payer: Self-pay | Admitting: Family

## 2019-06-29 ENCOUNTER — Other Ambulatory Visit: Payer: Self-pay

## 2019-06-29 ENCOUNTER — Ambulatory Visit (INDEPENDENT_AMBULATORY_CARE_PROVIDER_SITE_OTHER): Payer: Self-pay | Admitting: Family

## 2019-06-29 VITALS — BP 143/96 | HR 97 | Temp 98.6°F

## 2019-06-29 DIAGNOSIS — Z0000 Encounter for general adult medical examination without abnormal findings: Secondary | ICD-10-CM

## 2019-06-29 DIAGNOSIS — Z113 Encounter for screening for infections with a predominantly sexual mode of transmission: Secondary | ICD-10-CM

## 2019-06-29 DIAGNOSIS — I10 Essential (primary) hypertension: Secondary | ICD-10-CM

## 2019-06-29 DIAGNOSIS — B20 Human immunodeficiency virus [HIV] disease: Secondary | ICD-10-CM

## 2019-06-29 MED ORDER — PREZCOBIX 800-150 MG PO TABS: ORAL_TABLET | ORAL | 5 refills | Status: DC

## 2019-06-29 MED ORDER — DESCOVY 200-25 MG PO TABS: 1.0000 | ORAL_TABLET | Freq: Every day | ORAL | 5 refills | Status: DC

## 2019-06-29 MED ORDER — TIVICAY 50 MG PO TABS: ORAL_TABLET | ORAL | 5 refills | Status: DC

## 2019-06-29 NOTE — Patient Instructions (Addendum)
Nice to see you.  We will check your blood work today.  Continue to take your medication as prescribed daily.  Refills have been sent to the pharmacy.  Recommend a flu shot in September.  Work on improving your nutrition - if headaches continue let us know.  Follow up in 6 months or sooner if needed with lab work 1-2 weeks prior to appointment.   Have a great day and stay safe!

## 2019-06-29 NOTE — Assessment & Plan Note (Signed)
   Discussed importance of safe sexual practice to reduce risk of acquisition/transmission of STI.  Declines condoms.  Recommend influenza vaccination in September when available  Due for colon cancer screening through colonoscopy.

## 2019-06-29 NOTE — Assessment & Plan Note (Signed)
Blood pressure slightly elevated today although near goal.  He has been having increasing levels of headaches and may need to consider increasing medication.  Discussed importance of nutritional intake and reducing sugary/processed foods as well as vegetable oils.  No changes in vision today.  Continue current dose of lisinopril.  Encouraged to monitor blood pressure at home as able.

## 2019-06-29 NOTE — Assessment & Plan Note (Signed)
[name] has well-controlled HIV disease with good adherence and tolerance to his ART regimen of Prezcobix, Tivicay, and Descovy.  No signs/symptoms of opportunistic infection or progressive HIV disease at present.  He has no problems obtaining medication from the pharmacy.  Financial assistance renewed today.  Continue current dose of Tivicay, Descovy, and Prezcobix.  Plan for follow-up in 6 months or sooner if needed with lab work 1 to 2 weeks prior to appointment or on the same day.

## 2019-06-29 NOTE — Progress Notes (Signed)
Subjective:    Patient ID: [name], adult    DOB: 08/11/1966, 53 y.o.   MRN: [id number]  Chief Complaint  Patient presents with  . Follow-up     HPI:  [name] is a 53 y.o. adult with HIV disease who was last seen in the office on 12/14/2018 with good adherence and tolerance to his ART regimen of Tivicay/Descovy/Prezcobix. Viral load at the time was 41 with CD4 count of 250.    [name] continues to take Tivicay/Descovy/Prezcobix as prescribed with no adverse side effects or missed doses. Overall feeling well. Did have some elevated blood pressure recently. Denies fevers, chills, night sweats, headaches, changes in vision, neck pain/stiffness, nausea, diarrhea, vomiting, lesions or rashes.  [name] remains covered through [employer] with no problems obtaining his medication from the pharmacy. Denies feelings of being down, depressed or hopelessness.  No recreational or illicit drug use, tobacco use, or alcohol consumption currently continues to work full-time.  Not currently sexually active but uses condoms when he is.  No new partners recently.   Continue to work in [employer]. Allergies  Allergen Reactions  . Apple Swelling      Outpatient Medications Prior to Visit  Medication Sig Dispense Refill  . HARVONI 90-400 MG TABS TAKE 1 TABLET BY MOUTH DAILY WITH BREAKFAST 28 tablet 0  . lisinopril (PRINIVIL,ZESTRIL) 40 MG tablet Take 1 tablet (40 mg total) by mouth daily. 30 tablet 5  . valACYclovir (VALTREX) 500 MG tablet TAKE 1 TABLET BY MOUTH TWICE DAILY 60 tablet 5  . DESCOVY 200-25 MG tablet TAKE 1 TABLET BY MOUTH DAILY 30 tablet 5  . PREZCOBIX 800-150 MG tablet TAKE 1 TABLET BY MOUTH DAILY WITH BREAKFAST. SWALLOW WHOLE. DO NOT CRUSH, BREAK OR CHEW. TAKE WITH FOOD 30 tablet 5  . TIVICAY 50 MG tablet TAKE 1 TABLET(50 MG) BY MOUTH DAILY 30 tablet 5   No facility-administered medications prior to visit.      Past Medical History:  Diagnosis Date  .  Abnormal finding on GI tract imaging   . Allergy   . Candidiasis   . Hepatitis C   . Herpes iris   . HIV disease (HCC)   . Hypertension   . Hypokalemia   . Plantar nerve lesion   . Rectal bleeding   . Retinitis   . Syncope   . Ulcer   . Venereal wart      History reviewed. No pertinent surgical history.     Review of Systems  Constitutional: Negative for appetite change, chills, diaphoresis, fatigue, fever and unexpected weight change.  Eyes:       Negative for acute change in vision  Respiratory: Negative for chest tightness, shortness of breath and wheezing.   Cardiovascular: Negative for chest pain.  Gastrointestinal: Negative for diarrhea, nausea and vomiting.  Genitourinary: Negative for dysuria.  Musculoskeletal: Negative for neck pain and neck stiffness.  Skin: Negative for rash.  Neurological: Negative for seizures, syncope, weakness and headaches.  Hematological: Negative for adenopathy. Does not bruise/bleed easily.  Psychiatric/Behavioral: Negative for hallucinations.      Objective:    BP (!) 143/96   Pulse 97   Temp 98.6 F (37 C) (Oral)  Nursing note and vital signs reviewed.  Physical Exam Constitutional:      General: She is not in acute distress.    Appearance: She is well-developed.  Eyes:     Conjunctiva/sclera: Conjunctivae normal.  Neck:     Musculoskeletal: Neck  supple.  Cardiovascular:     Rate and Rhythm: Normal rate and regular rhythm.     Heart sounds: Normal heart sounds. No murmur. No friction rub. No gallop.   Pulmonary:     Effort: Pulmonary effort is normal. No respiratory distress.     Breath sounds: Normal breath sounds. No wheezing or rales.  Chest:     Chest wall: No tenderness.  Abdominal:     General: Bowel sounds are normal.     Palpations: Abdomen is soft.     Tenderness: There is no abdominal tenderness.  Lymphadenopathy:     Cervical: No cervical adenopathy.  Skin:    General: Skin is warm and dry.      Findings: No rash.  Neurological:     Mental Status: She is alert and oriented to person, place, and time.  Psychiatric:        Behavior: Behavior normal.        Thought Content: Thought content normal.        Judgment: Judgment normal.      Depression screen [facility] 2/9 06/29/2019 09/01/2017 01/16/2015 02/16/2014 10/06/2013  Decreased Interest 0 0 0 0 0  Down, Depressed, Hopeless 0 0 0 0 0  PHQ - 2 Score 0 0 0 0 0       Assessment & Plan:   Problem List Items Addressed This Visit      Cardiovascular and Mediastinum   Essential hypertension    Blood pressure slightly elevated today although near goal.  He has been having increasing levels of headaches and may need to consider increasing medication.  Discussed importance of nutritional intake and reducing sugary/processed foods as well as vegetable oils.  No changes in vision today.  Continue current dose of lisinopril.  Encouraged to monitor blood pressure at home as able.        Other   Human immunodeficiency virus (HIV) disease ([place]) - Primary    [name] has well-controlled HIV disease with good adherence and tolerance to his ART regimen of Prezcobix, Tivicay, and Descovy.  No signs/symptoms of opportunistic infection or progressive HIV disease at present.  He has no problems obtaining medication from the pharmacy.  Financial assistance renewed today.  Continue current dose of Tivicay, Descovy, and Prezcobix.  Plan for follow-up in 6 months or sooner if needed with lab work 1 to 2 weeks prior to appointment or on the same day.      Relevant Medications   emtricitabine-tenofovir AF (DESCOVY) 200-25 MG tablet   darunavir-cobicistat (PREZCOBIX) 800-150 MG tablet   dolutegravir (TIVICAY) 50 MG tablet   Other Relevant Orders   COMPLETE METABOLIC PANEL WITH GFR   HIV-1 RNA quant-no reflex-bld   T-helper cell (CD4)- ([facility] clinic only)   Health care maintenance     Discussed importance of safe sexual practice to reduce risk of  acquisition/transmission of STI.  Declines condoms.  Recommend influenza vaccination in September when available  Due for colon cancer screening through colonoscopy.       Other Visit Diagnoses    Screening for STDs (sexually transmitted diseases)       Relevant Orders   RPR       I have changed [name]'s Descovy, Prezcobix, and Tivicay. I am also having her maintain her Harvoni, lisinopril, and valACYclovir.   Meds ordered this encounter  Medications  . emtricitabine-tenofovir AF (DESCOVY) 200-25 MG tablet    Sig: Take 1 tablet by mouth daily.    Dispense:  30 tablet    Refill:  5    Order Specific Question:   Supervising Provider    Answer:   Judyann MunsonSNIDER, CYNTHIA [[id number]]  . darunavir-cobicistat (PREZCOBIX) 800-150 MG tablet    Sig: TAKE 1 TABLET BY MOUTH DAILY WITH BREAKFAST. SWALLOW WHOLE. DO NOT CRUSH, BREAK OR CHEW. TAKE WITH FOOD    Dispense:  30 tablet    Refill:  5    Order Specific Question:   Supervising Provider    Answer:   Judyann MunsonSNIDER, CYNTHIA [[id number]]  . dolutegravir (TIVICAY) 50 MG tablet    Sig: TAKE 1 TABLET(50 MG) BY MOUTH DAILY    Dispense:  30 tablet    Refill:  5    Order Specific Question:   Supervising Provider    Answer:   Judyann MunsonSNIDER, CYNTHIA [[id number]]     Follow-up: Return in about 6 months (around 12/30/2019).   Marcos EkeGreg Noelie Renfrow, MSN, FNP-C Nurse Practitioner [facility] Main number: [phone]

## 2019-06-30 LAB — T-HELPER CELL (CD4) - (RCID CLINIC ONLY)
CD4 % Helper T Cell: 15 % — ABNORMAL LOW (ref 33–65)
CD4 T Cell Abs: 196 /uL — ABNORMAL LOW (ref 400–1790)

## 2019-07-02 LAB — COMPLETE METABOLIC PANEL WITH GFR
AG Ratio: 1.5 (calc) (ref 1.0–2.5)
ALT: 12 U/L (ref 9–46)
AST: 15 U/L (ref 10–35)
Albumin: 4.4 g/dL (ref 3.6–5.1)
Alkaline phosphatase (APISO): 66 U/L (ref 35–144)
BUN: 10 mg/dL (ref 7–25)
CO2: 20 mmol/L (ref 20–32)
Calcium: 9.7 mg/dL (ref 8.6–10.3)
Chloride: 108 mmol/L (ref 98–110)
Creat: 0.86 mg/dL (ref 0.70–1.33)
GFR, Est African American: 115 mL/min/{1.73_m2} (ref >=60)
GFR, Est Non African American: 99 mL/min/{1.73_m2} (ref >=60)
Globulin: 3 g/dL (calc) (ref 1.9–3.7)
Glucose, Bld: 92 mg/dL (ref 65–99)
Potassium: 3.9 mmol/L (ref 3.5–5.3)
Sodium: 139 mmol/L (ref 135–146)
Total Bilirubin: 0.3 mg/dL (ref 0.2–1.2)
Total Protein: 7.4 g/dL (ref 6.1–8.1)

## 2019-07-02 LAB — RPR: RPR Ser Ql: NONREACTIVE

## 2019-07-02 LAB — HIV-1 RNA QUANT-NO REFLEX-BLD
HIV 1 RNA Quant: <20 copies/mL — AB
HIV-1 RNA Quant, Log: <1.3 Log copies/mL — AB

## 2019-07-16 ENCOUNTER — Other Ambulatory Visit: Payer: Self-pay | Admitting: Infectious Diseases

## 2019-07-16 DIAGNOSIS — B20 Human immunodeficiency virus [HIV] disease: Secondary | ICD-10-CM

## 2019-07-16 DIAGNOSIS — I10 Essential (primary) hypertension: Secondary | ICD-10-CM

## 2019-07-22 ENCOUNTER — Encounter: Payer: Self-pay | Admitting: Family

## 2019-08-11 ENCOUNTER — Ambulatory Visit: Payer: Self-pay

## 2019-11-30 ENCOUNTER — Other Ambulatory Visit: Payer: Self-pay

## 2019-11-30 ENCOUNTER — Ambulatory Visit: Payer: Self-pay

## 2019-12-01 ENCOUNTER — Encounter: Payer: Self-pay | Admitting: Family

## 2019-12-10 ENCOUNTER — Encounter (HOSPITAL_COMMUNITY): Payer: Self-pay | Admitting: Emergency Medicine

## 2019-12-10 ENCOUNTER — Other Ambulatory Visit: Payer: Self-pay

## 2019-12-10 ENCOUNTER — Emergency Department (HOSPITAL_COMMUNITY): Payer: Self-pay

## 2019-12-10 ENCOUNTER — Emergency Department (HOSPITAL_COMMUNITY)
Admission: EM | Admit: 2019-12-10 | Discharge: 2019-12-10 | Disposition: A | Discharge status: Home / Self Care | Payer: Self-pay | Attending: Emergency Medicine | Admitting: Emergency Medicine

## 2019-12-10 DIAGNOSIS — I160 Hypertensive urgency: Secondary | ICD-10-CM | POA: Insufficient documentation

## 2019-12-10 DIAGNOSIS — R200 Anesthesia of skin: Secondary | ICD-10-CM | POA: Insufficient documentation

## 2019-12-10 DIAGNOSIS — B20 Human immunodeficiency virus [HIV] disease: Secondary | ICD-10-CM | POA: Insufficient documentation

## 2019-12-10 DIAGNOSIS — R519 Headache, unspecified: Secondary | ICD-10-CM

## 2019-12-10 DIAGNOSIS — Z79899 Other long term (current) drug therapy: Secondary | ICD-10-CM | POA: Insufficient documentation

## 2019-12-10 LAB — COMPREHENSIVE METABOLIC PANEL
ALT: 16 U/L (ref 0–44)
AST: 21 U/L (ref 15–41)
Albumin: 4.3 g/dL (ref 3.5–5.0)
Alkaline Phosphatase: 59 U/L (ref 38–126)
Anion gap: 9 (ref 5–15)
BUN: 9 mg/dL (ref 6–20)
CO2: 23 mmol/L (ref 22–32)
Calcium: 9.3 mg/dL (ref 8.9–10.3)
Chloride: 105 mmol/L (ref 98–111)
Creatinine, Ser: 0.89 mg/dL (ref 0.61–1.24)
GFR calc Af Amer: >60 mL/min (ref >60)
GFR calc non Af Amer: >60 mL/min (ref >60)
Glucose, Bld: 96 mg/dL (ref 70–99)
Potassium: 3.7 mmol/L (ref 3.5–5.1)
Sodium: 137 mmol/L (ref 135–145)
Total Bilirubin: 1 mg/dL (ref 0.3–1.2)
Total Protein: 7.7 g/dL (ref 6.5–8.1)

## 2019-12-10 LAB — CBC WITH DIFFERENTIAL/PLATELET
Abs Immature Granulocytes: 0 10*3/uL (ref 0.00–0.07)
Basophils Absolute: 0 10*3/uL (ref 0.0–0.1)
Basophils Relative: 0 %
Eosinophils Absolute: 0 10*3/uL (ref 0.0–0.5)
Eosinophils Relative: 1 %
HCT: 42.2 % (ref 39.0–52.0)
Hemoglobin: 14.8 g/dL (ref 13.0–17.0)
Immature Granulocytes: 0 %
Lymphocytes Relative: 32 %
Lymphs Abs: 1.2 10*3/uL (ref 0.7–4.0)
MCH: 34.7 pg — ABNORMAL HIGH (ref 26.0–34.0)
MCHC: 35.1 g/dL (ref 30.0–36.0)
MCV: 98.8 fL (ref 80.0–100.0)
Monocytes Absolute: 0.3 10*3/uL (ref 0.1–1.0)
Monocytes Relative: 9 %
Neutro Abs: 2.1 10*3/uL (ref 1.7–7.7)
Neutrophils Relative %: 58 %
Platelets: 239 10*3/uL (ref 150–400)
RBC: 4.27 MIL/uL (ref 4.22–5.81)
RDW: 11.9 % (ref 11.5–15.5)
WBC: 3.7 10*3/uL — ABNORMAL LOW (ref 4.0–10.5)
nRBC: 0 % (ref 0.0–0.2)

## 2019-12-10 LAB — URINALYSIS, ROUTINE W REFLEX MICROSCOPIC
Bilirubin Urine: NEGATIVE
Glucose, UA: NEGATIVE mg/dL
Hgb urine dipstick: NEGATIVE
Ketones, ur: 5 mg/dL — AB
Leukocytes,Ua: NEGATIVE
Nitrite: NEGATIVE
Protein, ur: NEGATIVE mg/dL
Specific Gravity, Urine: 1.016 (ref 1.005–1.030)
pH: 5 (ref 5.0–8.0)

## 2019-12-10 LAB — SEDIMENTATION RATE: Sed Rate: 11 mm/hr (ref 0–16)

## 2019-12-10 MED ORDER — ACETAMINOPHEN 500 MG PO TABS
1000.0000 mg | ORAL_TABLET | Freq: Once | ORAL | Status: AC
  Administered 2019-12-10: 09:00:00 1000 mg via ORAL
  Filled 2019-12-10: qty 2

## 2019-12-10 NOTE — ED Notes (Signed)
Pt is NSR on monitor 

## 2019-12-10 NOTE — ED Provider Notes (Signed)
[facility] EMERGENCY DEPARTMENT Provider Note   CSN: [id number] Arrival date & time: 12/10/19  8657     History Chief Complaint  Patient presents with  . Hypertension    [name] is a 54 y.o. adult.  HPI     Patient with multiple medical issues including HIV, hypertension now presents with concern of headache, numbness, elevated blood pressure. He states that he has been taking his medication regularly, with no dosage change, frequency change.  6 days ago, without clear precipitant he began noticing mild headache, temporal bilateral, superior, sore, mild, nonradiating. No associated vision changes, weakness in any extremity, but he does describe different sensation almost like numbness in both upper extremities distally. No confusion, disorientation, no chest pain, no dyspnea (he did describe this to the triage nurse). He notes 1 prior similar episode months ago with reassuring evaluation. Since onset last week he has taken no medication for pain relief, there are no clear alleviating or exacerbating factors, severity is waxing, waning.   Past Medical History:  Diagnosis Date  . Abnormal finding on GI tract imaging   . Allergy   . Candidiasis   . Hepatitis C   . Herpes iris   . HIV disease (HCC)   . Hypertension   . Hypokalemia   . Plantar nerve lesion   . Rectal bleeding   . Retinitis   . Syncope   . Ulcer   . Venereal wart     Patient Active Problem List   Diagnosis Date Noted  . High triglycerides 12/14/2018  . Health care maintenance 12/14/2018  . 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 Depression 10/05/2015  . Rash and nonspecific skin eruption 10/06/2013  . Dental abscess 06/02/2012  . 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 RETINITIS 07/11/2010  . ALLERGIES-SEASONAL 05/07/2010  . CANDIDIASIS, ORAL 08/21/2009  . ABNORMAL FINDINGS GI TRACT 01/13/2009  . HYPOKALEMIA 12/25/2008  . RECTAL BLEEDING 12/25/2008  . 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 Essential hypertension 03/23/2008  . SYNCOPE 01/12/2007  . Hepatitis C 09/27/2006  . 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 HX, PERSONAL, PAST NONCOMPLIANCE 09/27/2006  . Human immunodeficiency virus (HIV) disease (HCC) 02/06/2006    History reviewed. No pertinent surgical history.     Family History  Problem Relation Age of Onset  . Hypertension Sister   . Hypertension Brother   . Cancer Mother        pancreatic cancer    Social History   Tobacco Use  . Smoking status: Never Smoker  . Smokeless tobacco: Never Used  Substance Use Topics  . Alcohol use: No  . Drug use: No    Home Medications Prior to Admission medications   Medication Sig Start Date End Date Taking? Authorizing Provider  darunavir-cobicistat (PREZCOBIX) 800-150 MG tablet TAKE 1 TABLET BY MOUTH DAILY WITH BREAKFAST. SWALLOW WHOLE. DO NOT CRUSH, BREAK OR CHEW. TAKE WITH FOOD 06/29/19   Veryl Speak, FNP  dolutegravir (TIVICAY) 50 MG tablet TAKE 1 TABLET(50 MG) BY MOUTH DAILY 06/29/19   Veryl Speak, FNP  emtricitabine-tenofovir AF (DESCOVY) 200-25 MG tablet Take 1 tablet by mouth daily. 06/29/19   Veryl Speak, FNP  HARVONI 90-400 MG TABS TAKE 1 TABLET BY MOUTH DAILY WITH BREAKFAST 11/26/17   Ginnie Smart, MD  lisinopril (ZESTRIL) 40 MG tablet TAKE 1 TABLET(40 MG) BY MOUTH DAILY 07/19/19   Ginnie Smart, MD  valACYclovir (VALTREX) 500 MG tablet TAKE 1 TABLET  BY MOUTH TWICE DAILY 06/16/19   Campbell Riches, MD  potassium chloride (K-DUR) 10 MEQ tablet Take 1 tablet (10 mEq total) by mouth once. Repeat labs at [facility] the next day Patient not taking: Reported on 01/31/2016 10/10/15 02/10/16  Campbell Riches, MD    Allergies    Apple  Review of Systems   Review of Systems  Constitutional:       Per HPI, otherwise negative  HENT:       Per HPI, otherwise negative  Respiratory:       Per HPI, otherwise negative  Cardiovascular:       Per HPI, otherwise  negative  Gastrointestinal: Negative for vomiting.  Endocrine:       Negative aside from HPI  Genitourinary:       Neg aside from HPI   Musculoskeletal:       Per HPI, otherwise negative  Skin: Negative.   Allergic/Immunologic: Positive for immunocompromised state.  Neurological: Positive for numbness and headaches. Negative for syncope.    Physical Exam Updated Vital Signs BP 118/77   Pulse 62   Temp 99.2 F (37.3 C) (Oral)   Resp 18   SpO2 99%   Physical Exam Vitals and nursing note reviewed.  Constitutional:      General: She is not in acute distress.    Appearance: She is well-developed.  HENT:     Head: Normocephalic and atraumatic.  Eyes:     Conjunctiva/sclera: Conjunctivae normal.  Cardiovascular:     Rate and Rhythm: Normal rate and regular rhythm.  Pulmonary:     Effort: Pulmonary effort is normal. No respiratory distress.     Breath sounds: No stridor.  Abdominal:     General: There is no distension.  Skin:    General: Skin is warm and dry.  Neurological:     Mental Status: She is alert and oriented to person, place, and time.     Motor: No weakness, tremor, atrophy, abnormal muscle tone or pronator drift.     Coordination: Coordination normal.     ED Results / Procedures / Treatments   Labs (all labs ordered are listed, but only abnormal results are displayed) Labs Reviewed  URINALYSIS, ROUTINE W REFLEX MICROSCOPIC - Abnormal; Notable for the following components:      Result Value   Ketones, ur 5 (*)    All other components within normal limits  CBC WITH DIFFERENTIAL/PLATELET - Abnormal; Notable for the following components:   WBC 3.7 (*)    MCH 34.7 (*)    All other components within normal limits  COMPREHENSIVE METABOLIC PANEL  SEDIMENTATION RATE    EKG EKG Interpretation  Date/Time:  Friday December 10 2019 09:44:12 EST Ventricular Rate:  75 PR Interval:    QRS Duration: 99 QT Interval:  391 QTC Calculation: 437 R Axis:   3 Text  Interpretation: Sinus rhythm Artifact Otherwise within normal limits Confirmed by Carmin Muskrat [phone]) on 12/10/2019 9:48:02 AM   Radiology DG Chest Port 1 View  Result Date: 12/10/2019 CLINICAL DATA:  Hypertension EXAM: PORTABLE CHEST 1 VIEW COMPARISON:  06/13/2019 FINDINGS: Cardiomegaly. Both lungs are clear. The visualized skeletal structures are unremarkable. IMPRESSION: Cardiomegaly without acute abnormality of the lungs in AP portable projection. Electronically Signed   By: Eddie Candle M.D.   On: 12/10/2019 10:11    Procedures Procedures (including critical care time)  Medications Ordered in ED Medications  acetaminophen (TYLENOL) tablet 1,000 mg (1,000 mg Oral Given 12/10/19 0913)  ED Course  I have reviewed the triage vital signs and the nursing notes.  Pertinent labs & imaging results that were available during my care of the patient were reviewed by me and considered in my medical decision making (see chart for details).    MDM Rules/Calculators/A&P                      12:02 PM Patient awake, alert, in no distress, sitting upright.  Vital signs unremarkable.  He is speaking clearly, we discussed all findings including reassuring labs, x-ray, EKG. No evidence for endorgan damage from hypertensive urgency. No neurologic deficiencies suggesting stroke or CNS pathology.  No fever, overt infectious symptoms. We discussed the importance of following up with his primary care physician which he confirms he will do. We discussed possibility of occult Covid patient defers this test offer. Absent other complaints, with complete blood pressure, symptoms, the patient will follow up with primary care.  Final Clinical Impression(s) / ED Diagnoses Final diagnoses:  Hypertensive urgency  Bad headache      Gerhard Munch, MD 12/10/19 1204

## 2019-12-10 NOTE — ED Triage Notes (Signed)
Pt reports hypertension since Wednesday. Endorses pain in top of head. Hx of HTN and has not missed any medications. Reports some SOB when his BP is elevated but none at this time.

## 2019-12-10 NOTE — Discharge Instructions (Addendum)
As discussed, your evaluation today has been largely reassuring.  But, it is important that you monitor your condition carefully, and do not hesitate to return to the ED if you develop new, or concerning changes in your condition. ? ?Otherwise, please follow-up with your physician for appropriate ongoing care. ? ?

## 2019-12-12 ENCOUNTER — Encounter (HOSPITAL_COMMUNITY): Payer: Self-pay | Admitting: Emergency Medicine

## 2019-12-12 ENCOUNTER — Emergency Department (HOSPITAL_COMMUNITY)
Admission: EM | Admit: 2019-12-12 | Discharge: 2019-12-12 | Disposition: A | Discharge status: Home / Self Care | Payer: Self-pay | Attending: Emergency Medicine | Admitting: Emergency Medicine

## 2019-12-12 ENCOUNTER — Other Ambulatory Visit: Payer: Self-pay

## 2019-12-12 DIAGNOSIS — R519 Headache, unspecified: Secondary | ICD-10-CM | POA: Insufficient documentation

## 2019-12-12 DIAGNOSIS — Z79899 Other long term (current) drug therapy: Secondary | ICD-10-CM | POA: Insufficient documentation

## 2019-12-12 DIAGNOSIS — B20 Human immunodeficiency virus [HIV] disease: Secondary | ICD-10-CM | POA: Insufficient documentation

## 2019-12-12 DIAGNOSIS — I10 Essential (primary) hypertension: Secondary | ICD-10-CM | POA: Insufficient documentation

## 2019-12-12 NOTE — Discharge Instructions (Addendum)
Follow-up with your primary doctor as you may need adjustments to your blood pressure medication if your blood pressure persistently is high. Return for chest pain, shortness of breath, stroke symptoms or new concerns.

## 2019-12-12 NOTE — ED Provider Notes (Signed)
[facility] EMERGENCY DEPARTMENT Provider Note   CSN: [id number] Arrival date & time: 12/12/19  1818     History Chief Complaint  Patient presents with  . Hypertension    Gearald T Cremer is a 54 y.o. adult.  Patient with history of high blood pressure compliant with medication, HIV, hepatitis presents with elevated blood pressure since he had chitlins this evening.  Patient describes fried or boiled and test dines with spices.  Patient with time has gradually felt better.  Patient in mild pressure frontal head that his mostly resolved.  No neurologic symptoms.  No chest pain or shortness of breath.  Patient has outpatient follow-up.        Past Medical History:  Diagnosis Date  . Abnormal finding on GI tract imaging   . Allergy   . Candidiasis   . Hepatitis C   . Herpes iris   . HIV disease (HCC)   . Hypertension   . Hypokalemia   . Plantar nerve lesion   . Rectal bleeding   . Retinitis   . Syncope   . Ulcer   . Venereal wart     Patient Active Problem List   Diagnosis Date Noted  . High triglycerides 12/14/2018  . Health care maintenance 12/14/2018  . 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 Depression 10/05/2015  . Rash and nonspecific skin eruption 10/06/2013  . Dental abscess 06/02/2012  . 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 RETINITIS 07/11/2010  . ALLERGIES-SEASONAL 05/07/2010  . CANDIDIASIS, ORAL 08/21/2009  . ABNORMAL FINDINGS GI TRACT 01/13/2009  . HYPOKALEMIA 12/25/2008  . RECTAL BLEEDING 12/25/2008  . 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 Essential hypertension 03/23/2008  . SYNCOPE 01/12/2007  . Hepatitis C 09/27/2006  . 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 HX, PERSONAL, PAST NONCOMPLIANCE 09/27/2006  . Human immunodeficiency virus (HIV) disease (HCC) 02/06/2006    History reviewed. No pertinent surgical history.     Family History  Problem Relation Age of Onset  . Hypertension Sister   . Hypertension  Brother   . Cancer Mother        pancreatic cancer    Social History   Tobacco Use  . Smoking status: Never Smoker  . Smokeless tobacco: Never Used  Substance Use Topics  . Alcohol use: No  . Drug use: No    Home Medications Prior to Admission medications   Medication Sig Start Date End Date Taking? Authorizing Provider  darunavir-cobicistat (PREZCOBIX) 800-150 MG tablet TAKE 1 TABLET BY MOUTH DAILY WITH BREAKFAST. SWALLOW WHOLE. DO NOT CRUSH, BREAK OR CHEW. TAKE WITH FOOD 06/29/19   Veryl Speak, FNP  dolutegravir (TIVICAY) 50 MG tablet TAKE 1 TABLET(50 MG) BY MOUTH DAILY 06/29/19   Veryl Speak, FNP  emtricitabine-tenofovir AF (DESCOVY) 200-25 MG tablet Take 1 tablet by mouth daily. 06/29/19   Veryl Speak, FNP  HARVONI 90-400 MG TABS TAKE 1 TABLET BY MOUTH DAILY WITH BREAKFAST 11/26/17   Ginnie Smart, MD  lisinopril (ZESTRIL) 40 MG tablet TAKE 1 TABLET(40 MG) BY MOUTH DAILY 07/19/19   Ginnie Smart, MD  valACYclovir (VALTREX) 500 MG tablet TAKE 1 TABLET BY MOUTH TWICE DAILY 06/16/19   Ginnie Smart, MD  potassium chloride (K-DUR) 10 MEQ tablet Take 1 tablet (10 mEq total) by mouth once. Repeat labs at [facility] the next day Patient not taking: Reported on 01/31/2016 10/10/15 02/10/16  Ginnie Smart, MD    Allergies    Apple  Review of Systems   Review of Systems  Constitutional: Negative for chills and fever.  HENT: Negative for congestion.   Eyes: Negative for visual disturbance.  Respiratory: Negative for shortness of breath.   Cardiovascular: Negative for chest pain.  Gastrointestinal: Negative for abdominal pain and vomiting.  Genitourinary: Negative for dysuria and flank pain.  Musculoskeletal: Negative for back pain, neck pain and neck stiffness.  Skin: Negative for rash.  Neurological: Negative for light-headedness and headaches.    Physical Exam Updated Vital Signs BP (!) 155/100   Pulse 72   Temp 98.4 F (36.9 C) (Oral)   Resp 18    SpO2 98%   Physical Exam Vitals and nursing note reviewed.  Constitutional:      Appearance: She is well-developed.  HENT:     Head: Normocephalic and atraumatic.  Eyes:     General:        Right eye: No discharge.        Left eye: No discharge.     Conjunctiva/sclera: Conjunctivae normal.  Neck:     Trachea: No tracheal deviation.  Cardiovascular:     Rate and Rhythm: Normal rate and regular rhythm.  Pulmonary:     Effort: Pulmonary effort is normal.     Breath sounds: Normal breath sounds.  Abdominal:     General: There is no distension.     Palpations: Abdomen is soft.     Tenderness: There is no abdominal tenderness. There is no guarding.  Musculoskeletal:     Cervical back: Normal range of motion and neck supple.  Skin:    General: Skin is warm.     Capillary Refill: Capillary refill takes less than 2 seconds.     Findings: No rash.  Neurological:     Mental Status: She is alert and oriented to person, place, and time.  Psychiatric:        Mood and Affect: Mood normal.     ED Results / Procedures / Treatments   Labs (all labs ordered are listed, but only abnormal results are displayed) Labs Reviewed - No data to display  EKG None  Radiology No results found.  Procedures Procedures (including critical care time)  Medications Ordered in ED Medications - No data to display  ED Course  I have reviewed the triage vital signs and the nursing notes.  Pertinent labs & imaging results that were available during my care of the patient were reviewed by me and considered in my medical decision making (see chart for details).    MDM Rules/Calculators/A&P                      Well-appearing patient with high blood pressure history presents with elevated blood pressure since eating a salty meal.  Patient has no signs or symptoms of endorgan damage at this time.  No chest pain or shortness of breath.  Patient blood pressure in the room around 413 systolic.   Discussed reasons return outpatient follow-up.  No testing indicated at this time. Final Clinical Impression(s) / ED Diagnoses Final diagnoses:  Essential hypertension  Pressure in head    Rx / DC Orders ED Discharge Orders    None       Elnora Morrison, MD 12/12/19 2009

## 2019-12-12 NOTE — ED Triage Notes (Signed)
Pt here for HTN. Seen here Friday for same. States it might be due to his diet today.

## 2019-12-12 NOTE — ED Notes (Signed)
Patient verbalizes understanding of discharge instructions. Opportunity for questioning and answers were provided. Armband removed by staff, pt discharged from ED ambulatory to home.  

## 2019-12-15 ENCOUNTER — Telehealth: Payer: Self-pay

## 2019-12-15 NOTE — Telephone Encounter (Signed)
Patient called office today requesting appointment with office after visiting ED twice regarding blood pressure. Advised patient she should follow up with PCP regarding BP concerns. Provided patient with 3 primary care office numbers.  Lorenso Courier, [place]

## 2019-12-22 ENCOUNTER — Other Ambulatory Visit: Payer: Self-pay

## 2019-12-22 DIAGNOSIS — B20 Human immunodeficiency virus [HIV] disease: Secondary | ICD-10-CM

## 2019-12-22 DIAGNOSIS — Z79899 Other long term (current) drug therapy: Secondary | ICD-10-CM

## 2019-12-22 DIAGNOSIS — Z113 Encounter for screening for infections with a predominantly sexual mode of transmission: Secondary | ICD-10-CM

## 2019-12-27 ENCOUNTER — Other Ambulatory Visit: Payer: Self-pay

## 2019-12-27 ENCOUNTER — Other Ambulatory Visit: Payer: Self-pay | Admitting: Infectious Diseases

## 2019-12-27 DIAGNOSIS — B20 Human immunodeficiency virus [HIV] disease: Secondary | ICD-10-CM

## 2019-12-27 DIAGNOSIS — Z113 Encounter for screening for infections with a predominantly sexual mode of transmission: Secondary | ICD-10-CM

## 2019-12-27 DIAGNOSIS — Z79899 Other long term (current) drug therapy: Secondary | ICD-10-CM

## 2019-12-27 DIAGNOSIS — B009 Herpesviral infection, unspecified: Secondary | ICD-10-CM

## 2019-12-28 LAB — T-HELPER CELL (CD4) - (RCID CLINIC ONLY)
CD4 % Helper T Cell: 15 % — ABNORMAL LOW (ref 33–65)
CD4 T Cell Abs: 201 /uL — ABNORMAL LOW (ref 400–1790)

## 2019-12-30 LAB — COMPREHENSIVE METABOLIC PANEL
AG Ratio: 1.6 (calc) (ref 1.0–2.5)
ALT: 14 U/L (ref 9–46)
AST: 14 U/L (ref 10–35)
Albumin: 4.4 g/dL (ref 3.6–5.1)
Alkaline phosphatase (APISO): 57 U/L (ref 35–144)
BUN: 9 mg/dL (ref 7–25)
CO2: 24 mmol/L (ref 20–32)
Calcium: 9.5 mg/dL (ref 8.6–10.3)
Chloride: 105 mmol/L (ref 98–110)
Creat: 0.8 mg/dL (ref 0.70–1.33)
Globulin: 2.7 g/dL (calc) (ref 1.9–3.7)
Glucose, Bld: 92 mg/dL (ref 65–99)
Potassium: 3.7 mmol/L (ref 3.5–5.3)
Sodium: 138 mmol/L (ref 135–146)
Total Bilirubin: 0.3 mg/dL (ref 0.2–1.2)
Total Protein: 7.1 g/dL (ref 6.1–8.1)

## 2019-12-30 LAB — LIPID PANEL
Cholesterol: 170 mg/dL (ref <200)
HDL: 37 mg/dL — ABNORMAL LOW (ref >=40)
LDL Cholesterol (Calc): 94 mg/dL (calc)
Non-HDL Cholesterol (Calc): 133 mg/dL (calc) — ABNORMAL HIGH (ref <130)
Total CHOL/HDL Ratio: 4.6 (calc) (ref <5.0)
Triglycerides: 270 mg/dL — ABNORMAL HIGH (ref <150)

## 2019-12-30 LAB — CBC WITH DIFFERENTIAL/PLATELET
Absolute Monocytes: 396 cells/uL (ref 200–950)
Basophils Absolute: 0 cells/uL (ref 0–200)
Basophils Relative: 0 %
Eosinophils Absolute: 119 cells/uL (ref 15–500)
Eosinophils Relative: 3.4 %
HCT: 41.4 % (ref 38.5–50.0)
Hemoglobin: 14.5 g/dL (ref 13.2–17.1)
Lymphs Abs: 1348 cells/uL (ref 850–3900)
MCH: 34.8 pg — ABNORMAL HIGH (ref 27.0–33.0)
MCHC: 35 g/dL (ref 32.0–36.0)
MCV: 99.3 fL (ref 80.0–100.0)
MPV: 9 fL (ref 7.5–12.5)
Monocytes Relative: 11.3 %
Neutro Abs: 1638 cells/uL (ref 1500–7800)
Neutrophils Relative %: 46.8 %
Platelets: 248 10*3/uL (ref 140–400)
RBC: 4.17 10*6/uL — ABNORMAL LOW (ref 4.20–5.80)
RDW: 12.4 % (ref 11.0–15.0)
Total Lymphocyte: 38.5 %
WBC: 3.5 10*3/uL — ABNORMAL LOW (ref 3.8–10.8)

## 2019-12-30 LAB — RPR: RPR Ser Ql: NONREACTIVE

## 2019-12-30 LAB — HIV-1 RNA QUANT-NO REFLEX-BLD
HIV 1 RNA Quant: <20 copies/mL — AB
HIV-1 RNA Quant, Log: <1.3 Log copies/mL — AB

## 2020-01-10 ENCOUNTER — Encounter: Payer: Self-pay | Admitting: Family

## 2020-01-11 ENCOUNTER — Ambulatory Visit (INDEPENDENT_AMBULATORY_CARE_PROVIDER_SITE_OTHER): Payer: Self-pay | Admitting: Family

## 2020-01-11 ENCOUNTER — Other Ambulatory Visit: Payer: Self-pay

## 2020-01-11 ENCOUNTER — Encounter: Payer: Self-pay | Admitting: Family

## 2020-01-11 DIAGNOSIS — I10 Essential (primary) hypertension: Secondary | ICD-10-CM

## 2020-01-11 DIAGNOSIS — Z0000 Encounter for general adult medical examination without abnormal findings: Secondary | ICD-10-CM

## 2020-01-11 DIAGNOSIS — B20 Human immunodeficiency virus [HIV] disease: Secondary | ICD-10-CM

## 2020-01-11 MED ORDER — TIVICAY 50 MG PO TABS: ORAL_TABLET | ORAL | 5 refills | Status: DC

## 2020-01-11 MED ORDER — PREZCOBIX 800-150 MG PO TABS: ORAL_TABLET | ORAL | 5 refills | Status: DC

## 2020-01-11 MED ORDER — LISINOPRIL 40 MG PO TABS: ORAL_TABLET | ORAL | 5 refills | Status: DC

## 2020-01-11 MED ORDER — DESCOVY 200-25 MG PO TABS: 1.0000 | ORAL_TABLET | Freq: Every day | ORAL | 5 refills | Status: DC

## 2020-01-11 NOTE — Assessment & Plan Note (Signed)
Mr. Dault has well-controlled HIV disease with good adherence and tolerance to his ART regimen of Descovy, Tivicay, and Prezcobix.  Previous resistance includes M184V, K103N, G190A, and L210W.  He has no signs/symptoms of opportunistic infection or progressive HIV disease.  His CD4 count remains low, however he has been well controlled for greater than 6 months with no indications for Bactrim at the present time.  Continue current dose of Tivicay, Descovy, and Prezcobix.  Financial assistance is up-to-date.  Plan for follow-up in 6 months or sooner if needed with lab work 1 to 2 weeks prior to appointment or on same day.

## 2020-01-11 NOTE — Progress Notes (Signed)
Subjective:    Patient ID: [name], adult    DOB: 03-20-1966, 54 y.o.   MRN: [id number]  Chief Complaint  Patient presents with  . Follow-up    Pt stated went to ER--hypertension     HPI:  [name] is a 54 y.o. adult with HIV disease who was last seen in the office on 06/29/2019 with well-controlled HIV disease with good adherence and tolerance to his ART regimen of Prezcobix, Tivicay, and Descovy.  Viral load at the time found to be undetectable with CD4 count of 196.  Most recent blood work completed on 12/27/2019 with viral load that remains undetectable and CD4 count of 201.  [name] continues to take his Tivicay, Descovy, and Prezcobix as prescribed with no adverse side effects or missed doses since his last office visit.  He unfortunately is been in the emergency room with hypertension recently and questions if he needs continued hypertensive medications at this time.  Overall feeling well today with no new concerns/complaints. Denies fevers, chills, night sweats, headaches, changes in vision, neck pain/stiffness, nausea, diarrhea, vomiting, lesions or rashes.  [name] has no problems obtaining his medication from the pharmacy and remains covered through [employer].  Denies any feelings of being down, depressed, or hopeless recently.  No recreational or illicit drug use, tobacco use, or alcohol consumption presently.  Is not currently sexually active.    Allergies  Allergen Reactions  . Apple Swelling      Outpatient Medications Prior to Visit  Medication Sig Dispense Refill  . valACYclovir (VALTREX) 500 MG tablet TAKE 1 TABLET BY MOUTH TWICE DAILY 60 tablet 5  . darunavir-cobicistat (PREZCOBIX) 800-150 MG tablet TAKE 1 TABLET BY MOUTH DAILY WITH BREAKFAST. SWALLOW WHOLE. DO NOT CRUSH, BREAK OR CHEW. TAKE WITH FOOD 30 tablet 5  . dolutegravir (TIVICAY) 50 MG tablet TAKE 1 TABLET(50 MG) BY MOUTH DAILY 30 tablet 5  . emtricitabine-tenofovir AF (DESCOVY)  200-25 MG tablet Take 1 tablet by mouth daily. 30 tablet 5  . lisinopril (ZESTRIL) 40 MG tablet TAKE 1 TABLET(40 MG) BY MOUTH DAILY 30 tablet 5  . HARVONI 90-400 MG TABS TAKE 1 TABLET BY MOUTH DAILY WITH BREAKFAST (Patient not taking: Reported on 01/11/2020) 28 tablet 0   No facility-administered medications prior to visit.     Past Medical History:  Diagnosis Date  . Abnormal finding on GI tract imaging   . Allergy   . Candidiasis   . Hepatitis C   . Herpes iris   . HIV disease (HCC)   . Hypertension   . Hypokalemia   . Plantar nerve lesion   . Rectal bleeding   . Retinitis   . Syncope   . Ulcer   . Venereal wart      History reviewed. No pertinent surgical history.     Review of Systems  Constitutional: Negative for appetite change, chills, diaphoresis, fatigue, fever and unexpected weight change.  Eyes:       Negative for acute change in vision  Respiratory: Negative for chest tightness, shortness of breath and wheezing.   Cardiovascular: Negative for chest pain.  Gastrointestinal: Negative for diarrhea, nausea and vomiting.  Genitourinary: Negative for dysuria.  Musculoskeletal: Negative for neck pain and neck stiffness.  Skin: Negative for rash.  Neurological: Negative for seizures, syncope, weakness and headaches.  Hematological: Negative for adenopathy. Does not bruise/bleed easily.  Psychiatric/Behavioral: Negative for hallucinations.      Objective:    BP 137/80  Pulse 73   Temp 98.4 F (36.9 C)   Ht 6' (1.829 m)   Wt 231 lb (104.8 kg)   SpO2 97%   BMI 31.33 kg/m  Nursing note and vital signs reviewed.  Physical Exam Constitutional:      General: She is not in acute distress.    Appearance: She is well-developed.  Eyes:     Conjunctiva/sclera: Conjunctivae normal.  Cardiovascular:     Rate and Rhythm: Normal rate and regular rhythm.     Heart sounds: Normal heart sounds. No murmur. No friction rub. No gallop.   Pulmonary:     Effort:  Pulmonary effort is normal. No respiratory distress.     Breath sounds: Normal breath sounds. No wheezing or rales.  Chest:     Chest wall: No tenderness.  Abdominal:     General: Bowel sounds are normal.     Palpations: Abdomen is soft.     Tenderness: There is no abdominal tenderness.  Musculoskeletal:     Cervical back: Neck supple.  Lymphadenopathy:     Cervical: No cervical adenopathy.  Skin:    General: Skin is warm and dry.     Findings: No rash.  Neurological:     Mental Status: She is alert and oriented to person, place, and time.  Psychiatric:        Behavior: Behavior normal.        Thought Content: Thought content normal.        Judgment: Judgment normal.      Depression screen [facility] 2/9 06/29/2019 09/01/2017 01/16/2015 02/16/2014 10/06/2013  Decreased Interest 0 0 0 0 0  Down, Depressed, Hopeless 0 0 0 0 0  PHQ - 2 Score 0 0 0 0 0       Assessment & Plan:    Patient Active Problem List   Diagnosis Date Noted  . High triglycerides 12/14/2018  . Health care maintenance 12/14/2018  . 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 Depression 10/05/2015  . Rash and nonspecific skin eruption 10/06/2013  . Dental abscess 06/02/2012  . 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 RETINITIS 07/11/2010  . ALLERGIES-SEASONAL 05/07/2010  . CANDIDIASIS, ORAL 08/21/2009  . ABNORMAL FINDINGS GI TRACT 01/13/2009  . HYPOKALEMIA 12/25/2008  . RECTAL BLEEDING 12/25/2008  . 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 Essential hypertension 03/23/2008  . SYNCOPE 01/12/2007  . Hepatitis C 09/27/2006  . 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 HX, PERSONAL, PAST NONCOMPLIANCE 09/27/2006  . Human immunodeficiency virus (HIV) disease (HCC) 02/06/2006     Problem List Items Addressed This Visit      Cardiovascular and Mediastinum   Essential hypertension    Blood pressure adequately controlled today below goal of 140/90.  Discussed medication holiday of 1 week to determine need for  continued medication although given most recent emergency department visit will likely require continued medication.  Encouraged to monitor blood pressure at home and follow low-sodium diet.  Encouraged to decrease carbohydrate/processed sugary food intake.  Continue current dose of lisinopril with possible holiday for 1 week.      Relevant Medications   lisinopril (ZESTRIL) 40 MG tablet     Other   Human immunodeficiency virus (HIV) disease (HCC)    [name] has well-controlled HIV disease with good adherence and tolerance to his ART regimen of Descovy, Tivicay, and Prezcobix.  Previous resistance includes M184V, K103N, G190A, and L210W.  He has no signs/symptoms of opportunistic infection or progressive HIV disease.  His  CD4 count remains low, however he has been well controlled for greater than 6 months with no indications for Bactrim at the present time.  Continue current dose of Tivicay, Descovy, and Prezcobix.  Financial assistance is up-to-date.  Plan for follow-up in 6 months or sooner if needed with lab work 1 to 2 weeks prior to appointment or on same day.      Relevant Medications   emtricitabine-tenofovir AF (DESCOVY) 200-25 MG tablet   dolutegravir (TIVICAY) 50 MG tablet   darunavir-cobicistat (PREZCOBIX) 800-150 MG tablet   lisinopril (ZESTRIL) 40 MG tablet   Health care maintenance     Immunizations up-to-date per recommendations.  Discussed importance of safe sexual practice to reduce risk of STI.  Condoms declined.          I have discontinued Jazmine T. Espino's Harvoni. I am also having her maintain her valACYclovir, Descovy, Tivicay, Prezcobix, and lisinopril.   Meds ordered this encounter  Medications  . emtricitabine-tenofovir AF (DESCOVY) 200-25 MG tablet    Sig: Take 1 tablet by mouth daily.    Dispense:  30 tablet    Refill:  5    Order Specific Question:   Supervising Provider    Answer:   Carlyle Basques [[id number]]  . dolutegravir (TIVICAY) 50 MG tablet     Sig: TAKE 1 TABLET(50 MG) BY MOUTH DAILY    Dispense:  30 tablet    Refill:  5    Order Specific Question:   Supervising Provider    Answer:   Carlyle Basques [[id number]]  . darunavir-cobicistat (PREZCOBIX) 800-150 MG tablet    Sig: TAKE 1 TABLET BY MOUTH DAILY WITH BREAKFAST. SWALLOW WHOLE. DO NOT CRUSH, BREAK OR CHEW. TAKE WITH FOOD    Dispense:  30 tablet    Refill:  5    Order Specific Question:   Supervising Provider    Answer:   Carlyle Basques [[id number]]  . lisinopril (ZESTRIL) 40 MG tablet    Sig: TAKE 1 TABLET(40 MG) BY MOUTH DAILY    Dispense:  30 tablet    Refill:  5    Order Specific Question:   Supervising Provider    Answer:   Carlyle Basques [[id number]]     Follow-up: Return in about 6 months (around 07/10/2020), or if symptoms worsen or fail to improve.   Terri Piedra, MSN, FNP-C Nurse Practitioner [facility] [place] number: [phone]

## 2020-01-11 NOTE — Assessment & Plan Note (Signed)
   Immunizations up-to-date per recommendations.  Discussed importance of safe sexual practice to reduce risk of STI.  Condoms declined.

## 2020-01-11 NOTE — Assessment & Plan Note (Signed)
Blood pressure adequately controlled today below goal of 140/90.  Discussed medication holiday of 1 week to determine need for continued medication although given most recent emergency department visit will likely require continued medication.  Encouraged to monitor blood pressure at home and follow low-sodium diet.  Encouraged to decrease carbohydrate/processed sugary food intake.  Continue current dose of lisinopril with possible holiday for 1 week.

## 2020-01-11 NOTE — Patient Instructions (Addendum)
Nice to see you.   Continue to take your medicaiton as prescribed.  Monitor your blood pressure at home as able.  Refills have been sent to pharmacy.  Plan for follow up in 6 months or sooner if needed with lab work 1-2 weeks prior to your appointment.   Have a great day and stay safe!

## 2020-01-29 IMAGING — DX CHEST - 2 VIEW
2 series · 2 of 2 positions shown · non-contrast
Comparison: 02/10/2016

CLINICAL DATA: Chest discomfort

EXAM:
CHEST - 2 VIEW

[w chest pa]
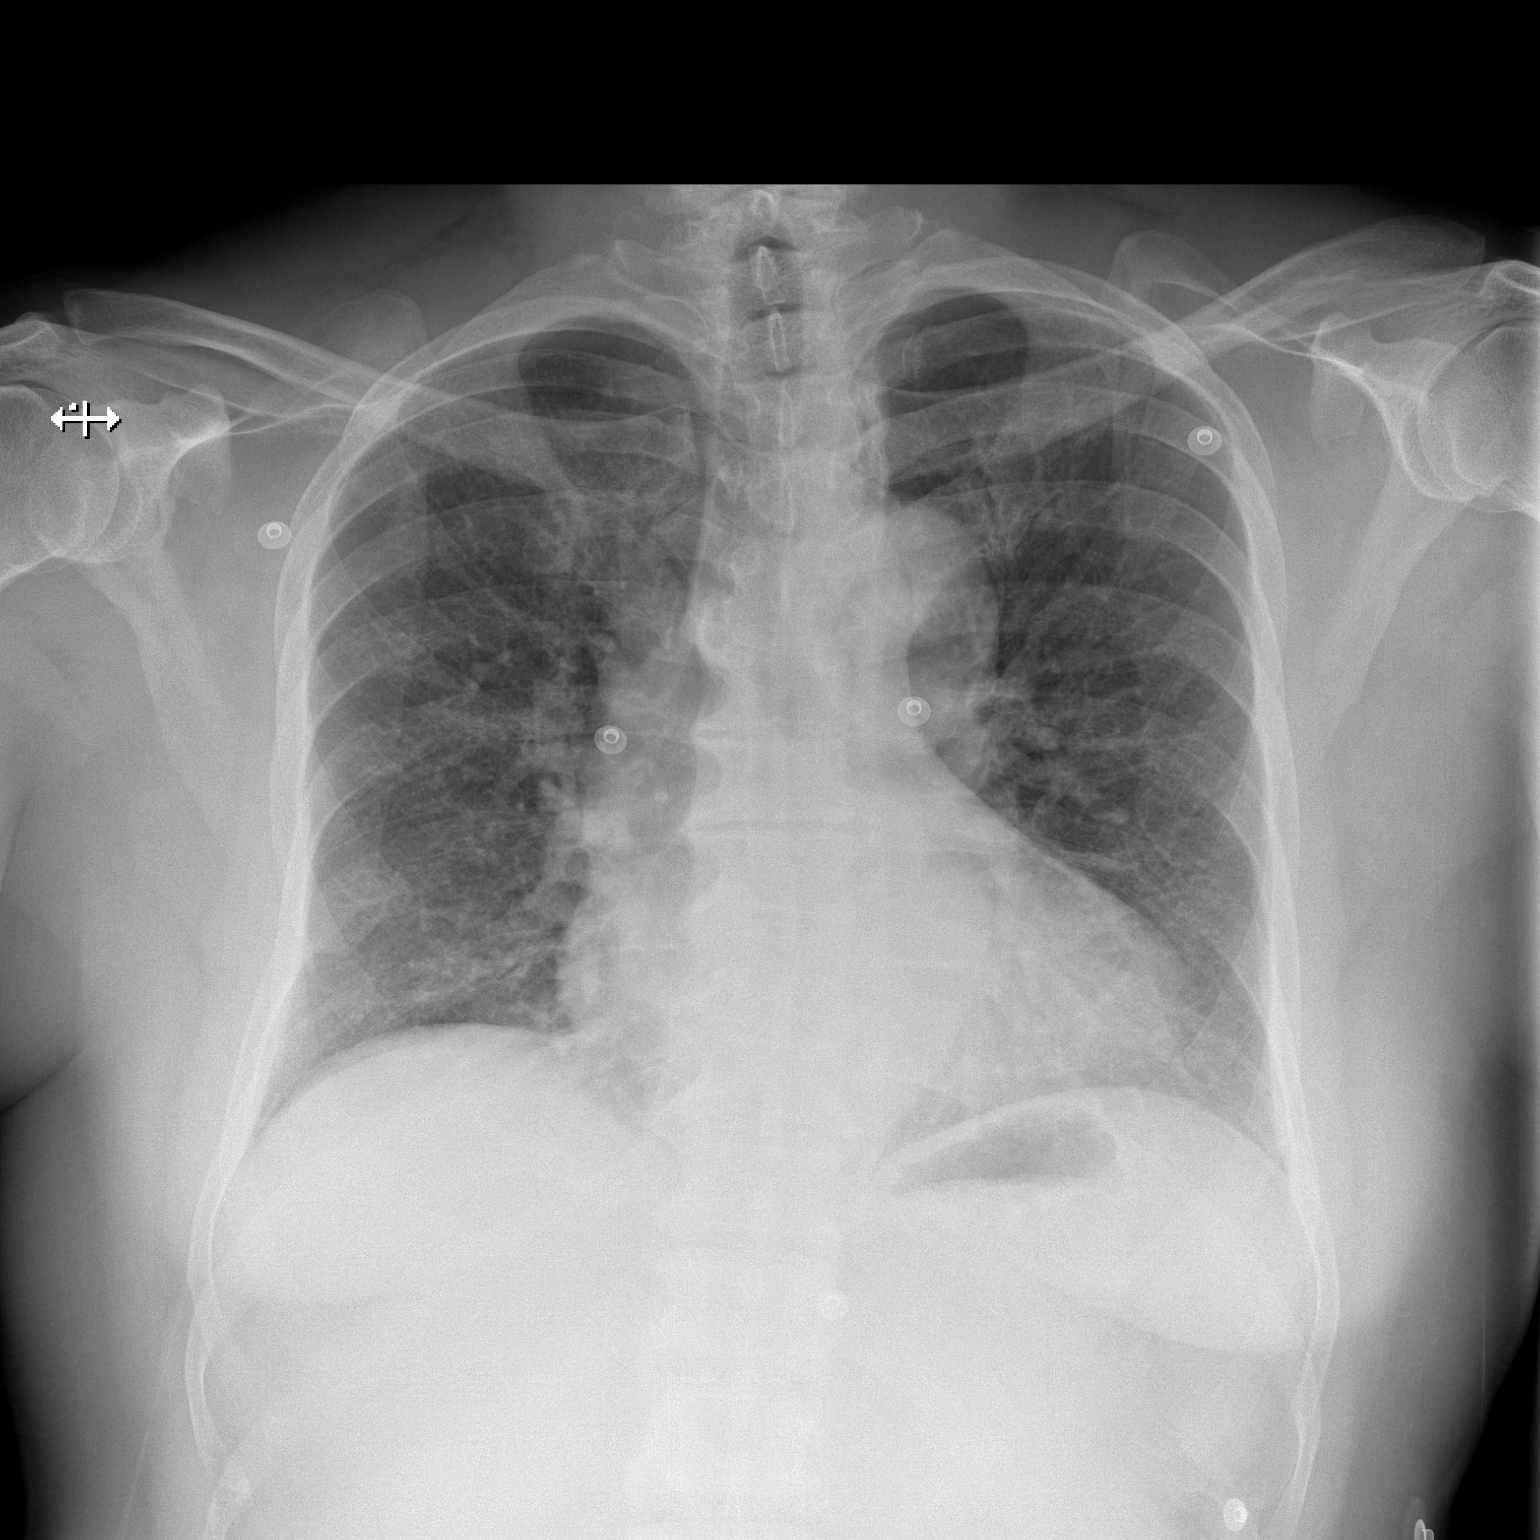

[w chest lat]
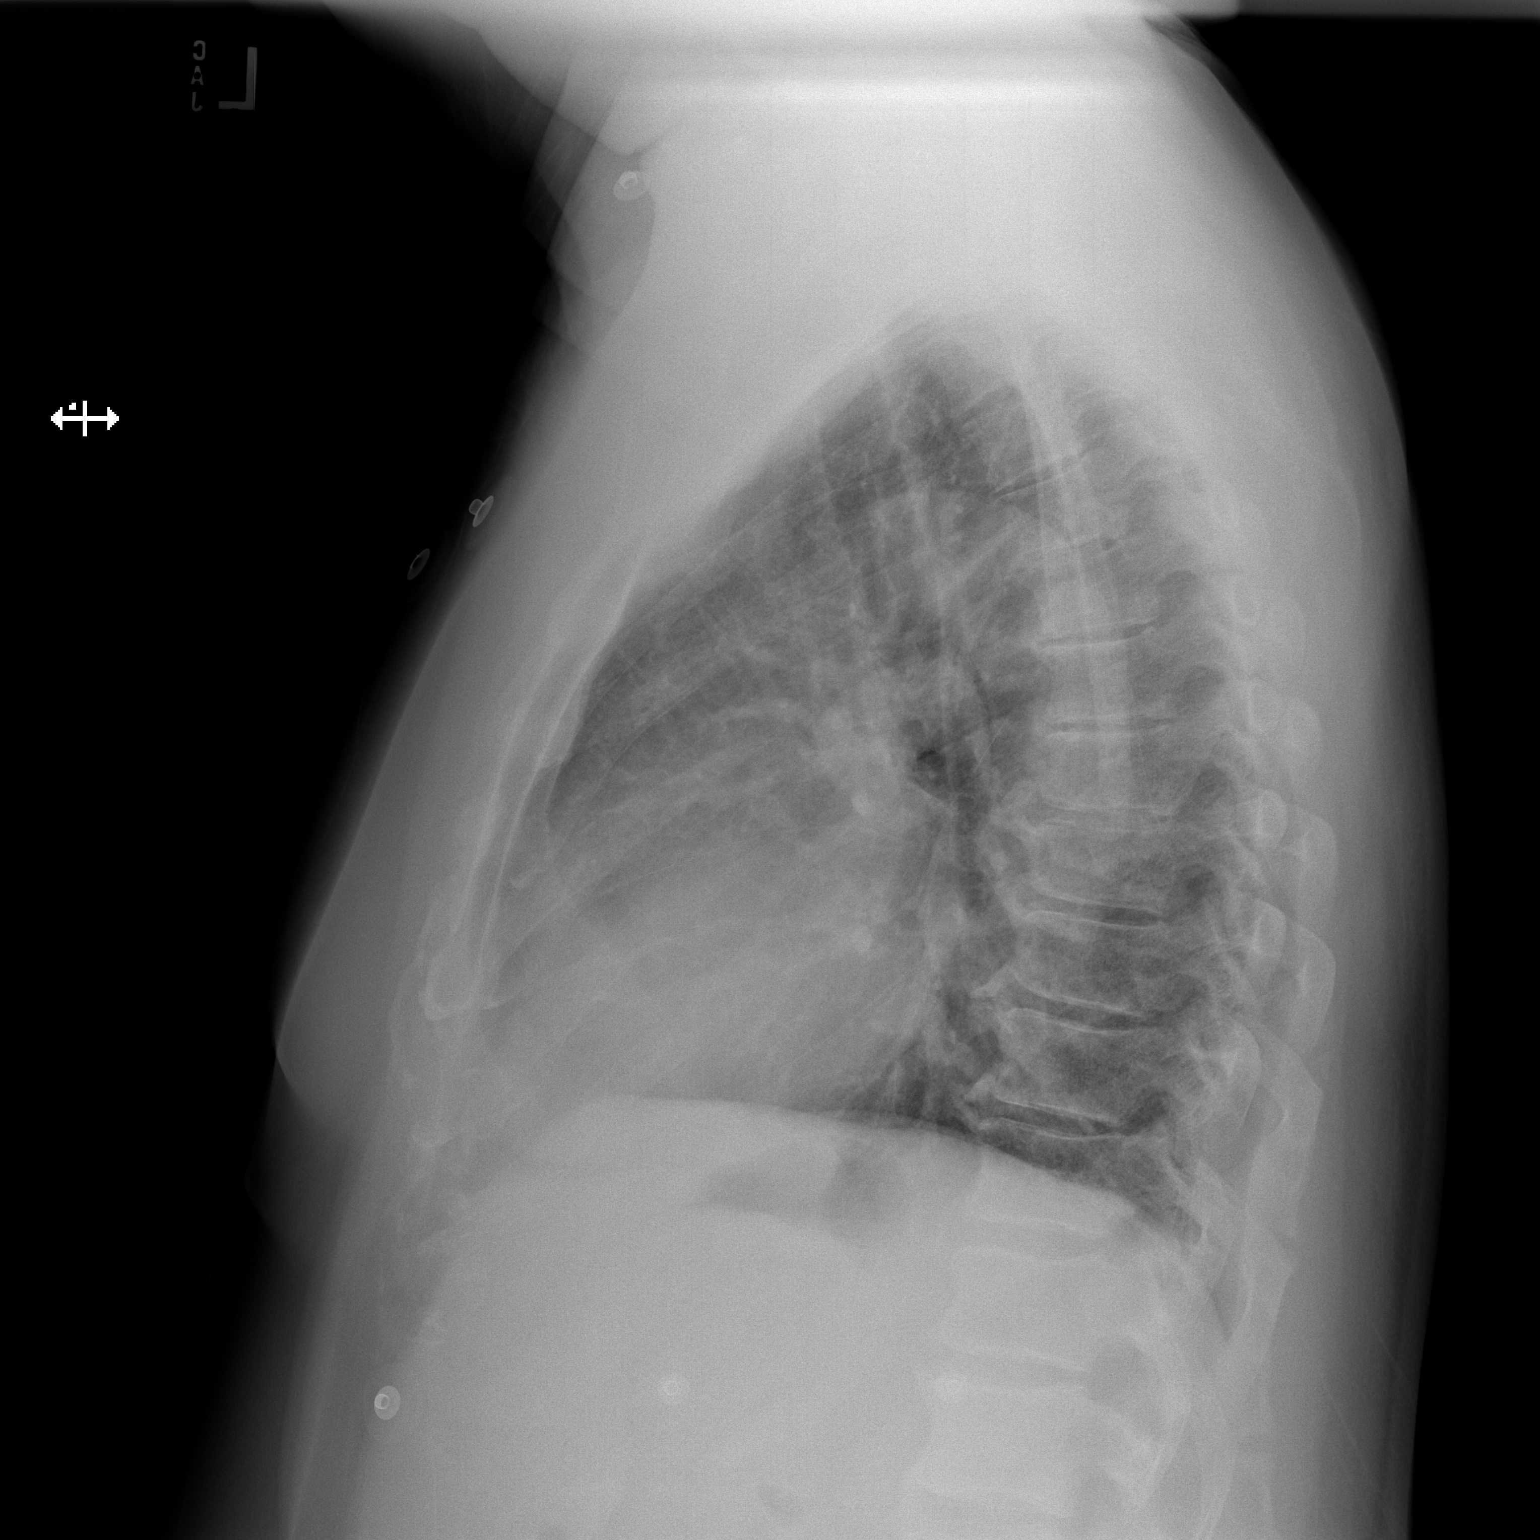

[2 of 2 positions shown; findings below may reference images not displayed]

FINDINGS: Low lung volumes. Mild vague opacities in the right upper lobe and
bilateral lower lobes, raising concern for mild infection. No
pleural effusion or pneumothorax.

Heart is top-normal in size.

Degenerative changes of the visualized thoracolumbar spine.
IMPRESSION: Vague bilateral pulmonary opacities, raising concern for mild
infection, although this appearance may be exacerbated by poor
inspiration.

## 2020-04-14 ENCOUNTER — Emergency Department (HOSPITAL_COMMUNITY)
Admission: EM | Admit: 2020-04-14 | Discharge: 2020-04-15 | Disposition: A | Discharge status: Home / Self Care | Payer: Self-pay | Attending: Emergency Medicine | Admitting: Emergency Medicine

## 2020-04-14 ENCOUNTER — Other Ambulatory Visit: Payer: Self-pay

## 2020-04-14 ENCOUNTER — Encounter (HOSPITAL_COMMUNITY): Payer: Self-pay

## 2020-04-14 DIAGNOSIS — I10 Essential (primary) hypertension: Secondary | ICD-10-CM | POA: Insufficient documentation

## 2020-04-14 DIAGNOSIS — Z79899 Other long term (current) drug therapy: Secondary | ICD-10-CM | POA: Insufficient documentation

## 2020-04-14 DIAGNOSIS — Z21 Asymptomatic human immunodeficiency virus [HIV] infection status: Secondary | ICD-10-CM | POA: Insufficient documentation

## 2020-04-14 DIAGNOSIS — L03011 Cellulitis of right finger: Secondary | ICD-10-CM

## 2020-04-14 MED ORDER — LIDOCAINE HCL (PF) 1 % IJ SOLN
5.0000 mL | Freq: Once | INTRAMUSCULAR | Status: AC
  Administered 2020-04-14: 5 mL
  Filled 2020-04-14: qty 5

## 2020-04-14 NOTE — ED Provider Notes (Addendum)
[facility] EMERGENCY DEPARTMENT Provider Note   CSN: [id number] Arrival date & time: 04/14/20  2144     History Chief Complaint  Patient presents with  . Finger Injury    [name] is a 54 y.o. adult.  Patient to ED with several days of painful swelling around his right middle finger nail. No drainage. No fever. No known injury. 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   The history is provided by the patient. No language interpreter was used.       Past Medical History:  Diagnosis Date  . Abnormal finding on GI tract imaging   . Allergy   . Candidiasis   . Hepatitis C   . Herpes iris   . HIV disease ([place])   . Hypertension   . Hypokalemia   . Plantar nerve lesion   . Rectal bleeding   . Retinitis   . Syncope   . Ulcer   . Venereal wart     Patient Active Problem List   Diagnosis Date Noted  . High triglycerides 12/14/2018  . Health care maintenance 12/14/2018  . Complicated grief 47/82/9562  . Depression 10/05/2015  . Rash and nonspecific skin eruption 10/06/2013  . Dental abscess 06/02/2012  . 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 RETINITIS 07/11/2010  . ALLERGIES-SEASONAL 05/07/2010  . CANDIDIASIS, ORAL 08/21/2009  . ABNORMAL FINDINGS GI TRACT 01/13/2009  . HYPOKALEMIA 12/25/2008  . RECTAL BLEEDING 12/25/2008  . 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 Essential hypertension 03/23/2008  . SYNCOPE 01/12/2007  . Hepatitis C 09/27/2006  . 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 HX, PERSONAL, PAST NONCOMPLIANCE 09/27/2006  . Human immunodeficiency virus (HIV) disease ([place]) 02/06/2006    History reviewed. No pertinent surgical history.     Family History  Problem Relation Age of Onset  . Hypertension Sister   . Hypertension Brother   . Cancer Mother        pancreatic cancer    Social History   Tobacco Use  . Smoking status: Never Smoker  . Smokeless tobacco: Never Used    Substance Use Topics  . Alcohol use: No  . Drug use: No    Home Medications Prior to Admission medications   Medication Sig Start Date End Date Taking? Authorizing Provider  darunavir-cobicistat (PREZCOBIX) 800-150 MG tablet TAKE 1 TABLET BY MOUTH DAILY WITH BREAKFAST. SWALLOW WHOLE. DO NOT CRUSH, BREAK OR CHEW. TAKE WITH FOOD 01/11/20   Golden Circle, FNP  dolutegravir (TIVICAY) 50 MG tablet TAKE 1 TABLET(50 MG) BY MOUTH DAILY 01/11/20   Golden Circle, FNP  emtricitabine-tenofovir AF (DESCOVY) 200-25 MG tablet Take 1 tablet by mouth daily. 01/11/20   Golden Circle, FNP  lisinopril (ZESTRIL) 40 MG tablet TAKE 1 TABLET(40 MG) BY MOUTH DAILY 01/11/20   Golden Circle, FNP  valACYclovir (VALTREX) 500 MG tablet TAKE 1 TABLET BY MOUTH TWICE DAILY 12/27/19   Golden Circle, FNP  potassium chloride (K-DUR) 10 MEQ tablet Take 1 tablet (10 mEq total) by mouth once. Repeat labs at [facility] the next day Patient not taking: Reported on 01/31/2016 10/10/15 02/10/16  Campbell Riches, MD    Allergies    Apple  Review of Systems   Review of Systems  Constitutional: Negative for fever.  Musculoskeletal:       See HPI.  Skin: Positive for color change.       Swelling and  discoloration around finger nail  Neurological: Negative for numbness.    Physical Exam Updated Vital Signs BP (!) 133/93 (BP Location: Left Arm)   Pulse 70   Temp 98.4 F (36.9 C) (Oral)   Resp 16   Ht 6' (1.829 m)   Wt 104.8 kg   SpO2 98%   BMI 31.33 kg/m   Physical Exam Vitals and nursing note reviewed.  Constitutional:      Appearance: She is well-developed.  Pulmonary:     Effort: Pulmonary effort is normal.  Musculoskeletal:     Cervical back: Normal range of motion.     Comments: Right middle finger has moderate swelling around nail without drainage. No subungual discoloration or hemorrhage. Tender. No tenderness or significant swelling to the palmar pad of finger to suggest felon.  Skin:     General: Skin is warm and dry.  Neurological:     Mental Status: She is alert and oriented to person, place, and time.     ED Results / Procedures / Treatments   Labs (all labs ordered are listed, but only abnormal results are displayed) Labs Reviewed - No data to display  EKG None  Radiology No results found.  Procedures Drain paronychia  Date/Time: 04/15/2020 12:26 AM Performed by: Elpidio Anis, PA-C Authorized by: Elpidio Anis, PA-C  Consent: Verbal consent obtained. Consent given by: patient Patient understanding: patient states understanding of the procedure being performed Patient identity confirmed: verbally with patient Time out: Immediately prior to procedure a "time out" was called to verify the correct patient, procedure, equipment, support staff and site/side marked as required. Preparation: Patient was prepped and draped in the usual sterile fashion. Local anesthesia used: yes Anesthesia: digital block  Anesthesia: Local anesthesia used: yes Local Anesthetic: lidocaine 1% without epinephrine Anesthetic total: 4 mL  Sedation: Patient sedated: no  Patient tolerance: patient tolerated the procedure well with no immediate complications Comments: #11 blade used to open cuticle border with adequate purulent/bloody drainage. Culture collected.    (including critical care time)  Medications Ordered in ED Medications  lidocaine (PF) (XYLOCAINE) 1 % injection 5 mL (has no administration in time range)    ED Course  I have reviewed the triage vital signs and the nursing notes.  Pertinent labs & imaging results that were available during my care of the patient were reviewed by me and considered in my medical decision making (see chart for details).    MDM Rules/Calculators/A&P                      Patient to ED with paronychia of right middle finger x 2-3 days. No drainage.   There is no evidence of development of felon on exam.  Finger infection  opened and drained as per procedure note. Will start on antibiotic. Culture pending.   Care instructions provided. Encouraged recheck with PCP Monday or Tuesday (2-3 days).   Final Clinical Impression(s) / ED Diagnoses Final diagnoses:  None   1. Paronychia, right 3rd finger  Rx / DC Orders ED Discharge Orders    None       Elpidio Anis, PA-C 04/15/20 0025    Elpidio Anis, PA-C 04/15/20 [id number]    Rolan Bucco, MD 04/15/20 1513

## 2020-04-14 NOTE — ED Triage Notes (Signed)
Pt states for the past week he has had pain and swelling to the R middle finger around to tip and nailbed. No fevers.

## 2020-04-15 MED ORDER — CEPHALEXIN 500 MG PO CAPS
500.0000 mg | ORAL_CAPSULE | Freq: Four times a day (QID) | ORAL | 0 refills | Status: DC
Start: 2020-04-15 — End: 2021-01-17

## 2020-04-15 MED ORDER — CEPHALEXIN 250 MG PO CAPS
500.0000 mg | ORAL_CAPSULE | Freq: Once | ORAL | Status: AC
  Administered 2020-04-15: 500 mg via ORAL
  Filled 2020-04-15: qty 2

## 2020-04-15 MED ORDER — IBUPROFEN 600 MG PO TABS: 600.0000 mg | ORAL_TABLET | Freq: Four times a day (QID) | ORAL | 0 refills | Status: AC | PRN

## 2020-04-15 MED ORDER — IBUPROFEN 400 MG PO TABS
600.0000 mg | ORAL_TABLET | Freq: Once | ORAL | Status: AC
  Administered 2020-04-15: 600 mg via ORAL
  Filled 2020-04-15: qty 1

## 2020-04-15 NOTE — Discharge Instructions (Signed)
Follow up with Dr. Ninetta Lights, or your current doctor, for recheck of infection in 2-3 days. If you develop a fever, have increasing pain or swelling of the finger, return to the emergency department for further management.

## 2020-04-17 LAB — AEROBIC CULTURE W GRAM STAIN (SUPERFICIAL SPECIMEN): Gram Stain: NONE SEEN

## 2020-06-19 ENCOUNTER — Other Ambulatory Visit: Payer: Self-pay

## 2020-06-20 ENCOUNTER — Other Ambulatory Visit: Payer: Self-pay

## 2020-06-20 ENCOUNTER — Other Ambulatory Visit: Payer: Self-pay | Admitting: *Deleted

## 2020-06-20 DIAGNOSIS — B20 Human immunodeficiency virus [HIV] disease: Secondary | ICD-10-CM

## 2020-06-20 DIAGNOSIS — Z113 Encounter for screening for infections with a predominantly sexual mode of transmission: Secondary | ICD-10-CM

## 2020-06-21 LAB — T-HELPER CELL (CD4) - (RCID CLINIC ONLY)
CD4 % Helper T Cell: 15 % — ABNORMAL LOW (ref 33–65)
CD4 T Cell Abs: 283 /uL — ABNORMAL LOW (ref 400–1790)

## 2020-06-23 LAB — COMPLETE METABOLIC PANEL WITH GFR
AG Ratio: 1.6 (calc) (ref 1.0–2.5)
ALT: 11 U/L (ref 9–46)
AST: 14 U/L (ref 10–35)
Albumin: 4.4 g/dL (ref 3.6–5.1)
Alkaline phosphatase (APISO): 72 U/L (ref 35–144)
BUN: 11 mg/dL (ref 7–25)
CO2: 23 mmol/L (ref 20–32)
Calcium: 9.6 mg/dL (ref 8.6–10.3)
Chloride: 107 mmol/L (ref 98–110)
Creat: 0.86 mg/dL (ref 0.70–1.33)
GFR, Est African American: 114 mL/min/{1.73_m2} (ref >=60)
GFR, Est Non African American: 98 mL/min/{1.73_m2} (ref >=60)
Globulin: 2.8 g/dL (calc) (ref 1.9–3.7)
Glucose, Bld: 112 mg/dL — ABNORMAL HIGH (ref 65–99)
Potassium: 3.8 mmol/L (ref 3.5–5.3)
Sodium: 139 mmol/L (ref 135–146)
Total Bilirubin: 0.4 mg/dL (ref 0.2–1.2)
Total Protein: 7.2 g/dL (ref 6.1–8.1)

## 2020-06-23 LAB — CBC WITH DIFFERENTIAL/PLATELET
Absolute Monocytes: 456 cells/uL (ref 200–950)
Basophils Absolute: 10 cells/uL (ref 0–200)
Basophils Relative: 0.2 %
Eosinophils Absolute: 221 cells/uL (ref 15–500)
Eosinophils Relative: 4.5 %
HCT: 39.8 % (ref 38.5–50.0)
Hemoglobin: 14 g/dL (ref 13.2–17.1)
Lymphs Abs: 1847 cells/uL (ref 850–3900)
MCH: 34.7 pg — ABNORMAL HIGH (ref 27.0–33.0)
MCHC: 35.2 g/dL (ref 32.0–36.0)
MCV: 98.5 fL (ref 80.0–100.0)
MPV: 9.2 fL (ref 7.5–12.5)
Monocytes Relative: 9.3 %
Neutro Abs: 2367 cells/uL (ref 1500–7800)
Neutrophils Relative %: 48.3 %
Platelets: 232 10*3/uL (ref 140–400)
RBC: 4.04 10*6/uL — ABNORMAL LOW (ref 4.20–5.80)
RDW: 12.8 % (ref 11.0–15.0)
Total Lymphocyte: 37.7 %
WBC: 4.9 10*3/uL (ref 3.8–10.8)

## 2020-06-23 LAB — HIV-1 RNA QUANT-NO REFLEX-BLD
HIV 1 RNA Quant: <20 Copies/mL
HIV-1 RNA Quant, Log: <1.3 Log cps/mL

## 2020-07-03 ENCOUNTER — Other Ambulatory Visit: Payer: Self-pay

## 2020-07-03 ENCOUNTER — Ambulatory Visit: Payer: Self-pay

## 2020-07-03 ENCOUNTER — Encounter: Payer: Self-pay | Admitting: Family

## 2020-07-03 ENCOUNTER — Ambulatory Visit (INDEPENDENT_AMBULATORY_CARE_PROVIDER_SITE_OTHER): Payer: Self-pay | Admitting: Family

## 2020-07-03 DIAGNOSIS — B20 Human immunodeficiency virus [HIV] disease: Secondary | ICD-10-CM

## 2020-07-03 DIAGNOSIS — Z0000 Encounter for general adult medical examination without abnormal findings: Secondary | ICD-10-CM

## 2020-07-03 MED ORDER — DESCOVY 200-25 MG PO TABS: 1.0000 | ORAL_TABLET | Freq: Every day | ORAL | 5 refills | Status: DC

## 2020-07-03 MED ORDER — PREZCOBIX 800-150 MG PO TABS: ORAL_TABLET | ORAL | 5 refills | Status: DC

## 2020-07-03 MED ORDER — TIVICAY 50 MG PO TABS: ORAL_TABLET | ORAL | 5 refills | Status: DC

## 2020-07-03 NOTE — Assessment & Plan Note (Signed)
They continue to have well-controlled HIV disease with good adherence and tolerance to their ART regimen of Prezcobix, Tivicay, and Descovy.  We reviewed lab work and discussed the plan of care.  Financial assistance renewed today.  Continue current dose of Prezcobix, Tivicay, and Descovy.  Plan for follow-up in 6 months or sooner if needed with lab work 1 to 2 weeks prior to appointment.

## 2020-07-03 NOTE — Addendum Note (Signed)
Addended by: Jeanine Luz D on: 07/03/2020 03:58 PM   Modules accepted: Orders

## 2020-07-03 NOTE — Patient Instructions (Signed)
Nice to see you.  We will continue with medication.  Refills have been sent to the pharmacy.  COVID vaccine recommended.  Plan for follow up in 6 months or sooner if needed with lab work 1-2 weeks prior to your appointment.   Have a great day and safe!

## 2020-07-03 NOTE — Assessment & Plan Note (Signed)
·   Discussed/recommended Covid vaccine.  Due for tetanus vaccination at next office visit.  Referral to [facility] dental clinic placed.   Discussed importance of safe sexual practice to reduce risk of STI.  Condoms provided.

## 2020-07-03 NOTE — Progress Notes (Addendum)
Subjective:    Patient ID: [name], adult    DOB: 09/24/1966, 54 y.o.   MRN: [id number]  Chief Complaint  Patient presents with  . Follow-up    offered condoms; no complaints     HPI:  [name] is a 54 y.o. adult with HIV disease last seen in the office on 01/11/2020 with well-controlled HIV disease with good adherence and tolerance to ART regimen of Descovy, Tivicay, and Prezcobix.  Viral load at the time was found to be undetectable with CD4 count of 201.  Most recent blood work completed on 06/20/2020 with a viral load that remains undetectable and CD4 count of 283.  Here today for routine follow-up.  They have good adherence and tolerance to their ART regimen of Descovy, Tivicay, and Prezcobix with no adverse side effects or missed doses since their last office visit. Denies fevers, chills, night sweats, headaches, changes in vision, neck pain/stiffness, nausea, diarrhea, vomiting, lesions or rashes.  They have no problems obtaining her medication from the pharmacy with financial assistance renewed today.  Denies feelings of being down, depressed, or hopeless recently.  No recreational or illicit drug use, tobacco use, or alcohol consumption.  Condoms provided.  Due for routine dental care and may be interested in receiving Covid vaccine.   Allergies  Allergen Reactions  . Apple Swelling      Outpatient Medications Prior to Visit  Medication Sig Dispense Refill  . ibuprofen (ADVIL) 600 MG tablet Take 1 tablet (600 mg total) by mouth every 6 (six) hours as needed. 30 tablet 0  . lisinopril (ZESTRIL) 40 MG tablet TAKE 1 TABLET(40 MG) BY MOUTH DAILY 30 tablet 5  . valACYclovir (VALTREX) 500 MG tablet TAKE 1 TABLET BY MOUTH TWICE DAILY 60 tablet 5  . darunavir-cobicistat (PREZCOBIX) 800-150 MG tablet TAKE 1 TABLET BY MOUTH DAILY WITH BREAKFAST. SWALLOW WHOLE. DO NOT CRUSH, BREAK OR CHEW. TAKE WITH FOOD 30 tablet 5  . dolutegravir (TIVICAY) 50 MG tablet TAKE 1  TABLET(50 MG) BY MOUTH DAILY 30 tablet 5  . emtricitabine-tenofovir AF (DESCOVY) 200-25 MG tablet Take 1 tablet by mouth daily. 30 tablet 5  . cephALEXin (KEFLEX) 500 MG capsule Take 1 capsule (500 mg total) by mouth 4 (four) times daily. 28 capsule 0   No facility-administered medications prior to visit.     Past Medical History:  Diagnosis Date  . Abnormal finding on GI tract imaging   . Allergy   . Candidiasis   . Hepatitis C   . Herpes iris   . HIV disease (HCC)   . Hypertension   . Hypokalemia   . Plantar nerve lesion   . Rectal bleeding   . Retinitis   . Syncope   . Ulcer   . Venereal wart      History reviewed. No pertinent surgical history.     Review of Systems  Constitutional: Negative for appetite change, chills, diaphoresis, fatigue, fever and unexpected weight change.  Eyes:       Negative for acute change in vision  Respiratory: Negative for chest tightness, shortness of breath and wheezing.   Cardiovascular: Negative for chest pain.  Gastrointestinal: Negative for diarrhea, nausea and vomiting.  Genitourinary: Negative for dysuria.  Musculoskeletal: Negative for neck pain and neck stiffness.  Skin: Negative for rash.  Neurological: Negative for seizures, syncope, weakness and headaches.  Hematological: Negative for adenopathy. Does not bruise/bleed easily.  Psychiatric/Behavioral: Negative for hallucinations.      Objective:  BP (!) 143/83   Pulse 88   Temp 99 F (37.2 C) (Oral)  Nursing note and vital signs reviewed.  Physical Exam Constitutional:      General: She is not in acute distress.    Appearance: She is well-developed.  Eyes:     Conjunctiva/sclera: Conjunctivae normal.  Cardiovascular:     Rate and Rhythm: Normal rate and regular rhythm.     Heart sounds: Normal heart sounds. No murmur heard.  No friction rub. No gallop.   Pulmonary:     Effort: Pulmonary effort is normal. No respiratory distress.     Breath sounds: Normal  breath sounds. No wheezing or rales.  Chest:     Chest wall: No tenderness.  Abdominal:     General: Bowel sounds are normal.     Palpations: Abdomen is soft.     Tenderness: There is no abdominal tenderness.  Musculoskeletal:     Cervical back: Neck supple.  Lymphadenopathy:     Cervical: No cervical adenopathy.  Skin:    General: Skin is warm and dry.     Findings: No rash.  Neurological:     Mental Status: She is alert and oriented to person, place, and time.  Psychiatric:        Behavior: Behavior normal.        Thought Content: Thought content normal.        Judgment: Judgment normal.      Depression screen [facility] : [facility] 2/9 07/03/2020 06/29/2019 09/01/2017 01/16/2015 02/16/2014  Decreased Interest 0 0 0 0 0  Down, Depressed, Hopeless 0 0 0 0 0  PHQ - 2 Score 0 0 0 0 0       Assessment & Plan:    Patient Active Problem List   Diagnosis Date Noted  . High triglycerides 12/14/2018  . Health care maintenance 12/14/2018  . 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 Depression 10/05/2015  . Rash and nonspecific skin eruption 10/06/2013  . Dental abscess 06/02/2012  . 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 RETINITIS 07/11/2010  . ALLERGIES-SEASONAL 05/07/2010  . CANDIDIASIS, ORAL 08/21/2009  . ABNORMAL FINDINGS GI TRACT 01/13/2009  . HYPOKALEMIA 12/25/2008  . RECTAL BLEEDING 12/25/2008  . 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 Essential hypertension 03/23/2008  . SYNCOPE 01/12/2007  . Hepatitis C 09/27/2006  . 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 HX, PERSONAL, PAST NONCOMPLIANCE 09/27/2006  . Human immunodeficiency virus (HIV) disease (HCC) 02/06/2006     Problem List Items Addressed This Visit      Other   Human immunodeficiency virus (HIV) disease (HCC)    They continue to have well-controlled HIV disease with good adherence and tolerance to their ART regimen of Prezcobix, Tivicay, and Descovy.  We reviewed lab work and discussed the plan of care.  Financial  assistance renewed today.  Continue current dose of Prezcobix, Tivicay, and Descovy.  Plan for follow-up in 6 months or sooner if needed with lab work 1 to 2 weeks prior to appointment.      Relevant Medications   darunavir-cobicistat (PREZCOBIX) 800-150 MG tablet   dolutegravir (TIVICAY) 50 MG tablet   emtricitabine-tenofovir AF (DESCOVY) 200-25 MG tablet   Other Relevant Orders   HIV-1 RNA quant-no reflex-bld   CBC   Comprehensive metabolic panel   T-helper cell (CD4)- ([facility] clinic only)   Health care maintenance     Discussed/recommended Covid vaccine.  Due for tetanus vaccination at next office visit.  Referral to [facility] dental clinic placed.  Discussed importance of safe sexual practice to reduce risk of STI.  Condoms provided.          I am having Kamron T. Delford Field maintain her valACYclovir, lisinopril, cephALEXin, ibuprofen, Prezcobix, Tivicay, and Descovy.   Meds ordered this encounter  Medications  . darunavir-cobicistat (PREZCOBIX) 800-150 MG tablet    Sig: TAKE 1 TABLET BY MOUTH DAILY WITH BREAKFAST. SWALLOW WHOLE. DO NOT CRUSH, BREAK OR CHEW. TAKE WITH FOOD    Dispense:  30 tablet    Refill:  5    Order Specific Question:   Supervising Provider    Answer:   Judyann Munson [[id number]]  . dolutegravir (TIVICAY) 50 MG tablet    Sig: TAKE 1 TABLET(50 MG) BY MOUTH DAILY    Dispense:  30 tablet    Refill:  5    Order Specific Question:   Supervising Provider    Answer:   Judyann Munson [[id number]]  . emtricitabine-tenofovir AF (DESCOVY) 200-25 MG tablet    Sig: Take 1 tablet by mouth daily.    Dispense:  30 tablet    Refill:  5    Order Specific Question:   Supervising Provider    Answer:   Judyann Munson [[id number]]     Follow-up: Return in about 6 months (around 01/03/2021).   Marcos Eke, MSN, FNP-C Nurse Practitioner [facility] for Infectious Disease [facility] Main number: [phone]

## 2020-07-16 ENCOUNTER — Other Ambulatory Visit: Payer: Self-pay | Admitting: Family

## 2020-07-16 DIAGNOSIS — B009 Herpesviral infection, unspecified: Secondary | ICD-10-CM

## 2020-07-16 DIAGNOSIS — B20 Human immunodeficiency virus [HIV] disease: Secondary | ICD-10-CM

## 2020-07-16 DIAGNOSIS — I10 Essential (primary) hypertension: Secondary | ICD-10-CM

## 2020-07-17 ENCOUNTER — Other Ambulatory Visit: Payer: Self-pay | Admitting: Family

## 2020-07-17 DIAGNOSIS — B20 Human immunodeficiency virus [HIV] disease: Secondary | ICD-10-CM

## 2020-07-27 IMAGING — DX DG CHEST 1V PORT
1 series · 1 of 1 positions shown · non-contrast
Comparison: 06/13/2019

CLINICAL DATA: Hypertension

EXAM:
PORTABLE CHEST 1 VIEW

[chest ap]
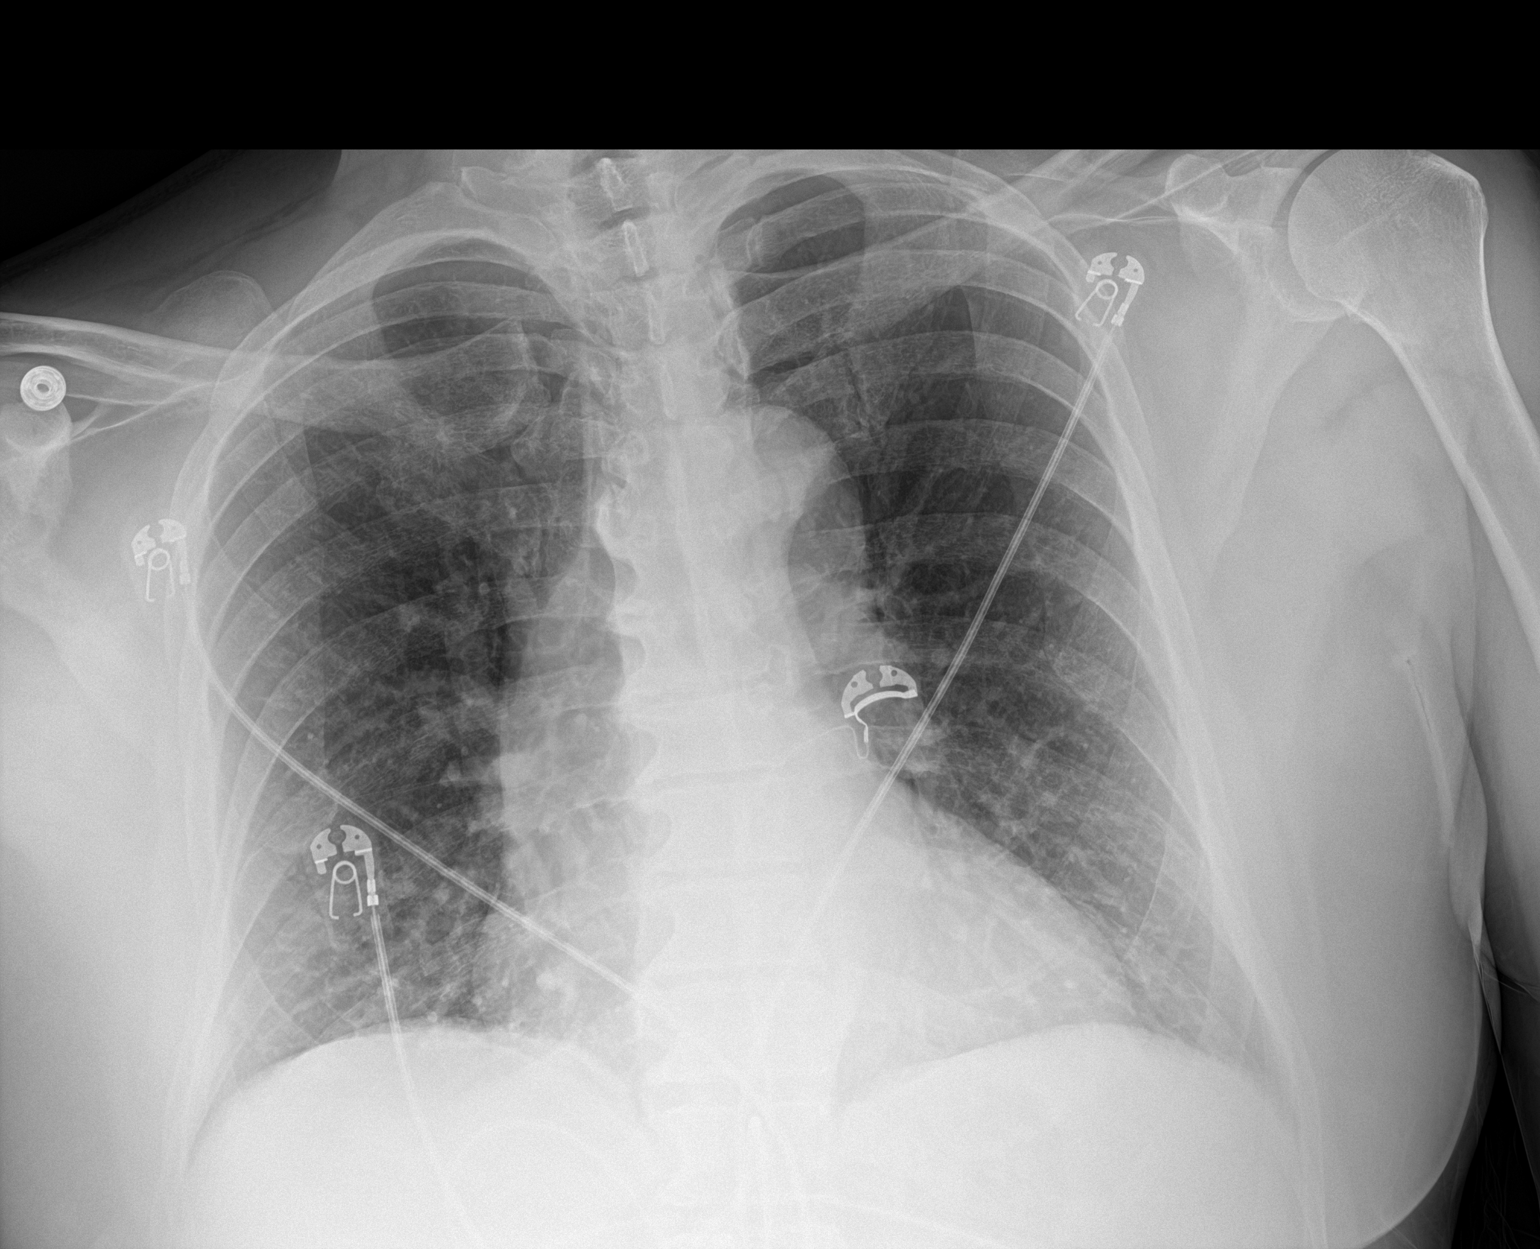

[1 of 1 positions shown; findings below may reference images not displayed]

FINDINGS: Cardiomegaly. Both lungs are clear. The visualized skeletal
structures are unremarkable.
IMPRESSION: Cardiomegaly without acute abnormality of the lungs in AP portable
projection.

## 2020-07-31 ENCOUNTER — Encounter: Payer: Self-pay | Admitting: Family

## 2020-11-28 ENCOUNTER — Ambulatory Visit: Payer: Self-pay

## 2020-11-28 ENCOUNTER — Other Ambulatory Visit: Payer: Self-pay

## 2020-11-29 ENCOUNTER — Encounter: Payer: Self-pay | Admitting: Family

## 2020-12-01 ENCOUNTER — Emergency Department (HOSPITAL_COMMUNITY)
Admission: EM | Admit: 2020-12-01 | Discharge: 2020-12-01 | Disposition: A | Discharge status: Home / Self Care | Payer: Self-pay | Attending: Emergency Medicine | Admitting: Emergency Medicine

## 2020-12-01 ENCOUNTER — Encounter (HOSPITAL_COMMUNITY): Payer: Self-pay

## 2020-12-01 ENCOUNTER — Other Ambulatory Visit: Payer: Self-pay

## 2020-12-01 DIAGNOSIS — Z21 Asymptomatic human immunodeficiency virus [HIV] infection status: Secondary | ICD-10-CM | POA: Insufficient documentation

## 2020-12-01 DIAGNOSIS — I10 Essential (primary) hypertension: Secondary | ICD-10-CM | POA: Insufficient documentation

## 2020-12-01 DIAGNOSIS — Z79899 Other long term (current) drug therapy: Secondary | ICD-10-CM | POA: Insufficient documentation

## 2020-12-01 DIAGNOSIS — K047 Periapical abscess without sinus: Secondary | ICD-10-CM | POA: Insufficient documentation

## 2020-12-01 DIAGNOSIS — K0889 Other specified disorders of teeth and supporting structures: Secondary | ICD-10-CM | POA: Insufficient documentation

## 2020-12-01 MED ORDER — LIDOCAINE VISCOUS HCL 2 % MT SOLN
15.0000 mL | OROMUCOSAL | 0 refills | Status: DC | PRN
Start: 2020-12-01 — End: 2021-01-17

## 2020-12-01 MED ORDER — AMOXICILLIN-POT CLAVULANATE 875-125 MG PO TABS: 1.0000 | ORAL_TABLET | Freq: Two times a day (BID) | ORAL | 0 refills | Status: AC

## 2020-12-01 MED ORDER — NAPROXEN 500 MG PO TABS: 500.0000 mg | ORAL_TABLET | Freq: Two times a day (BID) | ORAL | 0 refills | Status: DC

## 2020-12-01 NOTE — Discharge Instructions (Signed)
Take antibiotics as prescribed.  Take the entire course, even if symptoms improve. Take naproxen 2 times a day with meals.  Do not take other anti-inflammatories at the same time (Advil, Motrin, ibuprofen, Aleve). You may supplement with Tylenol if you need further pain control. Use viscous lidocaine as needed for pain.  Follow-up with your ID doctor to schedule appointment next week. Return to the emergency room if you develop high fevers, severe worsening pain, pain in or around your eye, inability to open up your mouth, or any new, worsening, or concerning symptoms.

## 2020-12-01 NOTE — ED Provider Notes (Signed)
[facility] EMERGENCY DEPARTMENT Provider Note   CSN: [id number] Arrival date & time: 12/01/20  8546     History Chief Complaint  Patient presents with  . Jaw Pain    [name] is a 55 y.o. adult presenting for evaluation of dental and jaw pain and facial swelling.  Patient states he developed pain of his right upper mouth and jaw 5 days ago.  Over the past 2 days, he has had swelling of his right upper mouth.  He denies fevers, chills, nasal congestion, sore throat, cough.  Has been taking Tylenol and swishing with hydrogen peroxide with minimal improvement of symptoms.  He reports a history of well-controlled HIV with undetectable viral count.  Additional history of hypertension.  He has not seen a dentist for many years, his ID doctor is working on getting him into a dental clinic.  HPI     Past Medical History:  Diagnosis Date  . Abnormal finding on GI tract imaging   . Allergy   . Candidiasis   . Hepatitis C   . Herpes iris   . HIV disease (HCC)   . Hypertension   . Hypokalemia   . Plantar nerve lesion   . Rectal bleeding   . Retinitis   . Syncope   . Ulcer   . Venereal wart     Patient Active Problem List   Diagnosis Date Noted  . High triglycerides 12/14/2018  . Health care maintenance 12/14/2018  . 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 Depression 10/05/2015  . Rash and nonspecific skin eruption 10/06/2013  . Dental abscess 06/02/2012  . 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 RETINITIS 07/11/2010  . ALLERGIES-SEASONAL 05/07/2010  . CANDIDIASIS, ORAL 08/21/2009  . ABNORMAL FINDINGS GI TRACT 01/13/2009  . HYPOKALEMIA 12/25/2008  . RECTAL BLEEDING 12/25/2008  . 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 Essential hypertension 03/23/2008  . SYNCOPE 01/12/2007  . Hepatitis C 09/27/2006  . 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 HX, PERSONAL, PAST NONCOMPLIANCE 09/27/2006  . Human immunodeficiency virus (HIV) disease (HCC)  02/06/2006    History reviewed. No pertinent surgical history.     Family History  Problem Relation Age of Onset  . Hypertension Sister   . Hypertension Brother   . Cancer Mother        pancreatic cancer    Social History   Tobacco Use  . Smoking status: Never Smoker  . Smokeless tobacco: Never Used  Substance Use Topics  . Alcohol use: No  . Drug use: No    Home Medications Prior to Admission medications   Medication Sig Start Date End Date Taking? Authorizing Provider  amoxicillin-clavulanate (AUGMENTIN) 875-125 MG tablet Take 1 tablet by mouth every 12 (twelve) hours for 10 days. 12/01/20 12/11/20 Yes Toniqua Melamed, PA-C  lidocaine (XYLOCAINE) 2 % solution Use as directed 15 mLs in the mouth or throat as needed for mouth pain. 12/01/20  Yes Dennie Moltz, PA-C  naproxen (NAPROSYN) 500 MG tablet Take 1 tablet (500 mg total) by mouth 2 (two) times daily with a meal. 12/01/20  Yes Vail Basista, PA-C  cephALEXin (KEFLEX) 500 MG capsule Take 1 capsule (500 mg total) by mouth 4 (four) times daily. 04/15/20   Upstill, Melvenia Beam, PA-C  darunavir-cobicistat (PREZCOBIX) 800-150 MG tablet TAKE 1 TABLET BY MOUTH DAILY WITH BREAKFAST. SWALLOW WHOLE. DO NOT CRUSH, BREAK OR CHEW. TAKE WITH FOOD 07/03/20   Veryl Speak, FNP  dolutegravir (TIVICAY) 50 MG tablet TAKE  1 TABLET(50 MG) BY MOUTH DAILY 07/03/20   Veryl Speak, FNP  emtricitabine-tenofovir AF (DESCOVY) 200-25 MG tablet Take 1 tablet by mouth daily. 07/03/20   Veryl Speak, FNP  ibuprofen (ADVIL) 600 MG tablet Take 1 tablet (600 mg total) by mouth every 6 (six) hours as needed. 04/15/20   Elpidio Anis, PA-C  lisinopril (ZESTRIL) 40 MG tablet TAKE 1 TABLET(40 MG) BY MOUTH DAILY 07/17/20   Veryl Speak, FNP  valACYclovir (VALTREX) 500 MG tablet TAKE 1 TABLET BY MOUTH TWICE DAILY 07/17/20   Veryl Speak, FNP  potassium chloride (K-DUR) 10 MEQ tablet Take 1 tablet (10 mEq total) by mouth once. Repeat labs at [facility]  the next day Patient not taking: Reported on 01/31/2016 10/10/15 02/10/16  Ginnie Smart, MD    Allergies    Apple  Review of Systems   Review of Systems  Constitutional: Negative for fever.  HENT: Positive for dental problem and facial swelling.     Physical Exam Updated Vital Signs BP (!) 136/97 (BP Location: Right Arm)   Pulse 90   Temp 98.4 F (36.9 C) (Oral)   Resp 16   SpO2 99%   Physical Exam Vitals and nursing note reviewed.  Constitutional:      General: She is not in acute distress.    Appearance: She is well-developed and well-nourished.     Comments: Sitting comfortably in the chair no acute distress  HENT:     Head: Normocephalic and atraumatic.     Mouth/Throat:     Dentition: Abnormal dentition. Dental tenderness, gingival swelling and dental caries present.     Comments: Overall poor dentition with multiple dental caries and multiple teeth missing.  Tenderness palpation of the right upper gum/broken teeth with gingival edema and tenderness.  Mild swelling of the right cheek noted.  No area of fluctuance.  No trismus.  OP clear without tonsillar swelling or exudate.  Uvula midline.  Handling secretions easily. Eyes:     Extraocular Movements: EOM normal.  Cardiovascular:     Rate and Rhythm: Normal rate and regular rhythm.  Pulmonary:     Effort: Pulmonary effort is normal.     Breath sounds: Normal breath sounds.  Abdominal:     General: There is no distension.  Musculoskeletal:        General: Normal range of motion.     Cervical back: Normal range of motion.  Skin:    General: Skin is warm.     Findings: No rash.  Neurological:     Mental Status: She is alert and oriented to person, place, and time.  Psychiatric:        Mood and Affect: Mood and affect normal.     ED Results / Procedures / Treatments   Labs (all labs ordered are listed, but only abnormal results are displayed) Labs Reviewed - No data to  display  EKG None  Radiology No results found.  Procedures Procedures (including critical care time)  Medications Ordered in ED Medications - No data to display  ED Course  I have reviewed the triage vital signs and the nursing notes.  Pertinent labs & imaging results that were available during my care of the patient were reviewed by me and considered in my medical decision making (see chart for details).    MDM Rules/Calculators/A&P  Patient presented for evaluation of dental pain and facial swelling.  On exam, patient peers nontoxic.  He does have mild swelling and tenderness palpation of the teeth.  Overall poor dentition.  Likely dental infection.  Will treat with antibiotics.  Exam is not consistent with Ludwick's or concerning deep space infection.  Patient is not septic.  Follow-up with ID, patient has an appointment scheduled for next week.  At this time, patient appears safe for discharged.  Return precautions given.  Patient states he understands agrees to plan  Final Clinical Impression(s) / ED Diagnoses Final diagnoses:  Pain, dental  Abscess, dental    Rx / DC Orders ED Discharge Orders         Ordered    amoxicillin-clavulanate (AUGMENTIN) 875-125 MG tablet  Every 12 hours        12/01/20 0829    naproxen (NAPROSYN) 500 MG tablet  2 times daily with meals        12/01/20 0829    lidocaine (XYLOCAINE) 2 % solution  As needed        12/01/20 0829           Alveria Apley, PA-C 12/01/20 0926    Virgina Norfolk, DO 12/01/20 1036

## 2020-12-01 NOTE — ED Triage Notes (Signed)
Pt c/o R lower jaw pain approx x5 days, swelling since Monday. Pt does not have regular dentist, has not had dentist appt in approx 32yrs. Swelling noted to lower R jaw area, tender to touch.

## 2021-01-03 ENCOUNTER — Other Ambulatory Visit: Payer: Self-pay

## 2021-01-08 ENCOUNTER — Other Ambulatory Visit: Payer: Self-pay

## 2021-01-08 DIAGNOSIS — B20 Human immunodeficiency virus [HIV] disease: Secondary | ICD-10-CM

## 2021-01-10 LAB — COMPREHENSIVE METABOLIC PANEL
AG Ratio: 1.7 (calc) (ref 1.0–2.5)
ALT: 12 U/L (ref 9–46)
AST: 14 U/L (ref 10–35)
Albumin: 4.5 g/dL (ref 3.6–5.1)
Alkaline phosphatase (APISO): 66 U/L (ref 35–144)
BUN: 12 mg/dL (ref 7–25)
CO2: 26 mmol/L (ref 20–32)
Calcium: 9.4 mg/dL (ref 8.6–10.3)
Chloride: 107 mmol/L (ref 98–110)
Creat: 0.84 mg/dL (ref 0.70–1.33)
Globulin: 2.7 g/dL (calc) (ref 1.9–3.7)
Glucose, Bld: 107 mg/dL — ABNORMAL HIGH (ref 65–99)
Potassium: 3.7 mmol/L (ref 3.5–5.3)
Sodium: 140 mmol/L (ref 135–146)
Total Bilirubin: 0.4 mg/dL (ref 0.2–1.2)
Total Protein: 7.2 g/dL (ref 6.1–8.1)

## 2021-01-10 LAB — CBC
HCT: 40.6 % (ref 38.5–50.0)
Hemoglobin: 14.3 g/dL (ref 13.2–17.1)
MCH: 34.7 pg — ABNORMAL HIGH (ref 27.0–33.0)
MCHC: 35.2 g/dL (ref 32.0–36.0)
MCV: 98.5 fL (ref 80.0–100.0)
MPV: 9.1 fL (ref 7.5–12.5)
Platelets: 240 10*3/uL (ref 140–400)
RBC: 4.12 10*6/uL — ABNORMAL LOW (ref 4.20–5.80)
RDW: 12.8 % (ref 11.0–15.0)
WBC: 3.9 10*3/uL (ref 3.8–10.8)

## 2021-01-10 LAB — HIV-1 RNA QUANT-NO REFLEX-BLD
HIV 1 RNA Quant: <20 Copies/mL — ABNORMAL HIGH
HIV-1 RNA Quant, Log: <1.3 Log cps/mL — ABNORMAL HIGH

## 2021-01-17 ENCOUNTER — Encounter: Payer: Self-pay | Admitting: Family

## 2021-01-17 ENCOUNTER — Other Ambulatory Visit: Payer: Self-pay

## 2021-01-17 ENCOUNTER — Telehealth (INDEPENDENT_AMBULATORY_CARE_PROVIDER_SITE_OTHER): Payer: Self-pay | Admitting: Family

## 2021-01-17 DIAGNOSIS — Z0000 Encounter for general adult medical examination without abnormal findings: Secondary | ICD-10-CM

## 2021-01-17 DIAGNOSIS — Z79899 Other long term (current) drug therapy: Secondary | ICD-10-CM

## 2021-01-17 DIAGNOSIS — Z113 Encounter for screening for infections with a predominantly sexual mode of transmission: Secondary | ICD-10-CM

## 2021-01-17 DIAGNOSIS — B20 Human immunodeficiency virus [HIV] disease: Secondary | ICD-10-CM

## 2021-01-17 MED ORDER — DESCOVY 200-25 MG PO TABS: 1.0000 | ORAL_TABLET | Freq: Every day | ORAL | 5 refills | Status: DC

## 2021-01-17 MED ORDER — PREZCOBIX 800-150 MG PO TABS: ORAL_TABLET | ORAL | 5 refills | Status: DC

## 2021-01-17 MED ORDER — TIVICAY 50 MG PO TABS: ORAL_TABLET | ORAL | 5 refills | Status: DC

## 2021-01-17 NOTE — Progress Notes (Signed)
Subjective:    Patient ID: [name], adult    DOB: January 30, 1966, 55 y.o.   MRN: [id number]  Chief Complaint  Patient presents with  . Follow-up     Virtual Visit via Telephone/Video Note   I connected with [name] on 01/17/2021 at 3:15 by telephone and verified that I am speaking with the correct person using two identifiers.   I discussed the limitations, risks, security and privacy concerns of performing an evaluation and management service by telephone and the availability of in person appointments. I also discussed with the patient that there may be a patient responsible charge related to this service. The patient expressed understanding and agreed to proceed.  Location:  Patient: Work  Provider: Clinic   HPI:  [name] is a 55 y.o. adult with HIV disease last seen on 07/03/20 with well controlled virus and good adherence and tolerance to her ART regimen of Tivicay, Descovy, and Prezcobix. Viral load was undetectable and CD4 count of 283. Most recent blood work in 01/08/21 with continued viral suppression. Telehealth visit today for routine follow up.  Mikaele continues to take Tivicay, Descovy and Prezcobix as prescribed with no adverse side effects or missed doses since the last office visit. Overall feeling wonderful today with no concerns/complaints. Denies fevers, chills, night sweats, headaches, changes in vision, neck pain/stiffness, nausea, diarrhea, vomiting, lesions or rashes.  Pinchas has no problems obtaining medications. Denies feelings of being down, depressed or hopeless. No recreational or illicit drug use, tobacco use or alcohol consumption. Due for routine dental care and will refer at next office visit.     Allergies  Allergen Reactions  . Apple Swelling      Outpatient Medications Prior to Visit  Medication Sig Dispense Refill  . Acetaminophen (TYLENOL PO) Take by mouth.    [name] ibuprofen (ADVIL) 600 MG tablet Take 1 tablet (600 mg  total) by mouth every 6 (six) hours as needed. 30 tablet 0  . lisinopril (ZESTRIL) 40 MG tablet TAKE 1 TABLET(40 MG) BY MOUTH DAILY 30 tablet 5  . valACYclovir (VALTREX) 500 MG tablet TAKE 1 TABLET BY MOUTH TWICE DAILY 60 tablet 5  . darunavir-cobicistat (PREZCOBIX) 800-150 MG tablet TAKE 1 TABLET BY MOUTH DAILY WITH BREAKFAST. SWALLOW WHOLE. DO NOT CRUSH, BREAK OR CHEW. TAKE WITH FOOD 30 tablet 5  . dolutegravir (TIVICAY) 50 MG tablet TAKE 1 TABLET(50 MG) BY MOUTH DAILY 30 tablet 5  . emtricitabine-tenofovir AF (DESCOVY) 200-25 MG tablet Take 1 tablet by mouth daily. 30 tablet 5  . cephALEXin (KEFLEX) 500 MG capsule Take 1 capsule (500 mg total) by mouth 4 (four) times daily. 28 capsule 0  . lidocaine (XYLOCAINE) 2 % solution Use as directed 15 mLs in the mouth or throat as needed for mouth pain. (Patient not taking: Reported on 01/17/2021) 100 mL 0  . naproxen (NAPROSYN) 500 MG tablet Take 1 tablet (500 mg total) by mouth 2 (two) times daily with a meal. (Patient not taking: Reported on 01/17/2021) 10 tablet 0   No facility-administered medications prior to visit.     Past Medical History:  Diagnosis Date  . Abnormal finding on GI tract imaging   . Allergy   . Candidiasis   . Hepatitis C   . Herpes iris   . HIV disease (HCC)   . Hypertension   . Hypokalemia   . Plantar nerve lesion   . Rectal bleeding   . Retinitis   . Syncope   .  Ulcer   . Venereal wart      History reviewed. No pertinent surgical history.     Review of Systems  Constitutional: Negative for appetite change, chills, diaphoresis, fatigue, fever and unexpected weight change.  Eyes:       Negative for acute change in vision  Respiratory: Negative for chest tightness, shortness of breath and wheezing.   Cardiovascular: Negative for chest pain.  Gastrointestinal: Negative for diarrhea, nausea and vomiting.  Genitourinary: Negative for dysuria, pelvic pain and vaginal discharge.  Musculoskeletal: Negative for  neck pain and neck stiffness.  Skin: Negative for rash.  Neurological: Negative for seizures, syncope, weakness and headaches.  Hematological: Negative for adenopathy. Does not bruise/bleed easily.  Psychiatric/Behavioral: Negative for hallucinations.      Objective:    Nursing note and vital signs reviewed.    Atreyu is pleasant to speak with and sounds to be in no apparent distress.  Physical exam limited secondary to telehealth visit.  Assessment & Plan:   Problem List Items Addressed This Visit      Other   Human immunodeficiency virus (HIV) disease (HCC)    [name] continues to have well-controlled HIV disease with good adherence and tolerance to Tivicay, Descovy, Prezcobix.  No signs/symptoms of opportunistic infection or progressive HIV disease.  We reviewed lab work and discussed plan of care.  Continue current dose of Tivicay, Descovy, and Prezcobix.  Plan for follow-up in 6 months or sooner if needed with lab work 1 to 2 weeks prior to appointment.      Relevant Medications   dolutegravir (TIVICAY) 50 MG tablet   emtricitabine-tenofovir AF (DESCOVY) 200-25 MG tablet   darunavir-cobicistat (PREZCOBIX) 800-150 MG tablet   Other Relevant Orders   HIV-1 RNA quant-no reflex-bld   T-helper cell (CD4)- ([facility] clinic only)   Comprehensive metabolic panel   Health care maintenance     Discussed importance of safe sexual practices to reduce risk of STI.  Due for routine dental care with referral to be placed at next office visit.  Vaccines up to date per recommendations.        Other Visit Diagnoses    Screening for STDs (sexually transmitted diseases)    -  Primary   Relevant Orders   RPR   Pharmacologic therapy       Relevant Orders   Lipid panel       I have discontinued Cassell T. Larimer's cephALEXin, naproxen, and lidocaine. I am also having her maintain her ibuprofen, valACYclovir, lisinopril, Acetaminophen (TYLENOL PO), Tivicay, Descovy, and  Prezcobix.   Meds ordered this encounter  Medications  . dolutegravir (TIVICAY) 50 MG tablet    Sig: TAKE 1 TABLET(50 MG) BY MOUTH DAILY    Dispense:  30 tablet    Refill:  5    Order Specific Question:   Supervising Provider    Answer:   Judyann Munson [[id number]]  . emtricitabine-tenofovir AF (DESCOVY) 200-25 MG tablet    Sig: Take 1 tablet by mouth daily.    Dispense:  30 tablet    Refill:  5    Order Specific Question:   Supervising Provider    Answer:   Judyann Munson [[id number]]  . darunavir-cobicistat (PREZCOBIX) 800-150 MG tablet    Sig: TAKE 1 TABLET BY MOUTH DAILY WITH BREAKFAST. SWALLOW WHOLE. DO NOT CRUSH, BREAK OR CHEW. TAKE WITH FOOD    Dispense:  30 tablet    Refill:  5    Order Specific Question:   Supervising Provider  Answer:   Judyann Munson [phone]     I discussed the assessment and treatment plan with the patient. The patient was provided an opportunity to ask questions and all were answered. The patient agreed with the plan and demonstrated an understanding of the instructions.   The patient was advised to call back or seek an in-person evaluation if the symptoms worsen or if the condition fails to improve as anticipated.   I provided  8 minutes of non-face-to-face time during this encounter.  Follow-up: Return in about 6 months (around 07/20/2021), or if symptoms worsen or fail to improve.   Marcos Eke, MSN, FNP-C Nurse Practitioner [facility] for Infectious Disease [facility] Main number: [phone]

## 2021-01-17 NOTE — Assessment & Plan Note (Signed)
   Discussed importance of safe sexual practices to reduce risk of STI.  Due for routine dental care with referral to be placed at next office visit.  Vaccines up to date per recommendations.

## 2021-01-17 NOTE — Assessment & Plan Note (Signed)
[name] continues to have well-controlled HIV disease with good adherence and tolerance to Tivicay, Descovy, Prezcobix.  No signs/symptoms of opportunistic infection or progressive HIV disease.  We reviewed lab work and discussed plan of care.  Continue current dose of Tivicay, Descovy, and Prezcobix.  Plan for follow-up in 6 months or sooner if needed with lab work 1 to 2 weeks prior to appointment.

## 2021-01-17 NOTE — Patient Instructions (Signed)
Nice to speak with you.  Continue to take your Tivicay, Descovy, and Prezcobix daily as prescribed.  Refills of been sent to the pharmacy.  Plan for follow-up in 6 months or sooner if needed with lab work 1 to 2 weeks prior to appointment.

## 2021-01-19 ENCOUNTER — Other Ambulatory Visit: Payer: Self-pay | Admitting: Family

## 2021-01-19 DIAGNOSIS — I10 Essential (primary) hypertension: Secondary | ICD-10-CM

## 2021-01-19 DIAGNOSIS — B009 Herpesviral infection, unspecified: Secondary | ICD-10-CM

## 2021-01-19 DIAGNOSIS — B20 Human immunodeficiency virus [HIV] disease: Secondary | ICD-10-CM

## 2021-06-08 ENCOUNTER — Encounter: Payer: Self-pay | Admitting: Family

## 2021-06-08 ENCOUNTER — Other Ambulatory Visit: Payer: Self-pay

## 2021-06-08 ENCOUNTER — Ambulatory Visit: Payer: Self-pay

## 2021-07-21 ENCOUNTER — Other Ambulatory Visit: Payer: Self-pay | Admitting: Family

## 2021-07-21 DIAGNOSIS — I10 Essential (primary) hypertension: Secondary | ICD-10-CM

## 2021-07-21 DIAGNOSIS — B20 Human immunodeficiency virus [HIV] disease: Secondary | ICD-10-CM

## 2021-08-26 ENCOUNTER — Other Ambulatory Visit: Payer: Self-pay | Admitting: Family

## 2021-08-26 DIAGNOSIS — B009 Herpesviral infection, unspecified: Secondary | ICD-10-CM

## 2021-08-26 DIAGNOSIS — B20 Human immunodeficiency virus [HIV] disease: Secondary | ICD-10-CM

## 2021-08-27 ENCOUNTER — Emergency Department (HOSPITAL_COMMUNITY): Payer: Self-pay

## 2021-08-27 ENCOUNTER — Emergency Department (HOSPITAL_COMMUNITY)
Admission: EM | Admit: 2021-08-27 | Discharge: 2021-08-27 | Disposition: A | Discharge status: Home / Self Care | Payer: Self-pay | Attending: Emergency Medicine | Admitting: Emergency Medicine

## 2021-08-27 ENCOUNTER — Other Ambulatory Visit: Payer: Self-pay

## 2021-08-27 DIAGNOSIS — Z21 Asymptomatic human immunodeficiency virus [HIV] infection status: Secondary | ICD-10-CM | POA: Insufficient documentation

## 2021-08-27 DIAGNOSIS — K0889 Other specified disorders of teeth and supporting structures: Secondary | ICD-10-CM | POA: Insufficient documentation

## 2021-08-27 DIAGNOSIS — R0602 Shortness of breath: Secondary | ICD-10-CM | POA: Insufficient documentation

## 2021-08-27 DIAGNOSIS — I10 Essential (primary) hypertension: Secondary | ICD-10-CM | POA: Insufficient documentation

## 2021-08-27 DIAGNOSIS — R110 Nausea: Secondary | ICD-10-CM | POA: Insufficient documentation

## 2021-08-27 DIAGNOSIS — Z79899 Other long term (current) drug therapy: Secondary | ICD-10-CM | POA: Insufficient documentation

## 2021-08-27 LAB — CBC WITH DIFFERENTIAL/PLATELET
Abs Immature Granulocytes: 0.01 10*3/uL (ref 0.00–0.07)
Basophils Absolute: 0 10*3/uL (ref 0.0–0.1)
Basophils Relative: 0 %
Eosinophils Absolute: 0.3 10*3/uL (ref 0.0–0.5)
Eosinophils Relative: 5 %
HCT: 40.9 % (ref 39.0–52.0)
Hemoglobin: 14.5 g/dL (ref 13.0–17.0)
Immature Granulocytes: 0 %
Lymphocytes Relative: 25 %
Lymphs Abs: 1.3 10*3/uL (ref 0.7–4.0)
MCH: 34.7 pg — ABNORMAL HIGH (ref 26.0–34.0)
MCHC: 35.5 g/dL (ref 30.0–36.0)
MCV: 97.8 fL (ref 80.0–100.0)
Monocytes Absolute: 0.5 10*3/uL (ref 0.1–1.0)
Monocytes Relative: 10 %
Neutro Abs: 3.1 10*3/uL (ref 1.7–7.7)
Neutrophils Relative %: 60 %
Platelets: 224 10*3/uL (ref 150–400)
RBC: 4.18 MIL/uL — ABNORMAL LOW (ref 4.22–5.81)
RDW: 12.1 % (ref 11.5–15.5)
WBC: 5.2 10*3/uL (ref 4.0–10.5)
nRBC: 0 % (ref 0.0–0.2)

## 2021-08-27 LAB — COMPREHENSIVE METABOLIC PANEL
ALT: 16 U/L (ref 0–44)
AST: 17 U/L (ref 15–41)
Albumin: 4 g/dL (ref 3.5–5.0)
Alkaline Phosphatase: 63 U/L (ref 38–126)
Anion gap: 10 (ref 5–15)
BUN: 11 mg/dL (ref 6–20)
CO2: 21 mmol/L — ABNORMAL LOW (ref 22–32)
Calcium: 9 mg/dL (ref 8.9–10.3)
Chloride: 106 mmol/L (ref 98–111)
Creatinine, Ser: 0.75 mg/dL (ref 0.61–1.24)
GFR, Estimated: >60 mL/min (ref >60)
Glucose, Bld: 111 mg/dL — ABNORMAL HIGH (ref 70–99)
Potassium: 3.3 mmol/L — ABNORMAL LOW (ref 3.5–5.1)
Sodium: 137 mmol/L (ref 135–145)
Total Bilirubin: 0.7 mg/dL (ref 0.3–1.2)
Total Protein: 7.5 g/dL (ref 6.5–8.1)

## 2021-08-27 MED ORDER — CLINDAMYCIN HCL 150 MG PO CAPS
300.0000 mg | ORAL_CAPSULE | Freq: Once | ORAL | Status: AC
  Administered 2021-08-27: 300 mg via ORAL
  Filled 2021-08-27: qty 2

## 2021-08-27 MED ORDER — ALBUTEROL SULFATE HFA 108 (90 BASE) MCG/ACT IN AERS: 2.0000 | INHALATION_SPRAY | RESPIRATORY_TRACT | Status: DC | PRN

## 2021-08-27 MED ORDER — KETOROLAC TROMETHAMINE 15 MG/ML IJ SOLN
15.0000 mg | Freq: Once | INTRAMUSCULAR | Status: AC
  Administered 2021-08-27: 15 mg via INTRAMUSCULAR
  Filled 2021-08-27: qty 1

## 2021-08-27 MED ORDER — CLINDAMYCIN HCL 300 MG PO CAPS: 300.0000 mg | ORAL_CAPSULE | Freq: Four times a day (QID) | ORAL | 0 refills | Status: AC

## 2021-08-27 NOTE — ED Provider Notes (Signed)
[facility] EMERGENCY DEPARTMENT Provider Note   CSN: [id number] Arrival date & time: 08/27/21  0139     History Chief Complaint  Patient presents with   Dental Pain   Nausea   Shortness of Breath    [name] is a 55 y.o. adult.  55 yo M with a chief complaints of left lower dental pain.  Been going on for some time but worsening over the past 24 hours.  He had an episode where he felt short of breath and then nauseated and came to the ED.  No fevers feels pain radiating to the left side of his face feels like his lymph nodes may be swollen on that side.  No difficulty swallowing or breathing currently.  The history is provided by the patient.  Dental Pain Location:  Lower Lower teeth location:  18/LL 2nd molar Quality:  Aching Severity:  Moderate Onset quality:  Gradual Duration:  2 days Timing:  Constant Progression:  Unchanged Chronicity:  New Context: dental caries and dental fracture   Relieved by:  Nothing Worsened by:  Nothing Ineffective treatments:  None tried Associated symptoms: no congestion, no facial swelling, no fever and no headaches   Shortness of Breath Associated symptoms: no abdominal pain, no chest pain, no fever, no headaches, no rash and no vomiting       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   Depression 10/05/2015   Rash and nonspecific skin eruption 10/06/2013   Dental abscess 06/02/2012   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     Family History  Problem Relation Age of Onset   Hypertension Sister    Hypertension Brother    Cancer Mother        pancreatic cancer    Social History   Tobacco Use   Smoking status: Never   Smokeless tobacco: Never  Substance Use Topics   Alcohol use: No   Drug use: No    Home Medications Prior to Admission medications   Medication Sig Start Date End Date Taking? Authorizing Provider  clindamycin (CLEOCIN) 300 MG capsule Take 1 capsule (300 mg total) by mouth 4 (four) times daily for 7 days. 08/27/21 09/03/21 Yes Melene Plan, DO  Acetaminophen (TYLENOL PO) Take by mouth.    [provider]  DESCOVY 200-25 MG tablet TAKE 1 TABLET BY MOUTH DAILY 07/25/21   Veryl Speak, FNP  ibuprofen (ADVIL) 600 MG tablet Take  1 tablet (600 mg total) by mouth every 6 (six) hours as needed. 04/15/20   Elpidio Anis, PA-C  lisinopril (ZESTRIL) 40 MG tablet TAKE 1 TABLET(40 MG) BY MOUTH DAILY 07/25/21   Veryl Speak, FNP  PREZCOBIX 800-150 MG tablet TAKE 1 TABLET BY MOUTH DAILY WITH BREAKFAST. SWALLOW WHOLE. DO NOT CRUSH, BREAK OR CHEW. TAKE WITH FOOD 07/25/21   Veryl Speak, FNP  TIVICAY 50 MG tablet TAKE 1 TABLET(50 MG) BY MOUTH DAILY 07/25/21   Veryl Speak, FNP  valACYclovir (VALTREX) 500 MG tablet TAKE 1 TABLET BY MOUTH TWICE DAILY 01/19/21   Veryl Speak, FNP  potassium chloride (K-DUR) 10 MEQ tablet Take 1 tablet (10 mEq total) by mouth once. Repeat labs at [facility] the next day Patient not taking: Reported on 01/31/2016 10/10/15 02/10/16  Ginnie Smart, MD    Allergies    Apple  Review of Systems   Review of Systems  Constitutional:  Negative for chills and fever.  HENT:  Positive for dental problem. Negative for congestion and facial swelling.   Eyes:  Negative for discharge and visual disturbance.  Respiratory:  Positive for shortness of breath.   Cardiovascular:  Negative for chest pain and palpitations.  Gastrointestinal:  Positive for nausea. Negative for abdominal pain, diarrhea and vomiting.  Musculoskeletal:  Negative for arthralgias and myalgias.  Skin:  Negative for color change and rash.  Neurological:  Negative for tremors, syncope and headaches.  Psychiatric/Behavioral:  Negative for confusion and dysphoric mood.    Physical Exam Updated Vital Signs BP (!) 154/94 (BP Location: Left Arm)   Pulse 75   Temp 98.4 F (36.9 C) (Oral)   Resp 16   Ht 6' (1.829 m)   Wt 111.1 kg   SpO2 100%   BMI 33.23 kg/m   Physical Exam Vitals and nursing note reviewed.  Constitutional:      General: She is not in acute distress.    Appearance: She is well-developed. She is not diaphoretic.  HENT:     Head: Normocephalic and atraumatic.     Comments: Poor dentition globally, left posterior molar with a fairly large erosion into the gumline.  Tooth 1 forward with complete erosion at the gumline.  No sublingual swelling tolerating secretions without difficulty no obvious edema to the face or the submandibular area.  Able to range his neck without issue. Eyes:     Pupils: Pupils are equal, round, and reactive to light.  Cardiovascular:     Rate and Rhythm: Normal rate and regular rhythm.     Heart sounds: No murmur heard.   No friction rub. No gallop.  Pulmonary:     Effort: Pulmonary effort is normal.     Breath sounds: No wheezing or rales.  Abdominal:     General: There is no distension.     Palpations: Abdomen is soft.     Tenderness: There is no abdominal tenderness.  Musculoskeletal:        General: No  tenderness.     Cervical back: Normal range of motion and neck supple.  Skin:    General: Skin is warm and dry.  Neurological:     Mental Status: She is alert and oriented to person, place, and time.  Psychiatric:        Behavior: Behavior normal.    ED Results / Procedures / Treatments   Labs (all labs ordered are listed, but only abnormal results are displayed) Labs Reviewed  CBC WITH DIFFERENTIAL/PLATELET -  Abnormal; Notable for the following components:      Result Value   RBC 4.18 (*)    MCH 34.7 (*)    All other components within normal limits  COMPREHENSIVE METABOLIC PANEL - Abnormal; Notable for the following components:   Potassium 3.3 (*)    CO2 21 (*)    Glucose, Bld 111 (*)    All other components within normal limits    EKG EKG Interpretation  Date/Time:  Monday August 27 2021 01:49:35 EDT Ventricular Rate:  77 PR Interval:  176 QRS Duration: 88 QT Interval:  388 QTC Calculation: 439 R Axis:   -1 Text Interpretation: Normal sinus rhythm Normal ECG No significant change since last tracing Confirmed by Melene Plan [phone]) on 08/27/2021 8:11:35 AM  Radiology DG Chest 2 View  Result Date: 08/27/2021 CLINICAL DATA:  Shortness of breath. Left lower dental pain, nausea, and shortness of breath since yesterday. EXAM: CHEST - 2 VIEW COMPARISON:  12/10/2019 FINDINGS: Mild cardiac enlargement. No vascular congestion, edema, or consolidation. No pleural effusions. No pneumothorax. Mediastinal contours appear intact. Degenerative changes in the spine. IMPRESSION: No active cardiopulmonary disease. Electronically Signed   By: Burman Nieves M.D.   On: 08/27/2021 02:38    Procedures Procedures   Medications Ordered in ED Medications  albuterol (VENTOLIN HFA) 108 (90 Base) MCG/ACT inhaler 2 puff (has no administration in time range)  ketorolac (TORADOL) 15 MG/ML injection 15 mg (has no administration in time range)  clindamycin (CLEOCIN) capsule 300 mg (has no  administration in time range)    ED Course  I have reviewed the triage vital signs and the nursing notes.  Pertinent labs & imaging results that were available during my care of the patient were reviewed by me and considered in my medical decision making (see chart for details).    MDM Rules/Calculators/A&P                           55 yo M with a chief complaints of left lower dental pain.  Going on since yesterday.  Patient with poor dentition, no obvious signs of abscess.  Patient had a transient episode where he felt nauseated and short of breath.  This is completely resolved.  He had blood work and a chest x-ray and EKG performed in triage that are unremarkable.  Chest x-ray viewed by me without focal infiltrate no significant anemia.  Will give a dose of Toradol here.  Started on antibiotics.  Dentistry follow-up.  8:48 AM:  I have discussed the diagnosis/risks/treatment options with the patient and believe the pt to be eligible for discharge home to follow-up with Dentist. We also discussed returning to the ED immediately if new or worsening sx occur. We discussed the sx which are most concerning (e.g., sudden worsening pain, fever, inability to tolerate by mouth) that necessitate immediate return. Medications administered to the patient during their visit and any new prescriptions provided to the patient are listed below.  Medications given during this visit Medications  albuterol (VENTOLIN HFA) 108 (90 Base) MCG/ACT inhaler 2 puff (has no administration in time range)  ketorolac (TORADOL) 15 MG/ML injection 15 mg (has no administration in time range)  clindamycin (CLEOCIN) capsule 300 mg (has no administration in time range)     The patient appears reasonably screen and/or stabilized for discharge and I doubt any other medical condition or other [facility] requiring further screening, evaluation, or treatment in the ED at this time  prior to discharge.   Final Clinical Impression(s) / ED  Diagnoses Final diagnoses:  Pain, dental    Rx / DC Orders ED Discharge Orders          Ordered    clindamycin (CLEOCIN) 300 MG capsule  4 times daily        08/27/21 0837             Melene Plan, DO 08/27/21 0848

## 2021-08-27 NOTE — Discharge Instructions (Addendum)
Take 4 over the counter ibuprofen tablets 3 times a day or 2 over-the-counter naproxen tablets twice a day for pain. Also take tylenol 1000mg (2 extra strength) four times a day.    Please try and find a dentist on this list to see you.  Take the antibiotics as prescribed.  Please return for fever or difficulty swallowing swelling that extends into your neck.

## 2021-08-27 NOTE — ED Triage Notes (Addendum)
Pt c/o left side lower dental pain that started this morning. Pt also reports nausea.Pt also c/o shortness of breath.

## 2021-08-27 NOTE — ED Provider Notes (Signed)
Emergency Medicine Provider Triage Evaluation Note  [name] , a 56 y.o. adult  was evaluated in triage.  Pt complains of left lower dental pain for past few days and development of nausea/dyspnea tonight. States pain is to the left lower molar, constant. Woke up tonight with continued pain but also felt nauseous & short of breath which was concerning prompting ED visit.   Review of Systems  Positive: Dental pain, nausea, dyspnea Negative: Cough, vomiting, syncope, chest pain, dysphagia  Physical Exam  BP 126/85   Pulse 76   Temp 98.4 F (36.9 C) (Oral)   Resp 17   Ht 6' (1.829 m)   Wt 111.1 kg   SpO2 97%   BMI 33.23 kg/m  Gen:   Awake, no distress   Resp:  Normal effort  MSK:   Moves extremities without difficulty  Other:  L 3rd molar is decayed with surrounding gingiva swelling/erythema. Tolerating own secretions without difficulty. No submandibular swelling.   Medical Decision Making  Medically screening exam initiated at 2:03 AM.  Appropriate orders placed.  [name] was informed that the remainder of the evaluation will be completed by another provider, this initial triage assessment does not replace that evaluation, and the importance of remaining in the ED until their evaluation is complete.  Overall sxs may be discomfort related, however EKG, CXR, and basic labs to further evaluate   Cherly Anderson, PA-C 08/27/21 0205    Mesner, Barbara Cower, MD 08/27/21 2320

## 2021-09-14 ENCOUNTER — Other Ambulatory Visit: Payer: Self-pay | Admitting: Family

## 2021-09-14 DIAGNOSIS — B009 Herpesviral infection, unspecified: Secondary | ICD-10-CM

## 2021-09-14 DIAGNOSIS — B20 Human immunodeficiency virus [HIV] disease: Secondary | ICD-10-CM

## 2021-09-22 ENCOUNTER — Other Ambulatory Visit: Payer: Self-pay | Admitting: Family

## 2021-09-22 DIAGNOSIS — I10 Essential (primary) hypertension: Secondary | ICD-10-CM

## 2021-09-22 DIAGNOSIS — B20 Human immunodeficiency virus [HIV] disease: Secondary | ICD-10-CM

## 2021-09-24 NOTE — Telephone Encounter (Signed)
Okay to refill lisinopril?

## 2021-09-27 ENCOUNTER — Ambulatory Visit (INDEPENDENT_AMBULATORY_CARE_PROVIDER_SITE_OTHER): Payer: Self-pay | Admitting: Family

## 2021-09-27 ENCOUNTER — Other Ambulatory Visit: Payer: Self-pay

## 2021-09-27 ENCOUNTER — Encounter: Payer: Self-pay | Admitting: Family

## 2021-09-27 VITALS — BP 136/68 | HR 90 | Temp 98.3°F | Resp 16 | Ht 72.0 in | Wt 240.6 lb

## 2021-09-27 DIAGNOSIS — Z23 Encounter for immunization: Secondary | ICD-10-CM

## 2021-09-27 DIAGNOSIS — B20 Human immunodeficiency virus [HIV] disease: Secondary | ICD-10-CM

## 2021-09-27 DIAGNOSIS — B009 Herpesviral infection, unspecified: Secondary | ICD-10-CM

## 2021-09-27 DIAGNOSIS — Z0000 Encounter for general adult medical examination without abnormal findings: Secondary | ICD-10-CM

## 2021-09-27 DIAGNOSIS — I10 Essential (primary) hypertension: Secondary | ICD-10-CM

## 2021-09-27 MED ORDER — TIVICAY 50 MG PO TABS: 50.0000 mg | ORAL_TABLET | Freq: Every day | ORAL | 6 refills | Status: DC

## 2021-09-27 MED ORDER — DESCOVY 200-25 MG PO TABS: 1.0000 | ORAL_TABLET | Freq: Every day | ORAL | 6 refills | Status: DC

## 2021-09-27 MED ORDER — LISINOPRIL 40 MG PO TABS: 40.0000 mg | ORAL_TABLET | Freq: Every day | ORAL | 6 refills | Status: DC

## 2021-09-27 MED ORDER — VALACYCLOVIR HCL 500 MG PO TABS: 500.0000 mg | ORAL_TABLET | Freq: Two times a day (BID) | ORAL | 5 refills | Status: DC

## 2021-09-27 MED ORDER — PREZCOBIX 800-150 MG PO TABS: ORAL_TABLET | ORAL | 6 refills | Status: DC

## 2021-09-27 NOTE — Progress Notes (Signed)
Patient ID: [name], adult    DOB: 1966/07/06, 55 y.o.   MRN: [id number]  Subjective:    Chief Complaint  Patient presents with   Follow-up    B20 -     HPI:  [name] is a 55 y.o. adult with HIV disease last seen on 01/17/2021.  Telehealth visit with good adherence and tolerance to her ART regimen of Tivicay, Descovy, and Prezcobix.  Viral load was undetectable with CD4 count previously 283.  Here today for routine follow-up.  Gid continues to take Tivicay, Descovy, and Prezcobix with no adverse side effects.  Overall feeling well today with no new concerns/complaints. Denies fevers, chills, night sweats, headaches, changes in vision, neck pain/stiffness, nausea, diarrhea, vomiting, lesions or rashes.  [name] has no problems obtaining medication from the pharmacy remains covered through [employer].[name]  Denies feelings of being down, depressed, or hopeless recently.  No current recreational illicit drug use, tobacco use, or alcohol consumption.  Condoms offered.  Healthcare maintenance due includes influenza vaccine.  Requesting refills of medications.   Allergies  Allergen Reactions   Apple Swelling      Outpatient Medications Prior to Visit  Medication Sig Dispense Refill   Acetaminophen (TYLENOL PO) Take by mouth.     DESCOVY 200-25 MG tablet TAKE 1 TABLET BY MOUTH DAILY 30 tablet 0   lisinopril (ZESTRIL) 40 MG tablet TAKE 1 TABLET(40 MG) BY MOUTH DAILY 30 tablet 1   PREZCOBIX 800-150 MG tablet TAKE 1 TABLET BY MOUTH DAILY WITH BREAKFAST. SWALLOW WHOLE. DO NOT CRUSH, BREAK OR CHEW. TAKE WITH FOOD 30 tablet 0   TIVICAY 50 MG tablet TAKE 1 TABLET(50 MG) BY MOUTH DAILY 30 tablet 0   valACYclovir (VALTREX) 500 MG tablet TAKE 1 TABLET BY MOUTH TWICE DAILY 60 tablet 0   ibuprofen (ADVIL) 600 MG tablet Take 1 tablet (600 mg total) by mouth every 6 (six) hours as needed. (Patient not taking: Reported on 09/27/2021) 30 tablet 0   No facility-administered  medications prior to visit.     Past Medical History:  Diagnosis Date   Abnormal finding on GI tract imaging    Allergy    Candidiasis    Hepatitis C    Herpes iris    HIV disease (HCC)    Hypertension    Hypokalemia    Plantar nerve lesion    Rectal bleeding    Retinitis    Syncope    Ulcer    Venereal wart      History reviewed. No pertinent surgical history.    Review of Systems  Constitutional:  Negative for appetite change, chills, diaphoresis, fatigue, fever and unexpected weight change.  Eyes:        Negative for acute change in vision  Respiratory:  Negative for chest tightness, shortness of breath and wheezing.   Cardiovascular:  Negative for chest pain.  Gastrointestinal:  Negative for diarrhea, nausea and vomiting.  Genitourinary:  Negative for dysuria, pelvic pain and vaginal discharge.  Musculoskeletal:  Negative for neck pain and neck stiffness.  Skin:  Negative for rash.  Neurological:  Negative for seizures, syncope, weakness and headaches.  Hematological:  Negative for adenopathy. Does not bruise/bleed easily.  Psychiatric/Behavioral:  Negative for hallucinations.      Objective:    BP 136/68   Pulse 90   Temp 98.3 F (36.8 C) (Temporal)   Resp 16   Ht 6' (1.829 m)   Wt 240 lb 9.6 oz (109.1 kg)  SpO2 97%   BMI 32.63 kg/m  Nursing note and vital signs reviewed.  Physical Exam Constitutional:      General: He is not in acute distress.    Appearance: He is well-developed.  Eyes:     Conjunctiva/sclera: Conjunctivae normal.  Cardiovascular:     Rate and Rhythm: Normal rate and regular rhythm.     Heart sounds: Normal heart sounds. No murmur heard.   No friction rub. No gallop.  Pulmonary:     Effort: Pulmonary effort is normal. No respiratory distress.     Breath sounds: Normal breath sounds. No wheezing or rales.  Chest:     Chest wall: No tenderness.  Abdominal:     General: Bowel sounds are normal.     Palpations: Abdomen is  soft.     Tenderness: There is no abdominal tenderness.  Musculoskeletal:     Cervical back: Neck supple.  Lymphadenopathy:     Cervical: No cervical adenopathy.  Skin:    General: Skin is warm and dry.     Findings: No rash.  Neurological:     Mental Status: He is alert and oriented to person, place, and time.  Psychiatric:        Behavior: Behavior normal.        Thought Content: Thought content normal.        Judgment: Judgment normal.     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   RETINITIS 07/11/2010   ALLERGIES-SEASONAL 05/07/2010   CANDIDIASIS, ORAL 08/21/2009   ABNORMAL FINDINGS GI TRACT 01/13/2009   HYPOKALEMIA 12/25/2008   RECTAL BLEEDING 12/25/2008   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  No cough or other side effects.  Encouraged to continue monitoring blood pressures at home as able.  Continue current dose of lisinopril      Relevant Medications   lisinopril (ZESTRIL) 40 MG tablet     Other   Human immunodeficiency virus (HIV) disease (HCC)     Ruel continues to have well-controlled virus with good adherence and tolerance to Tivicay, Prezcobix, and Descovy.  No signs/symptoms of opportunistic infection.  We reviewed previous lab work and discussed plan of care.  Continue current dose of Tivicay, Prezcobix, and Descovy.  Plan for follow-up in 6 months or sooner if needed with lab work on the same day.      Relevant Medications   emtricitabine-tenofovir AF (DESCOVY) 200-25 MG tablet   darunavir-cobicistat (PREZCOBIX) 800-150 MG tablet   dolutegravir (TIVICAY) 50 MG  tablet   lisinopril (ZESTRIL) 40 MG tablet   valACYclovir (VALTREX) 500 MG tablet   Other Relevant Orders   HIV-1 RNA quant-no reflex-bld   T-helper cell (CD4)- ([facility] clinic only)   Health care maintenance    Discussed importance of safe sexual practice and condom usage.  Condoms offered. Influenza vaccine updated today.      Other Visit Diagnoses     Need for immunization against influenza    -  Primary   Relevant Orders   Flu Vaccine QUAD 44mo+IM (Fluarix, Fluzone & Alfiuria Quad PF) (Completed)   Herpes       Relevant Medications   emtricitabine-tenofovir AF (DESCOVY) 200-25 MG tablet   darunavir-cobicistat (PREZCOBIX) 800-150 MG tablet   dolutegravir (TIVICAY) 50 MG tablet   valACYclovir (VALTREX) 500 MG tablet        I have changed [facility]'s Descovy, Prezcobix, Tivicay, lisinopril, and valACYclovir. I am also having him maintain his ibuprofen and Acetaminophen (TYLENOL PO).   Meds ordered this encounter  Medications   emtricitabine-tenofovir AF (DESCOVY) 200-25 MG tablet    Sig: Take 1 tablet by mouth daily.    Dispense:  30 tablet    Refill:  6    Order Specific Question:   Supervising Provider    Answer:   Drue Second, CYNTHIA [[id number]]   darunavir-cobicistat (PREZCOBIX) 800-150 MG tablet    Sig: Swallow whole. Do NOT crush, break or chew tablets. Take with food.    Dispense:  30 tablet    Refill:  6    Order Specific Question:    Supervising Provider    Answer:   Drue Second, CYNTHIA [[id number]]   dolutegravir (TIVICAY) 50 MG tablet    Sig: Take 1 tablet (50 mg total) by mouth daily.    Dispense:  30 tablet    Refill:  6    Order Specific Question:   Supervising Provider    Answer:   Drue Second, CYNTHIA [[id number]]   lisinopril (ZESTRIL) 40 MG tablet    Sig: Take 1 tablet (40 mg total) by mouth daily.    Dispense:  30 tablet    Refill:  6    Order Specific Question:   Supervising Provider    Answer:   Drue Second, CYNTHIA [[id number]]   valACYclovir (VALTREX) 500 MG tablet    Sig: Take 1 tablet (500 mg total) by mouth 2 (two) times daily.    Dispense:  60 tablet    Refill:  5    Order Specific Question:   Supervising Provider    Answer:   Judyann Munson [[id number]]     Follow-up: Return in about 6 months (around 03/27/2022), or if symptoms worsen or fail to improve.   Marcos Eke, MSN, FNP-C Nurse Practitioner [facility] Main number: [phone]

## 2021-09-27 NOTE — Assessment & Plan Note (Signed)
[name] continues to have well-controlled virus with good adherence and tolerance to Tivicay, Prezcobix, and Descovy.  No signs/symptoms of opportunistic infection.  We reviewed previous lab work and discussed plan of care.  Continue current dose of Tivicay, Prezcobix, and Descovy.  Plan for follow-up in 6 months or sooner if needed with lab work on the same day.

## 2021-09-27 NOTE — Patient Instructions (Signed)
Nice to see you. ? ?We will check your lab work today. ? ?Continue to take your medication daily as prescribed. ? ?Refills have been sent to the pharmacy. ? ?Plan for follow up in 6 months or sooner if needed with lab work on the same day. ? ?Have a great day and stay safe! ? ?

## 2021-09-27 NOTE — Assessment & Plan Note (Signed)
Blood pressure adequately controlled below goal 140/90 with current dose of lisinopril.  No cough or other side effects.  Encouraged to continue monitoring blood pressures at home as able.  Continue current dose of lisinopril

## 2021-09-27 NOTE — Assessment & Plan Note (Signed)
   Discussed importance of safe sexual practice and condom usage.  Condoms offered.  Influenza vaccine updated today.

## 2021-09-28 LAB — T-HELPER CELL (CD4) - (RCID CLINIC ONLY)
CD4 % Helper T Cell: 15 % — ABNORMAL LOW (ref 33–65)
CD4 T Cell Abs: 304 /uL — ABNORMAL LOW (ref 400–1790)

## 2021-09-30 LAB — HIV-1 RNA QUANT-NO REFLEX-BLD
HIV 1 RNA Quant: 33 Copies/mL — ABNORMAL HIGH
HIV-1 RNA Quant, Log: 1.51 Log cps/mL — ABNORMAL HIGH

## 2021-10-27 ENCOUNTER — Other Ambulatory Visit: Payer: Self-pay | Admitting: Family

## 2021-10-27 DIAGNOSIS — B20 Human immunodeficiency virus [HIV] disease: Secondary | ICD-10-CM

## 2021-11-27 ENCOUNTER — Ambulatory Visit: Payer: Self-pay

## 2021-11-30 ENCOUNTER — Other Ambulatory Visit: Payer: Self-pay | Admitting: Family

## 2021-11-30 DIAGNOSIS — B20 Human immunodeficiency virus [HIV] disease: Secondary | ICD-10-CM

## 2021-11-30 DIAGNOSIS — I10 Essential (primary) hypertension: Secondary | ICD-10-CM

## 2021-12-06 ENCOUNTER — Ambulatory Visit: Payer: Self-pay

## 2021-12-06 ENCOUNTER — Other Ambulatory Visit: Payer: Self-pay

## 2022-02-21 ENCOUNTER — Encounter (HOSPITAL_COMMUNITY): Payer: Self-pay | Admitting: Emergency Medicine

## 2022-02-21 ENCOUNTER — Ambulatory Visit (HOSPITAL_COMMUNITY)
Admission: EM | Admit: 2022-02-21 | Discharge: 2022-02-21 | Disposition: A | Discharge status: Home / Self Care | Payer: Self-pay | Attending: Family Medicine | Admitting: Family Medicine

## 2022-02-21 DIAGNOSIS — H6122 Impacted cerumen, left ear: Secondary | ICD-10-CM

## 2022-02-21 NOTE — ED Provider Notes (Signed)
?[facility] ? ? ? ?CSN: [id number] ?Arrival date & time: 02/21/22  1855 ? ? ?  ? ?History   ?Chief Complaint ?Chief Complaint  ?Patient presents with  ? Ear Fullness  ? ? ?HPI ?[name] is a 56 y.o. adult.  ? ? ?Ear Fullness ? ?Here for his left ear feeling clogged.  He states that about every 5 years he has to have his left ear washed out.  No pain and no fever ? ?Past Medical History:  ?Diagnosis Date  ? Abnormal finding on GI tract imaging   ? Allergy   ? Candidiasis   ? Hepatitis C   ? Herpes iris   ? HIV disease ([place])   ? Hypertension   ? Hypokalemia   ? Plantar nerve lesion   ? Rectal bleeding   ? Retinitis   ? Syncope   ? Ulcer   ? Venereal wart   ? ? ?Patient Active Problem List  ? Diagnosis Date Noted  ? High triglycerides 12/14/2018  ? Health care maintenance 12/14/2018  ? Complicated grief 99991111  ? Depression 10/05/2015  ? Rash and nonspecific skin eruption 10/06/2013  ? Dental abscess 06/02/2012  ? 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 RETINITIS 07/11/2010  ? ALLERGIES-SEASONAL 05/07/2010  ? CANDIDIASIS, ORAL 08/21/2009  ? ABNORMAL FINDINGS GI TRACT 01/13/2009  ? HYPOKALEMIA 12/25/2008  ? RECTAL BLEEDING 12/25/2008  ? 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 Essential hypertension 03/23/2008  ? SYNCOPE 01/12/2007  ? Hepatitis C 09/27/2006  ? 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 HX, PERSONAL, PAST NONCOMPLIANCE 09/27/2006  ? Human immunodeficiency virus (HIV) disease ([place]) 02/06/2006  ? ? ?History reviewed. No pertinent surgical history. ? ? ? ? ?Home Medications   ? ?Prior to Admission medications   ?Medication Sig Start Date End Date Taking? Authorizing Provider  ?Acetaminophen (TYLENOL PO) Take by mouth.    [provider]  ?darunavir-cobicistat (PREZCOBIX) 800-150 MG tablet Swallow whole. Do NOT crush, break or chew tablets. Take with food. 09/27/21   Golden Circle, FNP  ?dolutegravir (TIVICAY) 50 MG tablet Take 1 tablet (50 mg total) by mouth daily.  09/27/21   Golden Circle, FNP  ?emtricitabine-tenofovir AF (DESCOVY) 200-25 MG tablet Take 1 tablet by mouth daily. 09/27/21   Golden Circle, FNP  ?ibuprofen (ADVIL) 600 MG tablet Take 1 tablet (600 mg total) by mouth every 6 (six) hours as needed. ?Patient not taking: Reported on 09/27/2021 04/15/20   Charlann Lange, PA-C  ?lisinopril (ZESTRIL) 40 MG tablet Take 1 tablet (40 mg total) by mouth daily. 09/27/21   Golden Circle, FNP  ?valACYclovir (VALTREX) 500 MG tablet Take 1 tablet (500 mg total) by mouth 2 (two) times daily. 09/27/21   Golden Circle, FNP  ?potassium chloride (K-DUR) 10 MEQ tablet Take 1 tablet (10 mEq total) by mouth once. Repeat labs at [facility] the next day ?Patient not taking: Reported on 01/31/2016 10/10/15 02/10/16  Campbell Riches, MD  ? ? ?Family History ?Family History  ?Problem Relation Age of Onset  ? Hypertension Sister   ? Hypertension Brother   ? Cancer Mother   ?     pancreatic cancer  ? ? ?Social History ?Social History  ? ?Tobacco Use  ? Smoking status: Never  ? Smokeless tobacco: Never  ?Substance Use Topics  ? Alcohol use: No  ? Drug use: No  ? ? ? ?Allergies   ?Apple juice ? ? ?Review of Systems ?  Review of Systems ? ? ?Physical Exam ?Triage Vital Signs ?ED Triage Vitals [02/21/22 1909]  ?Enc Vitals Group  ?   BP (!) 145/95  ?   Pulse Rate 83  ?   Resp 18  ?   Temp 98.7 ?F (37.1 ?C)  ?   Temp src   ?   SpO2 95 %  ?   Weight   ?   Height   ?   Head Circumference   ?   Peak Flow   ?   Pain Score 0  ?   Pain Loc   ?   Pain Edu?   ?   Excl. in [place]?   ? ?No data found. ? ?Updated Vital Signs ?BP (!) 145/95   Pulse 83   Temp 98.7 ?F (37.1 ?C)   Resp 18   SpO2 95%  ? ?Visual Acuity ?Right Eye Distance:   ?Left Eye Distance:   ?Bilateral Distance:   ? ?Right Eye Near:   ?Left Eye Near:    ?Bilateral Near:    ? ?Physical Exam ?Vitals reviewed.  ?Constitutional:   ?   General: He is not in acute distress. ?HENT:  ?   Right Ear: Tympanic membrane normal.  ?   Ears:  ?    Comments: Left tympanic membrane is mostly obscured by soft brown cerumen ?Neurological:  ?   Mental Status: He is alert.  ? ? ? ?UC Treatments / Results  ?Labs ?(all labs ordered are listed, but only abnormal results are displayed) ?Labs Reviewed - No data to display ? ?EKG ? ? ?Radiology ?No results found. ? ?Procedures ?Procedures (including critical care time) ? ?Medications Ordered in UC ?Medications - No data to display ? ?Initial Impression / Assessment and Plan / UC Course  ?I have reviewed the triage vital signs and the nursing notes. ? ?Pertinent labs & imaging results that were available during my care of the patient were reviewed by me and considered in my medical decision making (see chart for details). ? ?  ? ?Staff will lavage his left ear ?Final Clinical Impressions(s) / UC Diagnoses  ? ?Final diagnoses:  ?Impacted cerumen of left ear  ? ? ? ?Discharge Instructions   ? ?  ?Staff washed out your left ear today ? ? ? ? ?ED Prescriptions   ?None ?  ? ?PDMP not reviewed this encounter. ?  ?Barrett Henle, MD ?02/21/22 1933 ? ?

## 2022-02-21 NOTE — ED Triage Notes (Signed)
Pt is present today with left ear fullness. Pt states he noticed the fullness two weeks ago  ?

## 2022-02-21 NOTE — Discharge Instructions (Addendum)
Staff washed out your left ear today ?

## 2022-04-21 ENCOUNTER — Encounter: Payer: Self-pay | Admitting: Infectious Diseases

## 2022-04-21 ENCOUNTER — Other Ambulatory Visit: Payer: Self-pay | Admitting: Family

## 2022-04-21 DIAGNOSIS — B009 Herpesviral infection, unspecified: Secondary | ICD-10-CM

## 2022-04-21 DIAGNOSIS — B20 Human immunodeficiency virus [HIV] disease: Secondary | ICD-10-CM

## 2022-05-18 ENCOUNTER — Other Ambulatory Visit: Payer: Self-pay | Admitting: Family

## 2022-05-18 DIAGNOSIS — B20 Human immunodeficiency virus [HIV] disease: Secondary | ICD-10-CM

## 2022-05-22 ENCOUNTER — Other Ambulatory Visit: Payer: Self-pay | Admitting: Family

## 2022-05-22 DIAGNOSIS — B009 Herpesviral infection, unspecified: Secondary | ICD-10-CM

## 2022-05-22 DIAGNOSIS — B20 Human immunodeficiency virus [HIV] disease: Secondary | ICD-10-CM

## 2022-05-22 NOTE — Telephone Encounter (Signed)
Okay to refill? 

## 2022-05-30 ENCOUNTER — Ambulatory Visit: Payer: Self-pay

## 2022-05-30 ENCOUNTER — Encounter: Payer: Self-pay | Admitting: Family

## 2022-05-30 ENCOUNTER — Ambulatory Visit: Payer: Self-pay | Admitting: Family

## 2022-05-30 ENCOUNTER — Other Ambulatory Visit: Payer: Self-pay

## 2022-05-30 VITALS — BP 121/75 | HR 81 | Temp 98.3°F | Wt 228.0 lb

## 2022-05-30 DIAGNOSIS — Z0000 Encounter for general adult medical examination without abnormal findings: Secondary | ICD-10-CM

## 2022-05-30 DIAGNOSIS — Z113 Encounter for screening for infections with a predominantly sexual mode of transmission: Secondary | ICD-10-CM

## 2022-05-30 DIAGNOSIS — B20 Human immunodeficiency virus [HIV] disease: Secondary | ICD-10-CM

## 2022-05-30 DIAGNOSIS — Z79899 Other long term (current) drug therapy: Secondary | ICD-10-CM

## 2022-05-30 DIAGNOSIS — I10 Essential (primary) hypertension: Secondary | ICD-10-CM

## 2022-05-30 MED ORDER — DESCOVY 200-25 MG PO TABS: 1.0000 | ORAL_TABLET | Freq: Every day | ORAL | 11 refills | Status: DC

## 2022-05-30 MED ORDER — TIVICAY 50 MG PO TABS: ORAL_TABLET | ORAL | 11 refills | Status: DC

## 2022-05-30 MED ORDER — PREZCOBIX 800-150 MG PO TABS: ORAL_TABLET | ORAL | 11 refills | Status: DC

## 2022-05-30 MED ORDER — LISINOPRIL 40 MG PO TABS: 40.0000 mg | ORAL_TABLET | Freq: Every day | ORAL | 6 refills | Status: DC

## 2022-05-30 NOTE — Assessment & Plan Note (Signed)
Importantly continues to have well-controlled virus with good adherence and tolerance to Prezcobix, Descovy, and Tivicay.  Reviewed previous lab work and discussed plan of care.  Check blood work today.  Financial assistance renewed.  Continue current dose of Prezcobix, Tivicay, and Descovy.  Plan for follow-up in 6 months or sooner if needed with lab work on the same day.

## 2022-05-30 NOTE — Assessment & Plan Note (Signed)
Poorly and has well-controlled blood pressure with current dose of lisinopril.  Denies cough and there are no neurological or ophthalmologic symptoms.  Encouraged to monitor blood pressure at home as able.  Continue current dose of lisinopril.

## 2022-05-30 NOTE — Patient Instructions (Addendum)
Nice to see you. ? ?We will check your lab work today. ? ?Continue to take your medication daily as prescribed. ? ?Refills have been sent to the pharmacy. ? ?Plan for follow up in 6 months or sooner if needed with lab work on the same day. ? ?Have a great day and stay safe! ? ?

## 2022-05-30 NOTE — Progress Notes (Signed)
Patient ID: [name], adult    DOB: [birth year], 56 y.o.   MRN: [id number]  Subjective:    Chief Complaint  Patient presents with   Follow-up   HIV Positive/AIDS    HPI:  [name] is a 56 y.o. adult with HIV disease last seen on 09/27/21 with well controlled virus and good adherence and tolerance to Tivicay, Descovy, and Prezcobix. Viral load was undetectable and CD4 count 304. Here today for routine follow up.   [name] continues to take Tivicay, Prezcobix and Descovy daily as prescribed with no adverse side effects. Feeling well today with no new concerns/complaints. Denies fevers, chills, night sweats, headaches, changes in vision, neck pain/stiffness, nausea, diarrhea, vomiting, lesions or rashes.  [name] has no problems obtaining medication from the pharmacy and renewed financial assistance.  Denies any doing down, depressed, or hopeless recently.  No current recreational illicit drug use, alcohol consumption, or tobacco use.  Condoms offered.  Due for routine dental care and colon cancer screening.   Allergies  Allergen Reactions   Apple Juice Swelling    Apple peelings      Outpatient Medications Prior to Visit  Medication Sig Dispense Refill   Acetaminophen (TYLENOL PO) Take by mouth.     valACYclovir (VALTREX) 500 MG tablet TAKE 1 TABLET BY MOUTH TWICE DAILY 60 tablet 0   darunavir-cobicistat (PREZCOBIX) 800-150 MG tablet TAKE 1 TABLET BY MOUTH EVERY DAY WITH FOOD. SWALLOW WHOLE DO NOT CRUSH BREAK OR CHEW 30 tablet 0   DESCOVY 200-25 MG tablet TAKE 1 TABLET BY MOUTH DAILY 30 tablet 0   lisinopril (ZESTRIL) 40 MG tablet Take 1 tablet (40 mg total) by mouth daily. 30 tablet 6   TIVICAY 50 MG tablet TAKE 1 TABLET(50 MG) BY MOUTH DAILY 30 tablet 0   ibuprofen (ADVIL) 600 MG tablet Take 1 tablet (600 mg total) by mouth every 6 (six) hours as needed. (Patient not taking: Reported on 05/30/2022) 30 tablet 0   No facility-administered medications prior to  visit.     Past Medical History:  Diagnosis Date   Abnormal finding on GI tract imaging    Allergy    Candidiasis    Hepatitis C    Herpes iris    HIV disease ([facility])    Hypertension    Hypokalemia    Plantar nerve lesion    Rectal bleeding    Retinitis    Syncope    Ulcer    Venereal wart      History reviewed. No pertinent surgical history.    Review of Systems  Constitutional:  Negative for appetite change, chills, diaphoresis, fatigue, fever and unexpected weight change.  Eyes:        Negative for acute change in vision  Respiratory:  Negative for chest tightness, shortness of breath and wheezing.   Cardiovascular:  Negative for chest pain.  Gastrointestinal:  Negative for diarrhea, nausea and vomiting.  Genitourinary:  Negative for dysuria, pelvic pain and vaginal discharge.  Musculoskeletal:  Negative for neck pain and neck stiffness.  Skin:  Negative for rash.  Neurological:  Negative for seizures, syncope, weakness and headaches.  Hematological:  Negative for adenopathy. Does not bruise/bleed easily.  Psychiatric/Behavioral:  Negative for hallucinations.       Objective:    BP 121/75   Pulse 81   Temp 98.3 F (36.8 C) (Oral)   Wt 228 lb (103.4 kg)   SpO2 95%   BMI 30.92 kg/m  Nursing note and  vital signs reviewed.  Physical Exam Constitutional:      General: He is not in acute distress.    Appearance: He is well-developed.  Eyes:     Conjunctiva/sclera: Conjunctivae normal.  Cardiovascular:     Rate and Rhythm: Normal rate and regular rhythm.     Heart sounds: Normal heart sounds. No murmur heard.    No friction rub. No gallop.  Pulmonary:     Effort: Pulmonary effort is normal. No respiratory distress.     Breath sounds: Normal breath sounds. No wheezing or rales.  Chest:     Chest wall: No tenderness.  Abdominal:     General: Bowel sounds are normal.     Palpations: Abdomen is soft.     Tenderness: There is no abdominal tenderness.   Musculoskeletal:     Cervical back: Neck supple.  Lymphadenopathy:     Cervical: No cervical adenopathy.  Skin:    General: Skin is warm and dry.     Findings: No rash.  Neurological:     Mental Status: He is alert and oriented to person, place, and time.  Psychiatric:        Behavior: Behavior normal.        Thought Content: Thought content normal.        Judgment: Judgment normal.         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   RETINITIS 07/11/2010   ALLERGIES-SEASONAL 05/07/2010   CANDIDIASIS, ORAL 08/21/2009   ABNORMAL FINDINGS GI TRACT 01/13/2009   HYPOKALEMIA 12/25/2008   RECTAL BLEEDING 12/25/2008   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  Denies cough and there are no neurological or ophthalmologic symptoms.  Encouraged to monitor blood pressure at home as able.  Continue current dose of lisinopril.      Relevant Medications   lisinopril (ZESTRIL) 40 MG tablet     Other   Human immunodeficiency virus (HIV) disease ([facility]) -  Primary    Importantly continues to have well-controlled virus with good adherence and tolerance to Prezcobix, Descovy, and Tivicay.  Reviewed previous lab work and discussed plan of care.  Check blood work today.  Financial assistance renewed.  Continue current dose of Prezcobix, Tivicay, and Descovy.  Plan for follow-up in 6 months or sooner if needed with lab work on the same day.      Relevant Medications  darunavir-cobicistat (PREZCOBIX) 800-150 MG tablet   emtricitabine-tenofovir AF (DESCOVY) 200-25 MG tablet   dolutegravir (TIVICAY) 50 MG tablet   Other Relevant Orders   CBC with Differential/Platelet   COMPLETE METABOLIC PANEL WITH GFR   HIV-1 RNA quant-no reflex-bld   Lipid panel   T-helper cell (CD4)- ([facility] clinic only)   RPR   Urine cytology ancillary only   Health care maintenance    Discussed importance of safe sexual practices and condom use.  Condoms offered. Due for routine dental care and will refer to [facility] dental clinic.  Due for colon cancer screening and encouraged to obtain colonoscopy.       Other Visit Diagnoses     Screening examination for venereal disease       Relevant Orders   CBC with Differential/Platelet   COMPLETE METABOLIC PANEL WITH GFR   HIV-1 RNA quant-no reflex-bld   Lipid panel   T-helper cell (CD4)- ([facility] clinic only)   RPR   Urine cytology ancillary only   Encounter for long-term (current) use of medications       Relevant Orders   CBC with Differential/Platelet   COMPLETE METABOLIC PANEL WITH GFR   HIV-1 RNA quant-no reflex-bld   Lipid panel   T-helper cell (CD4)- ([facility] clinic only)   RPR   Urine cytology ancillary only        I have changed Jerimy T. Glauber's Descovy and Tivicay. I am also having him maintain his ibuprofen, Acetaminophen (TYLENOL PO), valACYclovir, Prezcobix, and lisinopril.   Meds ordered this encounter  Medications   darunavir-cobicistat (PREZCOBIX) 800-150 MG tablet    Sig: TAKE 1 TABLET BY MOUTH  EVERY DAY WITH FOOD. SWALLOW WHOLE DO NOT CRUSH BREAK OR CHEW    Dispense:  30 tablet    Refill:  11    Order Specific Question:   Supervising Provider    Answer:   Drue Second, CYNTHIA [[id number]]   emtricitabine-tenofovir AF (DESCOVY) 200-25 MG tablet    Sig: Take 1 tablet by mouth daily.    Dispense:  30 tablet    Refill:  11    Order Specific Question:   Supervising Provider    Answer:   Drue Second, CYNTHIA [[id number]]   dolutegravir (TIVICAY) 50 MG tablet    Sig: TAKE 1 TABLET(50 MG) BY MOUTH DAILY    Dispense:  30 tablet    Refill:  11    Order Specific Question:   Supervising Provider    Answer:   Drue Second, CYNTHIA [[id number]]   lisinopril (ZESTRIL) 40 MG tablet    Sig: Take 1 tablet (40 mg total) by mouth daily.    Dispense:  30 tablet    Refill:  6    Order Specific Question:   Supervising Provider    Answer:   Judyann Munson [[id number]]     Follow-up: Return in about 6 months (around 11/30/2022).   Marcos Eke, MSN, FNP-C Nurse Practitioner [facility] Main number: [phone]

## 2022-05-30 NOTE — Assessment & Plan Note (Signed)
   Discussed importance of safe sexual practices and condom use.  Condoms offered.  Due for routine dental care and will refer to [facility] dental clinic.   Due for colon cancer screening and encouraged to obtain colonoscopy.

## 2022-05-31 LAB — URINE CYTOLOGY ANCILLARY ONLY
Chlamydia: NEGATIVE
Comment: NEGATIVE
Comment: NORMAL
Neisseria Gonorrhea: NEGATIVE

## 2022-05-31 LAB — T-HELPER CELL (CD4) - (RCID CLINIC ONLY)
CD4 % Helper T Cell: 18 % — ABNORMAL LOW (ref 33–65)
CD4 T Cell Abs: 249 /uL — ABNORMAL LOW (ref 400–1790)

## 2022-06-03 LAB — COMPLETE METABOLIC PANEL WITH GFR
AG Ratio: 1.7 (calc) (ref 1.0–2.5)
ALT: 14 U/L (ref 9–46)
AST: 13 U/L (ref 10–35)
Albumin: 4.5 g/dL (ref 3.6–5.1)
Alkaline phosphatase (APISO): 68 U/L (ref 35–144)
BUN: 10 mg/dL (ref 7–25)
CO2: 23 mmol/L (ref 20–32)
Calcium: 9.7 mg/dL (ref 8.6–10.3)
Chloride: 108 mmol/L (ref 98–110)
Creat: 0.93 mg/dL (ref 0.70–1.30)
Globulin: 2.7 g/dL (calc) (ref 1.9–3.7)
Glucose, Bld: 93 mg/dL (ref 65–99)
Potassium: 3.8 mmol/L (ref 3.5–5.3)
Sodium: 139 mmol/L (ref 135–146)
Total Bilirubin: 0.6 mg/dL (ref 0.2–1.2)
Total Protein: 7.2 g/dL (ref 6.1–8.1)
eGFR: 96 mL/min/{1.73_m2} (ref >=60)

## 2022-06-03 LAB — LIPID PANEL
Cholesterol: 166 mg/dL (ref <200)
HDL: 49 mg/dL (ref >=40)
LDL Cholesterol (Calc): 99 mg/dL (calc)
Non-HDL Cholesterol (Calc): 117 mg/dL (calc) (ref <130)
Total CHOL/HDL Ratio: 3.4 (calc) (ref <5.0)
Triglycerides: 87 mg/dL (ref <150)

## 2022-06-03 LAB — CBC WITH DIFFERENTIAL/PLATELET
Absolute Monocytes: 407 cells/uL (ref 200–950)
Basophils Absolute: 11 cells/uL (ref 0–200)
Basophils Relative: 0.3 %
Eosinophils Absolute: 170 cells/uL (ref 15–500)
Eosinophils Relative: 4.6 %
HCT: 40.3 % (ref 38.5–50.0)
Hemoglobin: 14.4 g/dL (ref 13.2–17.1)
Lymphs Abs: 1443 cells/uL (ref 850–3900)
MCH: 34.9 pg — ABNORMAL HIGH (ref 27.0–33.0)
MCHC: 35.7 g/dL (ref 32.0–36.0)
MCV: 97.6 fL (ref 80.0–100.0)
MPV: 9.3 fL (ref 7.5–12.5)
Monocytes Relative: 11 %
Neutro Abs: 1669 cells/uL (ref 1500–7800)
Neutrophils Relative %: 45.1 %
Platelets: 232 10*3/uL (ref 140–400)
RBC: 4.13 10*6/uL — ABNORMAL LOW (ref 4.20–5.80)
RDW: 12.6 % (ref 11.0–15.0)
Total Lymphocyte: 39 %
WBC: 3.7 10*3/uL — ABNORMAL LOW (ref 3.8–10.8)

## 2022-06-03 LAB — HIV-1 RNA QUANT-NO REFLEX-BLD
HIV 1 RNA Quant: <20 Copies/mL — ABNORMAL HIGH
HIV-1 RNA Quant, Log: <1.3 Log cps/mL — ABNORMAL HIGH

## 2022-06-03 LAB — RPR: RPR Ser Ql: NONREACTIVE

## 2022-06-05 ENCOUNTER — Telehealth: Payer: Self-pay

## 2022-06-05 NOTE — Telephone Encounter (Signed)
-----   Message from Veryl Speak, FNP sent at 06/05/2022  1:37 PM EDT ----- Please inform [name] that lab work looks good with undetectable viral load and CD4 count 249. Kidney function, liver function, and electrolytes are normal. STD testing was negative.

## 2022-06-05 NOTE — Telephone Encounter (Signed)
Spoke with [name], relayed that his viral load was undetectable and CD4 count was 249. Relayed that kidney function, liver function, and electrolytes were normal and STD testing negative. He verbalized understanding and has no further questions.   Sandie Ano, RN

## 2022-06-19 ENCOUNTER — Other Ambulatory Visit: Payer: Self-pay | Admitting: Family

## 2022-06-19 DIAGNOSIS — B20 Human immunodeficiency virus [HIV] disease: Secondary | ICD-10-CM

## 2022-06-22 ENCOUNTER — Other Ambulatory Visit: Payer: Self-pay | Admitting: Family

## 2022-06-22 DIAGNOSIS — B20 Human immunodeficiency virus [HIV] disease: Secondary | ICD-10-CM

## 2022-06-22 DIAGNOSIS — B009 Herpesviral infection, unspecified: Secondary | ICD-10-CM

## 2022-10-26 ENCOUNTER — Other Ambulatory Visit: Payer: Self-pay | Admitting: Family

## 2022-10-26 DIAGNOSIS — B009 Herpesviral infection, unspecified: Secondary | ICD-10-CM

## 2022-10-28 NOTE — Telephone Encounter (Signed)
Ok to refill 

## 2022-12-09 ENCOUNTER — Ambulatory Visit (INDEPENDENT_AMBULATORY_CARE_PROVIDER_SITE_OTHER): Payer: Self-pay | Admitting: Family

## 2022-12-09 ENCOUNTER — Other Ambulatory Visit: Payer: Self-pay

## 2022-12-09 ENCOUNTER — Other Ambulatory Visit (HOSPITAL_COMMUNITY): Payer: Self-pay

## 2022-12-09 ENCOUNTER — Ambulatory Visit: Payer: Self-pay

## 2022-12-09 ENCOUNTER — Encounter: Payer: Self-pay | Admitting: Family

## 2022-12-09 VITALS — BP 148/93 | HR 85 | Resp 16 | Ht 72.0 in | Wt 228.0 lb

## 2022-12-09 DIAGNOSIS — B20 Human immunodeficiency virus [HIV] disease: Secondary | ICD-10-CM

## 2022-12-09 DIAGNOSIS — Z23 Encounter for immunization: Secondary | ICD-10-CM

## 2022-12-09 DIAGNOSIS — Z0000 Encounter for general adult medical examination without abnormal findings: Secondary | ICD-10-CM

## 2022-12-09 MED ORDER — DESCOVY 200-25 MG PO TABS: 1.0000 | ORAL_TABLET | Freq: Every day | ORAL | 11 refills | Status: DC

## 2022-12-09 MED ORDER — TIVICAY 50 MG PO TABS: ORAL_TABLET | ORAL | 11 refills | Status: DC

## 2022-12-09 NOTE — Assessment & Plan Note (Signed)
Discussed importance of safe sexual practice and condom use. Condoms and STD testing offered.  Influenza updated today.

## 2022-12-09 NOTE — Patient Instructions (Signed)
Nice to see you.  We will check your lab work today.  Continue to take your medication daily as prescribed.  Refills have been sent to the pharmacy.  Schedule lab work for 1 month  Plan for follow up in 6 months or sooner if needed with lab work on the same day.  Have a great day and stay safe!

## 2022-12-09 NOTE — Assessment & Plan Note (Signed)
Shannen continues to have well controlled virus with good adherence and tolerance to Prezcobix, Tivicay and Descovy. Reviewed previous lab work and plan of care. [place]osures reviewed and it appears to that Tivicay and Descovy should be suitable with known resistance patterns. Will remove Prezcobix for now and retest in 1 month with possibility of adding back Prezcobix or changing to [place]. Plan for follow up in 1 month for lab work and 6 months for office visit.

## 2022-12-09 NOTE — Progress Notes (Signed)
Brief Narrative   Patient ID: [name], male    DOB: 16-Jan-1966, 57 y.o.   MRN: [id number]  [name] is a 57 y/o male diagnosed with HIV disease in March 2007 with risk factor of heterosexual contact. Initial viral load was 546,000 with CD4 count 10. Genosure with M18V, K103N, G190A, L210W, K70E, 155H/N. Entered care at [facility] Stage 3. ART experienced with Triumeq, Atazanavir/ritonivir, Truvada, Raltegravir, Tivicay, Descovy, and Prezcobix.   Subjective:    Chief Complaint  Patient presents with   Follow-up    HPI:  [name] is a 57 y.o. male with HIV disease last seen on 05/30/22 with well controlled virus with good adherence and tolerance to Tivicay, Prezcobix, and Descovy. Viral load was undetectable with CD4 count 249. Renal function, electrolytes, and hepatic function within normal ranges. Here today for follow up.   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 Taking Tivicay, Descovy and Prezcobix with no adverse side effects or problems obtaining medication from the pharmacy. No new concerns/complaints. Condoms and STD testing offered. Financial assistance up to date. Due for influenza.   Denies fevers, chills, night sweats, headaches, changes in vision, neck pain/stiffness, nausea, diarrhea, vomiting, lesions or rashes.   Allergies  Allergen Reactions   Apple Juice Swelling    Apple peelings      Outpatient Medications Prior to Visit  Medication Sig Dispense Refill   Acetaminophen (TYLENOL PO) Take by mouth.     lisinopril (ZESTRIL) 40 MG tablet Take 1 tablet (40 mg total) by mouth daily. 30 tablet 6   valACYclovir (VALTREX) 500 MG tablet TAKE 1 TABLET(500 MG) BY MOUTH TWICE DAILY 60 tablet 2   darunavir-cobicistat (PREZCOBIX) 800-150 MG tablet TAKE 1 TABLET BY MOUTH EVERY DAY WITH FOOD. SWALLOW WHOLE DO NOT CRUSH BREAK OR CHEW 30 tablet 11   dolutegravir (TIVICAY) 50 MG tablet TAKE 1  TABLET(50 MG) BY MOUTH DAILY 30 tablet 11   emtricitabine-tenofovir AF (DESCOVY) 200-25 MG tablet Take 1 tablet by mouth daily. 30 tablet 11   ibuprofen (ADVIL) 600 MG tablet Take 1 tablet (600 mg total) by mouth every 6 (six) hours as needed. (Patient not taking: Reported on 05/30/2022) 30 tablet 0   No facility-administered medications prior to visit.     Past Medical History:  Diagnosis Date   Abnormal finding on GI tract imaging    Allergy    Candidiasis    Hepatitis C    Herpes iris    HIV disease (HCC)    Hypertension    Hypokalemia    Plantar nerve lesion    Rectal bleeding    Retinitis    Syncope    Ulcer    Venereal wart      History reviewed. No pertinent surgical history.    Review of Systems  Constitutional:  Negative for appetite change, chills, fatigue, fever and unexpected weight change.  Eyes:  Negative for visual disturbance.  Respiratory:  Negative for cough, chest tightness, shortness of breath and wheezing.   Cardiovascular:  Negative for chest pain and leg swelling.  Gastrointestinal:  Negative for abdominal pain, constipation, diarrhea, nausea and vomiting.  Genitourinary:  Negative for dysuria, flank pain, frequency, genital sores, hematuria and urgency.  Skin:  Negative for rash.  Allergic/Immunologic: Negative for immunocompromised state.  Neurological:  Negative for dizziness and headaches.      Objective:    BP (!) 148/93  Pulse 85   Resp 16   Ht 6' (1.829 m)   Wt 228 lb (103.4 kg)   SpO2 99%   BMI 30.92 kg/m  Nursing note and vital signs reviewed.  Physical Exam Constitutional:      General: He is not in acute distress.    Appearance: He is well-developed.  Eyes:     Conjunctiva/sclera: Conjunctivae normal.  Cardiovascular:     Rate and Rhythm: Normal rate and regular rhythm.     Heart sounds: Normal heart sounds. No murmur heard.    No friction rub. No gallop.  Pulmonary:     Effort: Pulmonary effort is normal. No  respiratory distress.     Breath sounds: Normal breath sounds. No wheezing or rales.  Chest:     Chest wall: No tenderness.  Abdominal:     General: Bowel sounds are normal.     Palpations: Abdomen is soft.     Tenderness: There is no abdominal tenderness.  Musculoskeletal:     Cervical back: Neck supple.  Lymphadenopathy:     Cervical: No cervical adenopathy.  Skin:    General: Skin is warm and dry.     Findings: No rash.  Neurological:     Mental Status: He is alert and oriented to person, place, and time.  Psychiatric:        Behavior: Behavior normal.        Thought Content: Thought content normal.        Judgment: Judgment normal.         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) 02/06/2006     Problem List Items Addressed This Visit       Other   Human immunodeficiency virus (HIV) disease ([place])    [name] continues to have well controlled virus with good adherence and tolerance to Prezcobix, Tivicay and Descovy.  Reviewed previous lab work and plan of care. Genosures reviewed and it appears to that Tivicay and Descovy should be suitable with known resistance patterns. Will remove Prezcobix for now and retest in 1 month with possibility of adding back Prezcobix or changing to [place]ter. Plan for follow up in 1 month for lab work and 6 months for office visit.       Relevant Medications   dolutegravir (TIVICAY) 50 MG tablet   emtricitabine-tenofovir AF (DESCOVY) 200-25 MG tablet   Other Relevant Orders  BASIC METABOLIC PANEL WITH GFR   HIV-1 RNA quant-no reflex-bld   T-helper cell (CD4)- ([facility] clinic only)   Health care maintenance - Primary    Discussed importance of safe sexual practice and condom use. Condoms and STD testing offered.  Influenza updated today.       Other Visit Diagnoses     Need for immunization against influenza       Relevant Orders   Flu Vaccine QUAD 6mo+IM (Fluarix, Fluzone & Alfiuria Quad PF) (Completed)        I have discontinued [name]'s Prezcobix. I am also having him maintain his ibuprofen, Acetaminophen (TYLENOL PO), lisinopril, valACYclovir, Tivicay, and Descovy.   Meds ordered this encounter  Medications   dolutegravir (TIVICAY) 50 MG tablet    Sig: TAKE 1 TABLET(50 MG) BY MOUTH DAILY    Dispense:  30 tablet    Refill:  11    Discontinue Prezcobix and continue Tivicay and Descovy.    Order Specific Question:   Supervising Provider    Answer:   Judyann Munson [[id number]]   emtricitabine-tenofovir AF (DESCOVY) 200-25 MG tablet    Sig: Take 1 tablet by mouth daily.    Dispense:  30 tablet    Refill:  11    Order Specific Question:   Supervising Provider    Answer:   Judyann Munson [[id number]]     Follow-up: Return in about 6 months (around 06/09/2023).   Marcos Eke, MSN, FNP-C Nurse Practitioner [facility] Main number: [phone]

## 2022-12-24 ENCOUNTER — Other Ambulatory Visit: Payer: Self-pay | Admitting: Family

## 2022-12-24 DIAGNOSIS — B20 Human immunodeficiency virus [HIV] disease: Secondary | ICD-10-CM

## 2023-02-02 ENCOUNTER — Other Ambulatory Visit: Payer: Self-pay | Admitting: Family

## 2023-02-02 DIAGNOSIS — I10 Essential (primary) hypertension: Secondary | ICD-10-CM

## 2023-02-02 DIAGNOSIS — B009 Herpesviral infection, unspecified: Secondary | ICD-10-CM

## 2023-02-03 NOTE — Telephone Encounter (Signed)
Okay to refill per Greg Calone, NP.   [place] D Shaqueta Casady, RN  

## 2023-02-03 NOTE — Telephone Encounter (Signed)
Please advise on refill request

## 2023-04-28 NOTE — Progress Notes (Signed)
The 10-year ASCVD risk score (Arnett DK, et al., 2019) is: 14.7%   Values used to calculate the score:     Age: 57 years     Sex: Male     Is Non-Hispanic African American: Yes     Diabetic: No     Tobacco smoker: No     Systolic Blood Pressure: 148 mmHg     Is BP treated: Yes     HDL Cholesterol: 49 mg/dL     Total Cholesterol: 166 mg/dL  Sandie Ano, RN

## 2023-04-29 ENCOUNTER — Other Ambulatory Visit: Payer: Self-pay

## 2023-04-29 ENCOUNTER — Encounter: Payer: Self-pay | Admitting: Family

## 2023-04-29 ENCOUNTER — Ambulatory Visit (INDEPENDENT_AMBULATORY_CARE_PROVIDER_SITE_OTHER): Payer: Self-pay | Admitting: Family

## 2023-04-29 VITALS — BP 143/89 | HR 74 | Temp 98.0°F | Ht 72.0 in | Wt 228.0 lb

## 2023-04-29 DIAGNOSIS — Z79899 Other long term (current) drug therapy: Secondary | ICD-10-CM

## 2023-04-29 DIAGNOSIS — B20 Human immunodeficiency virus [HIV] disease: Secondary | ICD-10-CM

## 2023-04-29 DIAGNOSIS — Z0000 Encounter for general adult medical examination without abnormal findings: Secondary | ICD-10-CM

## 2023-04-29 DIAGNOSIS — I10 Essential (primary) hypertension: Secondary | ICD-10-CM

## 2023-04-29 MED ORDER — DESCOVY 200-25 MG PO TABS: 1.0000 | ORAL_TABLET | Freq: Every day | ORAL | 5 refills | Status: DC

## 2023-04-29 MED ORDER — LISINOPRIL 40 MG PO TABS: ORAL_TABLET | ORAL | 6 refills | Status: DC

## 2023-04-29 MED ORDER — TIVICAY 50 MG PO TABS
ORAL_TABLET | ORAL | 5 refills | Status: DC
Start: 2023-04-29 — End: 2023-12-17

## 2023-04-29 MED ORDER — PREZCOBIX 800-150 MG PO TABS: 1.0000 | ORAL_TABLET | Freq: Every day | ORAL | 5 refills | Status: DC

## 2023-04-29 MED ORDER — TIVICAY 50 MG PO TABS: ORAL_TABLET | ORAL | 5 refills | Status: DC

## 2023-04-29 NOTE — Patient Instructions (Addendum)
Nice to see you. ? ?We will check your lab work today. ? ?Continue to take your medication daily as prescribed. ? ?Refills have been sent to the pharmacy. ? ?Plan for follow up in 6 months or sooner if needed with lab work on the same day. ? ?Have a great day and stay safe! ? ?

## 2023-04-29 NOTE — Assessment & Plan Note (Signed)
Discussed importance of safe sexual practice and condom use. Condoms and STD testing offered.  Due for colonoscopy and will await Medicaid coverage otherwise will need [place] financial assistance. Dental care up to date.

## 2023-04-29 NOTE — Progress Notes (Signed)
Brief Narrative   Patient ID: [name], male    DOB: [birth year], 57 y.o.   MRN: [id number]  Mr. [name] is a 57 y/o male diagnosed with HIV disease in March 2007 with risk factor of heterosexual contact. Initial viral load was 546,000 with CD4 count 10. Genosure with M18V, K103N, G190A, L210W, K70E, 155H/N. Entered care at [facility] Stage 3. ART experienced with Triumeq, Atazanavir/ritonivir, Truvada, Raltegravir, Tivicay, Descovy, and Prezcobix.   Subjective:    Chief Complaint  Patient presents with   Follow-up   HIV Positive/AIDS    HPI:  [name] is a 57 y.o. male with HIV disease last seen on 12/09/22 with well controlled virus and good adherence and tolerance to Tivicay, Descovy, and Prezcobix. Lab work was deferred at that time but was previously undetectable. Here today for follow up.  Mr. [name] has been doing well since his last visit and continues to take medication as prescribed with no adverse side effects. Currently covered by [employer] and will need to apply for Medicaid. Feeling well with no new concerns/complaints. Condoms and STD testing offered.   Denies fevers, chills, night sweats, headaches, changes in vision, neck pain/stiffness, nausea, diarrhea, vomiting, lesions or rashes.    Allergies  Allergen Reactions   Apple Juice Swelling    Apple peelings      Outpatient Medications Prior to Visit  Medication Sig Dispense Refill   Acetaminophen (TYLENOL PO) Take by mouth.     valACYclovir (VALTREX) 500 MG tablet TAKE 1 TABLET(500 MG) BY MOUTH TWICE DAILY 60 tablet 2   dolutegravir (TIVICAY) 50 MG tablet TAKE 1 TABLET(50 MG) BY MOUTH DAILY 30 tablet 11   emtricitabine-tenofovir AF (DESCOVY) 200-25 MG tablet Take 1 tablet by mouth daily. 30 tablet 11   lisinopril (ZESTRIL) 40 MG tablet TAKE 1 TABLET(40 MG) BY MOUTH DAILY 30 tablet 6   ibuprofen (ADVIL) 600 MG tablet Take 1 tablet (600 mg total) by mouth every 6 (six) hours as needed. (Patient not taking:  Reported on 05/30/2022) 30 tablet 0   No facility-administered medications prior to visit.     Past Medical History:  Diagnosis Date   Abnormal finding on GI tract imaging    Allergy    Candidiasis    Hepatitis C    Herpes iris    HIV disease (HCC)    Hypertension    Hypokalemia    Plantar nerve lesion    Rectal bleeding    Retinitis    Syncope    Ulcer    Venereal wart      History reviewed. No pertinent surgical history.    Review of Systems  Constitutional:  Negative for appetite change, chills, fatigue, fever and unexpected weight change.  Eyes:  Negative for visual disturbance.  Respiratory:  Negative for cough, chest tightness, shortness of breath and wheezing.   Cardiovascular:  Negative for chest pain and leg swelling.  Gastrointestinal:  Negative for abdominal pain, constipation, diarrhea, nausea and vomiting.  Genitourinary:  Negative for dysuria, flank pain, frequency, genital sores, hematuria and urgency.  Skin:  Negative for rash.  Allergic/Immunologic: Negative for immunocompromised state.  Neurological:  Negative for dizziness and headaches.      Objective:    BP (!) 143/89   Pulse 74   Temp 98 F (36.7 C) (Oral)   Ht 6' (1.829 m)   Wt 228 lb (103.4 kg)   SpO2 96%   BMI 30.92 kg/m  Nursing note and vital signs reviewed.  Physical Exam Constitutional:      General: He is not in acute distress.    Appearance: He is well-developed.  Eyes:     Conjunctiva/sclera: Conjunctivae normal.  Cardiovascular:     Rate and Rhythm: Normal rate and regular rhythm.     Heart sounds: Normal heart sounds. No murmur heard.    No friction rub. No gallop.  Pulmonary:     Effort: Pulmonary effort is normal. No respiratory distress.     Breath sounds: Normal breath sounds. No wheezing or rales.  Chest:     Chest wall: No tenderness.  Abdominal:     General: Bowel sounds are normal.     Palpations: Abdomen is soft.     Tenderness: There is no abdominal  tenderness.  Musculoskeletal:     Cervical back: Neck supple.  Lymphadenopathy:     Cervical: No cervical adenopathy.  Skin:    General: Skin is warm and dry.     Findings: No rash.  Neurological:     Mental Status: He is alert and oriented to person, place, and time.  Psychiatric:        Behavior: Behavior normal.        Thought Content: Thought content normal.        Judgment: Judgment normal.         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   RETINITIS 07/11/2010   ALLERGIES-SEASONAL 05/07/2010   CANDIDIASIS, ORAL 08/21/2009   ABNORMAL FINDINGS GI TRACT 01/13/2009   HYPOKALEMIA 12/25/2008   RECTAL BLEEDING 12/25/2008   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 Encouraged to continue monitoring blood pressure at home as able. No neurological or ophthalmologic signs/symptoms. Continue current dose of lisinopril.       Relevant Medications   lisinopril (ZESTRIL) 40 MG tablet     Other   Human immunodeficiency virus (HIV) disease (HCC) - Primary    Mr. [name] continues to have well  controlled virus with good adherence and tolerance to Prezcobix, Tivicay, and Descovy. Reviewed previous lab work and discussed plan of care and U equals U. Check lab work. Continue current dose of Prezcobix, Tivicay and Descovy. See financial team to apply for Medicaid. Plan for follow up in 6 months or sooner if needed with lab work on the same day.       Relevant Medications   darunavir-cobicistat (PREZCOBIX) 800-150 MG tablet   emtricitabine-tenofovir AF (DESCOVY) 200-25  MG tablet   dolutegravir (TIVICAY) 50 MG tablet   Other Relevant Orders   COMPLETE METABOLIC PANEL WITH GFR   HIV-1 RNA quant-no reflex-bld   T-helper cell (CD4)- ([facility] clinic only)   Health care maintenance    Discussed importance of safe sexual practice and condom use. Condoms and STD testing offered.  Due for colonoscopy and will await Medicaid coverage otherwise will need [place] financial assistance. Dental care up to date.        Other Visit Diagnoses     Pharmacologic therapy       Relevant Orders   Lipid Profile        I am having Taven T. Sesma start on Prezcobix. I am also having him maintain his ibuprofen, Acetaminophen (TYLENOL PO), valACYclovir, lisinopril, Descovy, and Tivicay.   Meds ordered this encounter  Medications   darunavir-cobicistat (PREZCOBIX) 800-150 MG tablet    Sig: Take 1 tablet by mouth daily with breakfast. Swallow whole. Do NOT crush, break or chew tablets. Take with food.    Dispense:  30 tablet    Refill:  5    Order Specific Question:   Supervising Provider    Answer:   Drue Second, CYNTHIA [[id number]]   lisinopril (ZESTRIL) 40 MG tablet    Sig: TAKE 1 TABLET(40 MG) BY MOUTH DAILY    Dispense:  30 tablet    Refill:  6    Order Specific Question:   Supervising Provider    Answer:   Drue Second, CYNTHIA [[id number]]   DISCONTD: dolutegravir (TIVICAY) 50 MG tablet    Sig: TAKE 1 TABLET(50 MG) BY MOUTH DAILY    Dispense:  30 tablet    Refill:  5    Discontinue Prezcobix and  continue Tivicay and Descovy.    Order Specific Question:   Supervising Provider    Answer:   Judyann Munson [[id number]]   emtricitabine-tenofovir AF (DESCOVY) 200-25 MG tablet    Sig: Take 1 tablet by mouth daily.    Dispense:  30 tablet    Refill:  5    Order Specific Question:   Supervising Provider    Answer:   Drue Second, CYNTHIA [[id number]]   dolutegravir (TIVICAY) 50 MG tablet    Sig: TAKE 1 TABLET(50 MG) BY MOUTH DAILY    Dispense:  30 tablet    Refill:  5    Please disregard previous message and continue with Tivicay and Prezcobix in addition to Descovy.    Order Specific Question:   Supervising Provider    Answer:   Judyann Munson [[id number]]     Follow-up: Return in about 6 months (around 10/29/2023).   Marcos Eke, MSN, FNP-C Nurse Practitioner [facility] Main number: [phone]

## 2023-04-29 NOTE — Assessment & Plan Note (Signed)
[name] continues to have well controlled virus with good adherence and tolerance to Prezcobix, Tivicay, and Descovy. Reviewed previous lab work and discussed plan of care and U equals U. Check lab work. Continue current dose of Prezcobix, Tivicay and Descovy. See financial team to apply for Medicaid. Plan for follow up in 6 months or sooner if needed with lab work on the same day.

## 2023-04-29 NOTE — Assessment & Plan Note (Signed)
Blood pressure mildly elevated. Encouraged to continue monitoring blood pressure at home as able. No neurological or ophthalmologic signs/symptoms. Continue current dose of lisinopril.

## 2023-04-30 LAB — COMPLETE METABOLIC PANEL WITH GFR
ALT: 14 U/L (ref 9–46)
BUN: 11 mg/dL (ref 7–25)
Calcium: 9.6 mg/dL (ref 8.6–10.3)
Chloride: 108 mmol/L (ref 98–110)
Sodium: 141 mmol/L (ref 135–146)

## 2023-04-30 LAB — T-HELPER CELL (CD4) - (RCID CLINIC ONLY)
CD4 % Helper T Cell: 16 % — ABNORMAL LOW (ref 33–65)
CD4 T Cell Abs: 294 /uL — ABNORMAL LOW (ref 400–1790)

## 2023-05-02 LAB — COMPLETE METABOLIC PANEL WITH GFR
AG Ratio: 1.8 (calc) (ref 1.0–2.5)
AST: 14 U/L (ref 10–35)
Albumin: 4.6 g/dL (ref 3.6–5.1)
Alkaline phosphatase (APISO): 69 U/L (ref 35–144)
CO2: 22 mmol/L (ref 20–32)
Creat: 0.81 mg/dL (ref 0.70–1.30)
Globulin: 2.5 g/dL (calc) (ref 1.9–3.7)
Glucose, Bld: 88 mg/dL (ref 65–99)
Potassium: 3.8 mmol/L (ref 3.5–5.3)
Total Bilirubin: 0.5 mg/dL (ref 0.2–1.2)
Total Protein: 7.1 g/dL (ref 6.1–8.1)
eGFR: 103 mL/min/{1.73_m2} (ref >=60)

## 2023-05-02 LAB — LIPID PANEL
Cholesterol: 166 mg/dL (ref <200)
HDL: 42 mg/dL (ref >=40)
LDL Cholesterol (Calc): 97 mg/dL (calc)
Non-HDL Cholesterol (Calc): 124 mg/dL (calc) (ref <130)
Total CHOL/HDL Ratio: 4 (calc) (ref <5.0)
Triglycerides: 173 mg/dL — ABNORMAL HIGH (ref <150)

## 2023-05-02 LAB — HIV-1 RNA QUANT-NO REFLEX-BLD
HIV 1 RNA Quant: 82 Copies/mL — ABNORMAL HIGH
HIV-1 RNA Quant, Log: 1.91 Log cps/mL — ABNORMAL HIGH

## 2023-05-06 ENCOUNTER — Telehealth: Payer: Self-pay

## 2023-05-06 NOTE — Telephone Encounter (Signed)
-----   Message from Veryl Speak, FNP sent at 05/05/2023  5:12 PM EDT ----- Please inform [name] that lab work looks good with undetectable viral load and CD4 count 294. Kidney function, liver function and electrolytes look good. There is increased risk for heart disease and would recommend starting an a cholesterol medication like Crestor which will help improve cholesterol and reduce inflammation associated with HIV. Please let me know if interested and we can send in. Thanks!

## 2023-05-06 NOTE — Telephone Encounter (Signed)
Left voicemail asking patient to return my call.   Candice Tobey P Gilad Dugger, CMA  

## 2023-05-07 MED ORDER — ROSUVASTATIN CALCIUM 10 MG PO TABS: 10.0000 mg | ORAL_TABLET | Freq: Every day | ORAL | 5 refills | Status: DC

## 2023-05-07 NOTE — Telephone Encounter (Signed)
Jamisen called back, relayed that viral load undetectable and CD4 count 294, kidney & liver function and electrolytes were normal.   We discussed elevated risk of cardiovascular events and the findings of the REPRIEVE study. Jacoby is keen to do anything that will benefit his health and would like the statin sent in. Asked Krystle to notify us if he notices any side effects, especially muscle pain. Patient verbalized understanding and has no further questions.    Sandie Ano, RN

## 2023-05-07 NOTE — Telephone Encounter (Signed)
Crestor sent to pharmacy. Thanks!

## 2023-05-07 NOTE — Addendum Note (Signed)
Addended by: Jeanine Luz D on: 05/07/2023 01:26 PM   Modules accepted: Orders

## 2023-06-18 ENCOUNTER — Other Ambulatory Visit: Payer: Self-pay | Admitting: Family

## 2023-06-18 DIAGNOSIS — B009 Herpesviral infection, unspecified: Secondary | ICD-10-CM

## 2023-10-06 ENCOUNTER — Other Ambulatory Visit: Payer: Self-pay | Admitting: Family

## 2023-10-06 DIAGNOSIS — B009 Herpesviral infection, unspecified: Secondary | ICD-10-CM

## 2023-10-23 ENCOUNTER — Ambulatory Visit: Payer: Self-pay | Admitting: Family

## 2023-11-07 ENCOUNTER — Other Ambulatory Visit: Payer: Self-pay | Admitting: Family

## 2023-11-07 DIAGNOSIS — B20 Human immunodeficiency virus [HIV] disease: Secondary | ICD-10-CM

## 2023-11-17 ENCOUNTER — Ambulatory Visit: Payer: Medicaid Other | Admitting: Family

## 2023-12-08 ENCOUNTER — Other Ambulatory Visit: Payer: Self-pay | Admitting: Family

## 2023-12-08 DIAGNOSIS — B20 Human immunodeficiency virus [HIV] disease: Secondary | ICD-10-CM

## 2023-12-08 DIAGNOSIS — I10 Essential (primary) hypertension: Secondary | ICD-10-CM

## 2023-12-17 ENCOUNTER — Ambulatory Visit (INDEPENDENT_AMBULATORY_CARE_PROVIDER_SITE_OTHER): Payer: Self-pay | Admitting: Family

## 2023-12-17 ENCOUNTER — Other Ambulatory Visit: Payer: Self-pay

## 2023-12-17 ENCOUNTER — Encounter: Payer: Self-pay | Admitting: Family

## 2023-12-17 VITALS — BP 144/78 | HR 90 | Temp 99.0°F | Wt 232.0 lb

## 2023-12-17 DIAGNOSIS — Z9189 Other specified personal risk factors, not elsewhere classified: Secondary | ICD-10-CM

## 2023-12-17 DIAGNOSIS — Z0000 Encounter for general adult medical examination without abnormal findings: Secondary | ICD-10-CM

## 2023-12-17 DIAGNOSIS — Z79899 Other long term (current) drug therapy: Secondary | ICD-10-CM

## 2023-12-17 DIAGNOSIS — B20 Human immunodeficiency virus [HIV] disease: Secondary | ICD-10-CM

## 2023-12-17 MED ORDER — TIVICAY 50 MG PO TABS: ORAL_TABLET | ORAL | 5 refills | Status: DC

## 2023-12-17 MED ORDER — DESCOVY 200-25 MG PO TABS: 1.0000 | ORAL_TABLET | Freq: Every day | ORAL | 5 refills | Status: DC

## 2023-12-17 MED ORDER — PREZCOBIX 800-150 MG PO TABS: 1.0000 | ORAL_TABLET | Freq: Every day | ORAL | 5 refills | Status: DC

## 2023-12-17 NOTE — Assessment & Plan Note (Signed)
[name] continues to have well-controlled virus with good adherence and tolerance to Tivicay/Prezcobix/Descovy.  Reviewed previous lab work and discussed plan of care and U equals U.  Check blood work.  Continue current dose of Tivicay/Descovy/Prezcobix.  Plan for follow-up in 6 months or sooner if needed.

## 2023-12-17 NOTE — Progress Notes (Signed)
Brief Narrative   Patient ID: [name], male    DOB: 29-May-1966, 58 y.o.   MRN: [id number]  Mr. [name] is a 58 y/o male diagnosed with HIV disease in March 2007 with risk factor of heterosexual contact. Initial viral load was 546,000 with CD4 count 10. Genosure with M18V, K103N, G190A, L210W, K70E, 155H/N. Entered care at [facility] Stage 3. ART experienced with Triumeq, Atazanavir/ritonivir, Truvada, Raltegravir, Tivicay, Descovy, and Prezcobix.   Subjective:    Chief Complaint  Patient presents with   Follow-up    HPI:  [name] is a 58 y.o. male with HIV disease last seen on 04/29/2023 with well-controlled virus and good adherence and tolerance to Tivicay, Prezcobix, and Descovy.  Viral load was 82 with CD4 count 294.  Kidney function, liver function, electrolytes within normal ranges.  Lipid profile with triglycerides 173, LDL 97, and HDL 42.  Here today for routine follow-up.  [name] has been doing well since his last office visit and continues to take Tivicay, Prezcobix, and Descovy as prescribed with no adverse side effects or problems obtaining medication from the pharmacy.  Renewed financial assistance today.  No new concerns/complaints.  Condoms and site-specific STD testing offered.  Due for colon cancer screening through colonoscopy.  Vaccinations reviewed and declined.  Due for routine dental care.  Denies fevers, chills, night sweats, headaches, changes in vision, neck pain/stiffness, nausea, diarrhea, vomiting, lesions or rashes.  Lab Results  Component Value Date   CD4TCELL 16 (L) 04/29/2023   CD4TABS 294 (L) 04/29/2023   Lab Results  Component Value Date   HIV1RNAQUANT 82 (H) 04/29/2023     Allergies  Allergen Reactions   Apple Juice Swelling    Apple peelings     Outpatient Medications Prior to Visit  Medication Sig Dispense Refill   Acetaminophen (TYLENOL PO) Take by mouth.     ibuprofen (ADVIL) 600 MG tablet Take 1 tablet (600 mg total)  by mouth every 6 (six) hours as needed. 30 tablet 0   lisinopril (ZESTRIL) 40 MG tablet TAKE 1 TABLET(40 MG) BY MOUTH DAILY 30 tablet 0   rosuvastatin (CRESTOR) 10 MG tablet TAKE 1 TABLET(10 MG) BY MOUTH DAILY 30 tablet 5   valACYclovir (VALTREX) 500 MG tablet TAKE 1 TABLET(500 MG) BY MOUTH TWICE DAILY 60 tablet 2   DESCOVY 200-25 MG tablet TAKE 1 TABLET BY MOUTH DAILY 30 tablet 0   dolutegravir (TIVICAY) 50 MG tablet TAKE 1 TABLET(50 MG) BY MOUTH DAILY 30 tablet 5   PREZCOBIX 800-150 MG tablet TAKE 1 TABLET BY MOUTH DAILY WITH BREAKFAST. SWALLOW WHOLE. DO NOT CRUSH, BREAK OR CHEW TABLETS. TAKE WITH FOOD 30 tablet 0   No facility-administered medications prior to visit.     Past Medical History:  Diagnosis Date   Abnormal finding on GI tract imaging    Allergy    Candidiasis    Hepatitis C    Herpes iris    HIV disease (HCC)    Hypertension    Hypokalemia    Plantar nerve lesion    Rectal bleeding    Retinitis    Syncope    Ulcer    Venereal wart      History reviewed. No pertinent surgical history.    Review of Systems  Constitutional:  Negative for appetite change, chills, fatigue, fever and unexpected weight change.  Eyes:  Negative for visual disturbance.  Respiratory:  Negative for cough, chest tightness, shortness of breath and wheezing.  Cardiovascular:  Negative for chest pain and leg swelling.  Gastrointestinal:  Negative for abdominal pain, constipation, diarrhea, nausea and vomiting.  Genitourinary:  Negative for dysuria, flank pain, frequency, genital sores, hematuria and urgency.  Skin:  Negative for rash.  Allergic/Immunologic: Negative for immunocompromised state.  Neurological:  Negative for dizziness and headaches.      Objective:    BP (!) 144/78   Pulse 90   Temp 99 F (37.2 C) (Oral)   Wt 232 lb (105.2 kg)   SpO2 96%   BMI 31.46 kg/m  Nursing note and vital signs reviewed.  Physical Exam Constitutional:      General: He is not in acute  distress.    Appearance: He is well-developed.  Eyes:     Conjunctiva/sclera: Conjunctivae normal.  Cardiovascular:     Rate and Rhythm: Normal rate and regular rhythm.     Heart sounds: Normal heart sounds. No murmur heard.    No friction rub. No gallop.  Pulmonary:     Effort: Pulmonary effort is normal. No respiratory distress.     Breath sounds: Normal breath sounds. No wheezing or rales.  Chest:     Chest wall: No tenderness.  Abdominal:     General: Bowel sounds are normal.     Palpations: Abdomen is soft.     Tenderness: There is no abdominal tenderness.  Musculoskeletal:     Cervical back: Neck supple.  Lymphadenopathy:     Cervical: No cervical adenopathy.  Skin:    General: Skin is warm and dry.     Findings: No rash.  Neurological:     Mental Status: He is alert and oriented to person, place, and time.  Psychiatric:        Behavior: Behavior normal.        Thought Content: Thought content normal.        Judgment: Judgment normal.         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   Allergic rhinitis 05/07/2010   CANDIDIASIS, ORAL 08/21/2009   ABNORMAL FINDINGS GI TRACT 01/13/2009   HYPOKALEMIA 12/25/2008   RECTAL BLEEDING 12/25/2008   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  Reviewed previous lab work and discussed plan of care and U equals U.  Check blood work.  Continue current dose of Tivicay/Descovy/Prezcobix.  Plan  for follow-up in 6 months or sooner if needed.       Relevant Medications   emtricitabine-tenofovir AF (DESCOVY) 200-25 MG tablet   dolutegravir (TIVICAY) 50 MG tablet   darunavir-cobicistat (PREZCOBIX) 800-150 MG tablet   Other Relevant Orders   COMPLETE METABOLIC PANEL WITH GFR   HIV-1 RNA quant-no reflex-bld   T-helper cell (CD4)- ([facility] clinic only)   Health care maintenance   Discussed importance of safe sexual practice and condom use. Condoms and site specific STD testing offered.  Vaccinations reviewed and declined.  Due for colon cancer screening with referral placed to GI.  Due for routine dental care and can refer to [facility] if needed.       Other Visit Diagnoses       At risk for colon cancer    -  Primary   Relevant Orders   Ambulatory referral to Gastroenterology     Pharmacologic therapy       Relevant Orders   Lipid panel        I have changed [name]'s Descovy and Prezcobix. I am also having him maintain his ibuprofen, Acetaminophen (TYLENOL PO), valACYclovir, rosuvastatin, lisinopril, and Tivicay.   Meds ordered this encounter  Medications   emtricitabine-tenofovir AF (DESCOVY) 200-25 MG tablet    Sig: Take 1 tablet by mouth daily.    Dispense:  30 tablet    Refill:  5    Supervising Provider:   Judyann Munson [phone]    Prescription Type::   Renewal   dolutegravir (TIVICAY) 50 MG tablet    Sig: TAKE 1 TABLET(50 MG) BY MOUTH DAILY    Dispense:  30 tablet    Refill:  5    Supervising  Provider:   Judyann Munson [phone]    Prescription Type::   Renewal   darunavir-cobicistat (PREZCOBIX) 800-150 MG tablet    Sig: Take 1 tablet by mouth daily with breakfast. Swallow whole. Do NOT crush, break or chew tablets. Take with food.    Dispense:  30 tablet    Refill:  5    Supervising Provider:   Judyann Munson [phone]    Prescription Type::   Renewal     Follow-up: Return in about 6 months (around 06/15/2024), or if symptoms worsen or fail to improve. or sooner if needed.    Marcos Eke, MSN, FNP-C Nurse Practitioner [facility] Main number: [phone]

## 2023-12-17 NOTE — Assessment & Plan Note (Signed)
Discussed importance of safe sexual practice and condom use. Condoms and site specific STD testing offered.  Vaccinations reviewed and declined.  Due for colon cancer screening with referral placed to GI.  Due for routine dental care and can refer to [facility] if needed.

## 2023-12-17 NOTE — Patient Instructions (Addendum)
Nice to see you. ? ?We will check your lab work today. ? ?Continue to take your medication daily as prescribed. ? ?Refills have been sent to the pharmacy. ? ?Plan for follow up in 6 months or sooner if needed with lab work on the same day. ? ?Have a great day and stay safe! ? ?

## 2023-12-18 LAB — T-HELPER CELL (CD4) - (RCID CLINIC ONLY)
CD4 % Helper T Cell: 19 % — ABNORMAL LOW (ref 33–65)
CD4 T Cell Abs: 297 /uL — ABNORMAL LOW (ref 400–1790)

## 2023-12-19 LAB — COMPLETE METABOLIC PANEL WITH GFR
AG Ratio: 2 (calc) (ref 1.0–2.5)
ALT: 20 U/L (ref 9–46)
AST: 18 U/L (ref 10–35)
Albumin: 4.7 g/dL (ref 3.6–5.1)
Alkaline phosphatase (APISO): 65 U/L (ref 35–144)
BUN: 9 mg/dL (ref 7–25)
CO2: 25 mmol/L (ref 20–32)
Calcium: 9.5 mg/dL (ref 8.6–10.3)
Chloride: 109 mmol/L (ref 98–110)
Creat: 0.86 mg/dL (ref 0.70–1.30)
Globulin: 2.4 g/dL (ref 1.9–3.7)
Glucose, Bld: 97 mg/dL (ref 65–99)
Potassium: 3.8 mmol/L (ref 3.5–5.3)
Sodium: 142 mmol/L (ref 135–146)
Total Bilirubin: 0.4 mg/dL (ref 0.2–1.2)
Total Protein: 7.1 g/dL (ref 6.1–8.1)
eGFR: 101 mL/min/{1.73_m2} (ref >=60)

## 2023-12-19 LAB — LIPID PANEL
Cholesterol: 132 mg/dL (ref <200)
HDL: 45 mg/dL (ref >=40)
LDL Cholesterol (Calc): 67 mg/dL
Non-HDL Cholesterol (Calc): 87 mg/dL (ref <130)
Total CHOL/HDL Ratio: 2.9 (calc) (ref <5.0)
Triglycerides: 115 mg/dL (ref <150)

## 2023-12-19 LAB — HIV-1 RNA QUANT-NO REFLEX-BLD
HIV 1 RNA Quant: 43 {copies}/mL — ABNORMAL HIGH
HIV-1 RNA Quant, Log: 1.63 {Log} — ABNORMAL HIGH

## 2023-12-22 ENCOUNTER — Telehealth: Payer: Self-pay

## 2023-12-22 NOTE — Telephone Encounter (Signed)
Left vm with pt informing him labs were good and to call back with any questions.  Juanita Laster, RMA

## 2023-12-22 NOTE — Telephone Encounter (Signed)
-----   Message from Jeanine Luz sent at 12/22/2023  3:34 PM EST ----- See other note

## 2024-02-01 ENCOUNTER — Other Ambulatory Visit: Payer: Self-pay | Admitting: Family

## 2024-02-01 DIAGNOSIS — B009 Herpesviral infection, unspecified: Secondary | ICD-10-CM

## 2024-02-01 DIAGNOSIS — I10 Essential (primary) hypertension: Secondary | ICD-10-CM

## 2024-02-01 DIAGNOSIS — B20 Human immunodeficiency virus [HIV] disease: Secondary | ICD-10-CM

## 2024-02-02 ENCOUNTER — Other Ambulatory Visit: Payer: Self-pay

## 2024-02-02 ENCOUNTER — Telehealth: Payer: Self-pay

## 2024-02-02 DIAGNOSIS — B20 Human immunodeficiency virus [HIV] disease: Secondary | ICD-10-CM

## 2024-02-02 MED ORDER — TIVICAY 50 MG PO TABS: ORAL_TABLET | ORAL | 5 refills | Status: DC

## 2024-02-02 MED ORDER — DESCOVY 200-25 MG PO TABS: 1.0000 | ORAL_TABLET | Freq: Every day | ORAL | 5 refills | Status: DC

## 2024-02-02 NOTE — Telephone Encounter (Signed)
 Called patient to see if he needs any refills on Tivicay or Descovy. Sent in refill request for prezcobix, lisinopril, and valacyclovir to [facility] on [address] today.

## 2024-03-31 NOTE — Progress Notes (Signed)
 The 10-year ASCVD risk score (Arnett DK, et al., 2019) is: 14%   Values used to calculate the score:     Age: 58 years     Sex: Male     Is Non-Hispanic African American: Yes     Diabetic: No     Tobacco smoker: No     Systolic Blood Pressure: 144 mmHg     Is BP treated: Yes     HDL Cholesterol: 45 mg/dL     Total Cholesterol: 132 mg/dL  Currently prescribed rosuvastatin  10 mg.   Shareena Nusz, BSN, RN

## 2024-04-08 ENCOUNTER — Encounter (HOSPITAL_COMMUNITY): Payer: Self-pay | Admitting: *Deleted

## 2024-04-08 ENCOUNTER — Ambulatory Visit (HOSPITAL_COMMUNITY)
Admission: EM | Admit: 2024-04-08 | Discharge: 2024-04-08 | Disposition: A | Discharge status: Home / Self Care | Attending: Internal Medicine | Admitting: Internal Medicine

## 2024-04-08 DIAGNOSIS — H6123 Impacted cerumen, bilateral: Secondary | ICD-10-CM

## 2024-04-08 DIAGNOSIS — H65112 Acute and subacute allergic otitis media (mucoid) (sanguinous) (serous), left ear: Secondary | ICD-10-CM

## 2024-04-08 DIAGNOSIS — K047 Periapical abscess without sinus: Secondary | ICD-10-CM

## 2024-04-08 MED ORDER — PENICILLIN V POTASSIUM 500 MG PO TABS: 500.0000 mg | ORAL_TABLET | Freq: Four times a day (QID) | ORAL | 0 refills | Status: AC

## 2024-04-08 MED ORDER — FEXOFENADINE HCL 180 MG PO TABS: 180.0000 mg | ORAL_TABLET | Freq: Every day | ORAL | 0 refills | Status: AC

## 2024-04-08 NOTE — ED Notes (Signed)
 After lowering the exam table post bilateral ear wash pt states he feels like he might vomit he was given a emesis bag.

## 2024-04-08 NOTE — ED Provider Notes (Signed)
 [facility]    CSN: [id number] Arrival date & time: 04/08/24  1622      History   Chief Complaint Chief Complaint  Patient presents with   Ear Fullness   Dental Pain    HPI [name] is a 58 y.o. male presents with  feeling his ears are full L<R and usually is due to ear was and has to have it washed out every 2 years.  Has been having a lot of allergy symptoms.  Also has been having pain of L lower molar area where the molar is chipped off x 4 days. Has not seen a dentist for this. Denies a fever.     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 No pertinent surgical history.     Home Medications    Prior to Admission medications   Medication Sig Start Date End Date Taking? Authorizing Provider  Acetaminophen  (TYLENOL  PO) Take by mouth.   Yes [provider]  dolutegravir  (TIVICAY ) 50 MG tablet TAKE 1 TABLET(50 MG) BY  MOUTH DAILY 02/02/24  Yes Calone, Gregory D, FNP  emtricitabine -tenofovir  AF (DESCOVY ) 200-25 MG tablet Take 1 tablet by mouth daily. 02/02/24  Yes Calone, Gregory D, FNP  fexofenadine (ALLEGRA ALLERGY) 180 MG tablet Take 1 tablet (180 mg total) by mouth daily. 04/08/24  Yes Rodriguez-Southworth, Dajanae Brophy, PA-C  lisinopril  (ZESTRIL ) 40 MG tablet TAKE 1 TABLET(40 MG) BY MOUTH DAILY 02/02/24  Yes Calone, Gregory D, FNP  penicillin v potassium (VEETID) 500 MG tablet Take 1 tablet (500 mg total) by mouth 4 (four) times daily for 7 days. 04/08/24 04/15/24 Yes Rodriguez-Southworth, Lamond Pilot, PA-C  PREZCOBIX  800-150 MG tablet TAKE 1 TABLET BY MOUTH DAILY WITH BREAKFAST. SWALLOW WHOLE. DO NOT CRUSH, BREAK OR CHEW TABLETS. TAKE WITH FOOD 02/02/24  Yes Calone, Gregory D, FNP  rosuvastatin  (CRESTOR ) 10 MG tablet TAKE 1 TABLET(10 MG) BY MOUTH DAILY 11/10/23  Yes Calone, Gregory D, FNP  ibuprofen  (ADVIL ) 600 MG tablet Take 1 tablet (600 mg total) by mouth every 6 (six) hours as needed. 04/15/20   Mandy Second, PA-C  valACYclovir  (VALTREX ) 500 MG tablet TAKE 1 TABLET(500 MG) BY MOUTH TWICE DAILY 02/02/24   Calone, Gregory D, FNP  potassium chloride  (K-DUR) 10 MEQ tablet Take 1 tablet (10 mEq total) by mouth once. Repeat labs at [facility]  the next day Patient not taking: Reported on 01/31/2016 10/10/15 02/10/16  Sandie Cross, MD    Family History Family History  Problem Relation Age of Onset   Hypertension Sister    Hypertension Brother    Cancer Mother        pancreatic cancer    Social History Social History   Tobacco Use   Smoking status: Never   Smokeless tobacco: Never  Vaping Use   Vaping status: Never Used  Substance Use Topics   Alcohol use: No   Drug use: No     Allergies   Apple juice   Review of Systems Review of Systems  As noted in HPI Physical Exam Triage Vital Signs ED Triage Vitals  Encounter Vitals Group     BP 04/08/24 1745 (!) 153/103     Systolic BP Percentile --       Diastolic BP Percentile --      Pulse Rate 04/08/24 1745 80     Resp 04/08/24 1745 18     Temp 04/08/24 1745 98.3 F (36.8 C)     Temp Source 04/08/24 1745 Oral     SpO2 04/08/24 1745 95 %     Weight --      Height --      Head Circumference --      Peak Flow --      Pain Score 04/08/24 1743 9     Pain Loc --      Pain Education --      Exclude from Growth Chart --    No data found.  Updated Vital Signs BP (!) 153/103 (BP Location: Right Arm)   Pulse 80   Temp 98.3 F (36.8 C) (Oral)   Resp 18   SpO2 95%   Visual Acuity Right Eye Distance:   Left Eye Distance:   Bilateral Distance:    Right Eye Near:   Left Eye Near:    Bilateral Near:     Physical Exam Constitutional:      General: He is not in acute distress.    Appearance: He is not toxic-appearing.  HENT:     Right Ear: External ear normal.     Left Ear: External ear normal.     Ears:     Comments: There is partial cerumen impaction in both ears.  L TM is pink, dull and flat. R TM is gray and dull.     Mouth/Throat:   Eyes:     General: No scleral icterus.    Conjunctiva/sclera: Conjunctivae normal.  Pulmonary:     Effort: Pulmonary effort is normal.  Musculoskeletal:        General: Normal range of motion.     Cervical back: Neck supple.  Lymphadenopathy:     Cervical: No cervical adenopathy.  Skin:    General: Skin is warm and dry.  Neurological:     Mental Status: He is alert and oriented to person, place, and time.     Gait: Gait normal.  Psychiatric:        Mood and Affect: Mood normal.        Behavior: Behavior normal.        Thought Content: Thought content normal.        Judgment: Judgment normal.      UC Treatments / Results  Labs (all labs ordered are listed, but only abnormal results are displayed) Labs Reviewed - No data to display  EKG   Radiology No results found.  Procedures Procedures (including critical care time)  Medications Ordered in UC Medications - No  data to display  Initial Impression / Assessment and Plan / UC Course  I have reviewed the triage vital signs and the nursing notes. Had partial bilateral cerumen impaction but resolved after lavage, and has L SOM Also has infected L lower molar  He was placed on Allegra and Penicillin as noted He was given information about dentist he can call to have dental care.      Final Clinical Impressions(s) / UC Diagnoses   Final diagnoses:  Bilateral impacted cerumen  Acute allergic serous otitis media, left  Dental infection     Discharge Instructions      Please make sure to follow up with a dentist    ED Prescriptions     Medication Sig Dispense Auth. Provider   fexofenadine (ALLEGRA ALLERGY) 180 MG tablet Take 1 tablet (180 mg total) by mouth daily. 30 tablet Rodriguez-Southworth, Rondale Nies, PA-C   penicillin v potassium (VEETID) 500 MG tablet Take 1 tablet (500 mg total) by mouth 4 (four) times daily for 7 days. 28 tablet Rodriguez-Southworth, Lamond Pilot, PA-C      PDMP not reviewed this encounter.   Vonda Guadeloupe, PA-C 04/08/24 1935

## 2024-04-08 NOTE — ED Triage Notes (Signed)
 Pt states he has right sided dental pain X 4 days. He took tylenol .   Pt states he has bilateral ear fullness he has had his ear cleaned out in the past.

## 2024-04-08 NOTE — Discharge Instructions (Signed)
 Please make sure to follow up with a dentist

## 2024-05-11 ENCOUNTER — Other Ambulatory Visit: Payer: Self-pay | Admitting: Family

## 2024-05-11 DIAGNOSIS — B009 Herpesviral infection, unspecified: Secondary | ICD-10-CM

## 2024-06-12 ENCOUNTER — Other Ambulatory Visit: Payer: Self-pay | Admitting: Family

## 2024-06-12 DIAGNOSIS — Z9189 Other specified personal risk factors, not elsewhere classified: Secondary | ICD-10-CM

## 2024-06-12 DIAGNOSIS — B20 Human immunodeficiency virus [HIV] disease: Secondary | ICD-10-CM

## 2024-06-12 DIAGNOSIS — I10 Essential (primary) hypertension: Secondary | ICD-10-CM

## 2024-06-17 ENCOUNTER — Other Ambulatory Visit: Payer: Self-pay | Admitting: Family

## 2024-06-17 DIAGNOSIS — B20 Human immunodeficiency virus [HIV] disease: Secondary | ICD-10-CM

## 2024-06-17 DIAGNOSIS — Z9189 Other specified personal risk factors, not elsewhere classified: Secondary | ICD-10-CM

## 2024-07-13 ENCOUNTER — Other Ambulatory Visit: Payer: Self-pay | Admitting: Family

## 2024-07-13 DIAGNOSIS — Z9189 Other specified personal risk factors, not elsewhere classified: Secondary | ICD-10-CM

## 2024-07-13 DIAGNOSIS — B20 Human immunodeficiency virus [HIV] disease: Secondary | ICD-10-CM

## 2024-07-13 DIAGNOSIS — I10 Essential (primary) hypertension: Secondary | ICD-10-CM

## 2024-07-14 MED ORDER — PREZCOBIX 800-150 MG PO TABS: 1.0000 | ORAL_TABLET | Freq: Every day | ORAL | 0 refills | Status: DC

## 2024-07-20 ENCOUNTER — Other Ambulatory Visit (HOSPITAL_COMMUNITY): Payer: Self-pay

## 2024-07-20 ENCOUNTER — Telehealth: Payer: Self-pay

## 2024-07-20 NOTE — Telephone Encounter (Signed)
 Pharmacy Patient Advocate Encounter  Insurance verification completed.   The patient is insured through [employer]   Ran test claim for Biktarvy . Currently a quantity of 30 is a 30 day supply and the co-pay is 0.00 .   This test claim was processed through [facility]- copay amounts may vary at other pharmacies due to pharmacy/plan contracts, or as the patient moves through the different stages of their insurance plan.

## 2024-08-20 ENCOUNTER — Other Ambulatory Visit: Payer: Self-pay | Admitting: Family

## 2024-08-20 DIAGNOSIS — B009 Herpesviral infection, unspecified: Secondary | ICD-10-CM

## 2024-08-26 ENCOUNTER — Telehealth: Payer: Self-pay | Admitting: Family

## 2024-08-26 ENCOUNTER — Other Ambulatory Visit: Payer: Self-pay | Admitting: Family

## 2024-08-26 DIAGNOSIS — Z9189 Other specified personal risk factors, not elsewhere classified: Secondary | ICD-10-CM

## 2024-08-26 DIAGNOSIS — I10 Essential (primary) hypertension: Secondary | ICD-10-CM

## 2024-08-26 DIAGNOSIS — B20 Human immunodeficiency virus [HIV] disease: Secondary | ICD-10-CM

## 2024-08-26 MED ORDER — PREZCOBIX 800-150 MG PO TABS: 1.0000 | ORAL_TABLET | Freq: Every day | ORAL | 0 refills | Status: DC

## 2024-08-26 NOTE — Telephone Encounter (Signed)
 [facility] called requesting refills on all medications to the [facility] on [address]. Pt is scheduled 10/28 with Cathlyn.

## 2024-08-26 NOTE — Telephone Encounter (Signed)
 Received requests in Rx request inbasket and will route that to patient's provider to advise.   Nashali Ditmer, BSN, RN

## 2024-08-30 ENCOUNTER — Other Ambulatory Visit (HOSPITAL_COMMUNITY): Payer: Self-pay

## 2024-09-14 ENCOUNTER — Other Ambulatory Visit: Payer: Self-pay

## 2024-09-14 ENCOUNTER — Ambulatory Visit (INDEPENDENT_AMBULATORY_CARE_PROVIDER_SITE_OTHER): Admitting: Family

## 2024-09-14 ENCOUNTER — Encounter: Payer: Self-pay | Admitting: Family

## 2024-09-14 VITALS — BP 148/89 | HR 88 | Temp 98.5°F | Wt 229.0 lb

## 2024-09-14 DIAGNOSIS — B20 Human immunodeficiency virus [HIV] disease: Secondary | ICD-10-CM

## 2024-09-14 DIAGNOSIS — Z79899 Other long term (current) drug therapy: Secondary | ICD-10-CM | POA: Diagnosis not present

## 2024-09-14 DIAGNOSIS — Z9189 Other specified personal risk factors, not elsewhere classified: Secondary | ICD-10-CM | POA: Diagnosis not present

## 2024-09-14 DIAGNOSIS — Z0000 Encounter for general adult medical examination without abnormal findings: Secondary | ICD-10-CM

## 2024-09-14 MED ORDER — DESCOVY 200-25 MG PO TABS: 1.0000 | ORAL_TABLET | Freq: Every day | ORAL | 5 refills | Status: AC

## 2024-09-14 MED ORDER — PREZCOBIX 800-150 MG PO TABS: 1.0000 | ORAL_TABLET | Freq: Every day | ORAL | 5 refills | Status: AC

## 2024-09-14 MED ORDER — TIVICAY 50 MG PO TABS: ORAL_TABLET | ORAL | 5 refills | Status: AC

## 2024-09-14 NOTE — Assessment & Plan Note (Signed)
 [name] continues to have well-controlled virus with good adherence and tolerance to Tivicay , Descovy , and Prezcobix .  Reviewed previous lab work and discussed plan of care and U equals U.  No problems obtaining medication from the pharmacy and covered by Medicaid.  Social determinants of health reviewed with no interventions indicated.  Did discuss possibility of changing from Prezcobix  and Descovy  to [facility] along with Tivicay  and would like to continue with medications as is.  Check blood work.  Plan for follow-up in 6 months or sooner if needed with lab work on the same day.

## 2024-09-14 NOTE — Progress Notes (Signed)
 Brief Narrative   Patient ID: [name], male    DOB: 1966/08/08, 58 y.o.   MRN: [id number]  [name] is a 58 y/o male diagnosed with HIV disease in March 2007 with risk factor of heterosexual contact. Initial viral load was 546,000 with CD4 count 10. Genosure with M18V, K103N, G190A, L210W, K70E, 155H/N. Entered care at [facility] [place] Stage 3. ART experienced with Triumeq, Atazanavir /ritonivir, Truvada, Raltegravir , Tivicay , Descovy , and Prezcobix .   Subjective:   Chief Complaint  Patient presents with   Follow-up    B20    HPI:  [name] is a 58 y.o. male with HIV disease last seen on 12/17/2023 with well-controlled virus and good adherence and tolerance to Prezcobix , Descovy , and Tivicay .  Viral load was undetectable with CD4 count 297.  Kidney function, liver function, electrolytes within normal ranges.  Here today for routine follow-up.  [name] been doing well since his last office visit and continues to take Tivicay , Descovy , and Prezcobix  as prescribed with no adverse side effects or problems obtaining medication from the pharmacy and covered by Medicaid.  No new concerns/complaints.  Due for routine dental care.  Housing, transportation, access to food are stable.  Not currently sexually active.  Healthcare maintenance reviewed.  Denies fevers, chills, night sweats, headaches, changes in vision, neck pain/stiffness, nausea, diarrhea, vomiting, lesions or rashes.  Lab Results  Component Value Date   CD4TCELL 19 (L) 12/17/2023   CD4TABS 297 (L) 12/17/2023   Lab Results  Component Value Date   HIV1RNAQUANT 43 (H) 12/17/2023     Allergies  Allergen Reactions   Apple Juice Swelling    Apple peelings      Outpatient Medications Prior to Visit  Medication Sig Dispense Refill   Acetaminophen  (TYLENOL  PO) Take by mouth.     fexofenadine  (ALLEGRA  ALLERGY) 180 MG tablet Take 1 tablet (180 mg total) by mouth daily. 30 tablet 0   ibuprofen  (ADVIL ) 600 MG tablet  Take 1 tablet (600 mg total) by mouth every 6 (six) hours as needed. 30 tablet 0   lisinopril  (ZESTRIL ) 40 MG tablet TAKE 1 TABLET(40 MG) BY MOUTH DAILY. NEED APPOINTMENT FOR REFILL. 30 tablet 0   rosuvastatin  (CRESTOR ) 10 MG tablet TAKE 1 TABLET(10 MG) BY MOUTH DAILY 30 tablet 0   valACYclovir  (VALTREX ) 500 MG tablet TAKE 1 TABLET(500 MG) BY MOUTH TWICE DAILY 60 tablet 2   darunavir -cobicistat  (PREZCOBIX ) 800-150 MG tablet Take 1 tablet by mouth daily with breakfast. Swallow whole. Do NOT crush, break or chew tablets. Take with food. 30 tablet 0   DESCOVY  200-25 MG tablet TAKE 1 TABLET BY MOUTH DAILY 30 tablet 0   TIVICAY  50 MG tablet TAKE 1 TABLET(50 MG) BY MOUTH DAILY 30 tablet 0   No facility-administered medications prior to visit.     Past Medical History:  Diagnosis Date   Abnormal finding on GI tract imaging    Allergy    Candidiasis    Hepatitis C    Herpes iris    HIV disease (HCC)    Hypertension    Hypokalemia    Plantar nerve lesion    Rectal bleeding    Retinitis    Syncope    Ulcer    Venereal wart      History reviewed. No pertinent surgical history.      Review of Systems  Constitutional:  Negative for appetite change, chills, fatigue, fever and unexpected weight change.  Eyes:  Negative for visual disturbance.  Respiratory:  Negative for cough, chest tightness, shortness of breath and wheezing.   Cardiovascular:  Negative for chest pain and leg swelling.  Gastrointestinal:  Negative for abdominal pain, constipation, diarrhea, nausea and vomiting.  Genitourinary:  Negative for dysuria, flank pain, frequency, genital sores, hematuria and urgency.  Skin:  Negative for rash.  Allergic/Immunologic: Negative for immunocompromised state.  Neurological:  Negative for dizziness and headaches.     Objective:   BP (!) 148/89   Pulse 88   Temp 98.5 F (36.9 C) (Oral)   Wt 229 lb (103.9 kg)   SpO2 96%   BMI 31.06 kg/m  Nursing note and vital signs  reviewed.  Physical Exam Constitutional:      General: He is not in acute distress.    Appearance: He is well-developed.  Eyes:     Conjunctiva/sclera: Conjunctivae normal.  Cardiovascular:     Rate and Rhythm: Normal rate and regular rhythm.     Heart sounds: Normal heart sounds. No murmur heard.    No friction rub. No gallop.  Pulmonary:     Effort: Pulmonary effort is normal. No respiratory distress.     Breath sounds: Normal breath sounds. No wheezing or rales.  Chest:     Chest wall: No tenderness.  Abdominal:     General: Bowel sounds are normal.     Palpations: Abdomen is soft.     Tenderness: There is no abdominal tenderness.  Musculoskeletal:     Cervical back: Neck supple.  Lymphadenopathy:     Cervical: No cervical adenopathy.  Skin:    General: Skin is warm and dry.     Findings: No rash.  Neurological:     Mental Status: He is alert and oriented to person, place, and time.  Psychiatric:        Behavior: Behavior normal.        Thought Content: Thought content normal.        Judgment: Judgment normal.          04/29/2023    3:27 PM 12/09/2022    2:22 PM 09/27/2021    2:57 PM 01/17/2021    2:30 PM 07/03/2020    2:40 PM  Depression screen PHQ 2/9  Decreased Interest 0 0 0 0 0  Down, Depressed, Hopeless 0 0 0 0 0  PHQ - 2 Score 0 0 0 0 0         No data to display           The 10-year ASCVD risk score (Arnett DK, et al., 2019) is: 14.7%   Values used to calculate the score:     Age: 84 years     Clincally relevant sex: Male     Is Non-Hispanic African American: Yes     Diabetic: No     Tobacco smoker: No     Systolic Blood Pressure: 148 mmHg     Is BP treated: Yes     HDL Cholesterol: 45 mg/dL     Total Cholesterol: 132 mg/dL      Assessment & Plan:    Patient Active Problem List   Diagnosis Date Noted   High triglycerides 12/14/2018   Health care maintenance 12/14/2018   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   Depression 10/05/2015   Rash  and nonspecific skin eruption 10/06/2013   Dental abscess 06/02/2012   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   Allergic rhinitis 05/07/2010   CANDIDIASIS, ORAL 08/21/2009   ABNORMAL FINDINGS GI TRACT 01/13/2009  HYPOKALEMIA 12/25/2008   RECTAL BLEEDING 12/25/2008   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  Reviewed previous lab work and discussed plan of care and U equals U.  No problems obtaining medication from the pharmacy and covered by Medicaid.  Social determinants of health reviewed with no interventions indicated.  Did discuss possibility of changing from Prezcobix  and Descovy  to [facility] along with Tivicay  and would like to continue with medications as is.  Check blood work.  Plan for follow-up in 6 months or sooner if needed with lab work on the same day.      Relevant Medications   darunavir -cobicistat  (PREZCOBIX ) 800-150 MG tablet   emtricitabine -tenofovir  AF (DESCOVY ) 200-25 MG tablet   dolutegravir  (TIVICAY ) 50 MG tablet   Other Relevant Orders   Comprehensive metabolic panel with GFR   HIV-1 RNA quant-no reflex-bld   T-helper cell (CD4)- ([facility] clinic only)   AMB REFERRAL TO [facility]   Health care maintenance   Not currently sexually active. Vaccinations reviewed and following counseling influenza updated.  Will be due for tetanus and Prevnar 20 at next office visit. Due for colonoscopy for colon cancer screening with referral placed to gastroenterology. Due for  routine dental care with referral placed to [facility]       Other Visit Diagnoses       At risk for colon cancer    -  Primary   Relevant Orders   Ambulatory referral to Gastroenterology     Pharmacologic therapy       Relevant Orders   Lipid panel        I have changed [name]'s Descovy  and Tivicay . I am also having him maintain his ibuprofen , Acetaminophen  (TYLENOL  PO), fexofenadine , valACYclovir , lisinopril , rosuvastatin , and Prezcobix .   Meds ordered this encounter  Medications   darunavir -cobicistat  (PREZCOBIX ) 800-150 MG tablet    Sig: Take 1 tablet by mouth daily with breakfast. Swallow whole. Do NOT crush, break or chew tablets. Take with food.    Dispense:  30 tablet    Refill:  5    Fill Tivicay ,Descovy , and Prezcobix  together    Supervising Provider:   LUIZ CHANNEL [phone]    Prescription Type::   Renewal   emtricitabine -tenofovir  AF (DESCOVY ) 200-25 MG tablet    Sig: Take 1 tablet by mouth daily.    Dispense:  30 tablet    Refill:  5    Fill Tivicay ,Descovy , and Prezcobix  together    Supervising Provider:   LUIZ CHANNEL [phone]    Prescription Type::   Renewal   dolutegravir  (TIVICAY ) 50 MG tablet    Sig: TAKE 1 TABLET(50 MG) BY MOUTH DAILY    Dispense:  30 tablet    Refill:  5    Fill Tivicay ,Descovy , and Prezcobix  together    Supervising Provider:   LUIZ CHANNEL [phone]    Prescription Type::   Renewal     Follow-up: Return in about 6 months (around 03/15/2025). or sooner if needed.    Greg Bradlee Bridgers, MSN, FNP-C  Nurse Practitioner [facility] Main number: [phone]

## 2024-09-14 NOTE — Patient Instructions (Signed)
Nice to see you.  We will check your lab work today.  Continue to take your medication daily as prescribed.  Please call [facility]) to schedule/follow up on your dental care at [phone]  Refills have been sent to the pharmacy.  Plan for follow up in 6 months or sooner if needed with lab work on the same day.  Have a great day and stay safe!

## 2024-09-14 NOTE — Assessment & Plan Note (Signed)
 Not currently sexually active. Vaccinations reviewed and following counseling influenza updated.  Will be due for tetanus and Prevnar 20 at next office visit. Due for colonoscopy for colon cancer screening with referral placed to gastroenterology. Due for routine dental care with referral placed to [facility]

## 2024-09-15 LAB — T-HELPER CELL (CD4) - (RCID CLINIC ONLY)
CD4 % Helper T Cell: 18 % — ABNORMAL LOW (ref 33–65)
CD4 T Cell Abs: 326 /uL — ABNORMAL LOW (ref 400–1790)

## 2024-09-16 LAB — COMPREHENSIVE METABOLIC PANEL WITH GFR
AG Ratio: 1.7 (calc) (ref 1.0–2.5)
ALT: 17 U/L (ref 9–46)
AST: 17 U/L (ref 10–35)
Albumin: 4.7 g/dL (ref 3.6–5.1)
Alkaline phosphatase (APISO): 62 U/L (ref 35–144)
BUN: 15 mg/dL (ref 7–25)
CO2: 25 mmol/L (ref 20–32)
Calcium: 9.4 mg/dL (ref 8.6–10.3)
Chloride: 104 mmol/L (ref 98–110)
Creat: 0.81 mg/dL (ref 0.70–1.30)
Globulin: 2.7 g/dL (ref 1.9–3.7)
Glucose, Bld: 81 mg/dL (ref 65–99)
Potassium: 3.8 mmol/L (ref 3.5–5.3)
Sodium: 139 mmol/L (ref 135–146)
Total Bilirubin: 0.5 mg/dL (ref 0.2–1.2)
Total Protein: 7.4 g/dL (ref 6.1–8.1)
eGFR: 102 mL/min/1.73m2 (ref >=60)

## 2024-09-16 LAB — LIPID PANEL
Cholesterol: 130 mg/dL (ref <200)
HDL: 48 mg/dL (ref >=40)
LDL Cholesterol (Calc): 65 mg/dL
Non-HDL Cholesterol (Calc): 82 mg/dL (ref <130)
Total CHOL/HDL Ratio: 2.7 (calc) (ref <5.0)
Triglycerides: 83 mg/dL (ref <150)

## 2024-09-16 LAB — HIV-1 RNA QUANT-NO REFLEX-BLD
HIV 1 RNA Quant: <20 {copies}/mL — AB
HIV-1 RNA Quant, Log: <1.3 {Log_copies}/mL — AB

## 2024-09-17 ENCOUNTER — Ambulatory Visit: Payer: Self-pay | Admitting: Family

## 2024-09-29 ENCOUNTER — Other Ambulatory Visit: Payer: Self-pay

## 2024-09-29 DIAGNOSIS — B20 Human immunodeficiency virus [HIV] disease: Secondary | ICD-10-CM

## 2024-09-29 DIAGNOSIS — B009 Herpesviral infection, unspecified: Secondary | ICD-10-CM

## 2024-09-29 DIAGNOSIS — Z9189 Other specified personal risk factors, not elsewhere classified: Secondary | ICD-10-CM

## 2024-09-29 DIAGNOSIS — I10 Essential (primary) hypertension: Secondary | ICD-10-CM

## 2024-09-29 MED ORDER — VALACYCLOVIR HCL 500 MG PO TABS: 500.0000 mg | ORAL_TABLET | Freq: Two times a day (BID) | ORAL | 2 refills | Status: AC

## 2024-09-29 MED ORDER — ROSUVASTATIN CALCIUM 10 MG PO TABS: 10.0000 mg | ORAL_TABLET | Freq: Every day | ORAL | 2 refills | Status: DC

## 2024-09-29 MED ORDER — LISINOPRIL 40 MG PO TABS: ORAL_TABLET | ORAL | 2 refills | Status: DC

## 2024-11-04 ENCOUNTER — Other Ambulatory Visit: Payer: Self-pay | Admitting: Family

## 2024-11-04 DIAGNOSIS — Z9189 Other specified personal risk factors, not elsewhere classified: Secondary | ICD-10-CM

## 2024-11-04 DIAGNOSIS — B20 Human immunodeficiency virus [HIV] disease: Secondary | ICD-10-CM

## 2024-11-04 DIAGNOSIS — I10 Essential (primary) hypertension: Secondary | ICD-10-CM

## 2024-11-08 ENCOUNTER — Other Ambulatory Visit: Payer: Self-pay

## 2024-11-08 DIAGNOSIS — Z9189 Other specified personal risk factors, not elsewhere classified: Secondary | ICD-10-CM

## 2024-11-08 DIAGNOSIS — I10 Essential (primary) hypertension: Secondary | ICD-10-CM

## 2024-11-08 DIAGNOSIS — B20 Human immunodeficiency virus [HIV] disease: Secondary | ICD-10-CM

## 2024-11-08 MED ORDER — LISINOPRIL 40 MG PO TABS: ORAL_TABLET | ORAL | 2 refills | Status: AC

## 2024-12-14 ENCOUNTER — Encounter: Payer: Self-pay | Admitting: Gastroenterology

## 2025-03-08 ENCOUNTER — Ambulatory Visit: Admitting: Family
